# Patient Record
Sex: Female | Born: 1995 | Race: Black or African American | Hispanic: No | Marital: Single | Smoking: Former smoker
Health system: Southern US, Community
[De-identification: ages and names within clinical notes are randomized; demographics above are authoritative.]

## PROBLEM LIST (undated history)

## (undated) DIAGNOSIS — B999 Unspecified infectious disease: Secondary | ICD-10-CM

## (undated) DIAGNOSIS — B029 Zoster without complications: Secondary | ICD-10-CM

## (undated) DIAGNOSIS — R519 Headache, unspecified: Secondary | ICD-10-CM

## (undated) DIAGNOSIS — L659 Nonscarring hair loss, unspecified: Secondary | ICD-10-CM

## (undated) DIAGNOSIS — A599 Trichomoniasis, unspecified: Secondary | ICD-10-CM

## (undated) DIAGNOSIS — J45909 Unspecified asthma, uncomplicated: Secondary | ICD-10-CM

## (undated) HISTORY — PX: OTHER SURGICAL HISTORY: SHX169

## (undated) HISTORY — PX: NO PAST SURGERIES: SHX2092

---

## 2019-02-19 ENCOUNTER — Other Ambulatory Visit: Payer: Self-pay

## 2019-02-19 ENCOUNTER — Encounter (HOSPITAL_COMMUNITY): Payer: Self-pay

## 2019-02-19 ENCOUNTER — Ambulatory Visit (HOSPITAL_COMMUNITY)
Admission: EM | Admit: 2019-02-19 | Discharge: 2019-02-19 | Disposition: A | Discharge status: Home / Self Care | Payer: Self-pay | Attending: Family Medicine | Admitting: Family Medicine

## 2019-02-19 DIAGNOSIS — R300 Dysuria: Secondary | ICD-10-CM | POA: Insufficient documentation

## 2019-02-19 DIAGNOSIS — N760 Acute vaginitis: Secondary | ICD-10-CM | POA: Insufficient documentation

## 2019-02-19 DIAGNOSIS — Z760 Encounter for issue of repeat prescription: Secondary | ICD-10-CM | POA: Insufficient documentation

## 2019-02-19 HISTORY — DX: Zoster without complications: B02.9

## 2019-02-19 HISTORY — DX: Unspecified asthma, uncomplicated: J45.909

## 2019-02-19 LAB — POCT URINALYSIS DIP (DEVICE)
Bilirubin Urine: NEGATIVE
Glucose, UA: NEGATIVE mg/dL
Ketones, ur: NEGATIVE mg/dL
Nitrite: NEGATIVE
Protein, ur: NEGATIVE mg/dL
Specific Gravity, Urine: 1.02 (ref 1.005–1.030)
Urobilinogen, UA: 0.2 mg/dL (ref 0.0–1.0)
pH: 7.5 (ref 5.0–8.0)

## 2019-02-19 LAB — POCT PREGNANCY, URINE: Preg Test, Ur: NEGATIVE

## 2019-02-19 MED ORDER — FLUCONAZOLE 150 MG PO TABS: 150.0000 mg | ORAL_TABLET | Freq: Once | ORAL | 0 refills | Status: AC

## 2019-02-19 MED ORDER — NITROFURANTOIN MONOHYD MACRO 100 MG PO CAPS: 100.0000 mg | ORAL_CAPSULE | Freq: Two times a day (BID) | ORAL | 0 refills | Status: AC

## 2019-02-19 MED ORDER — LORATADINE 10 MG PO TABS: 10.0000 mg | ORAL_TABLET | Freq: Every day | ORAL | 0 refills | Status: DC

## 2019-02-19 MED ORDER — MONTELUKAST SODIUM 10 MG PO TABS: 10.0000 mg | ORAL_TABLET | Freq: Every day | ORAL | 0 refills | Status: DC

## 2019-02-19 MED ORDER — VALACYCLOVIR HCL 1 G PO TABS: 1000.0000 mg | ORAL_TABLET | Freq: Three times a day (TID) | ORAL | 0 refills | Status: AC

## 2019-02-19 MED ORDER — ALBUTEROL SULFATE HFA 108 (90 BASE) MCG/ACT IN AERS: 1.0000 | INHALATION_SPRAY | Freq: Four times a day (QID) | RESPIRATORY_TRACT | 0 refills | Status: DC | PRN

## 2019-02-19 NOTE — ED Triage Notes (Signed)
Pt c/o urinary difficulty, burning and frequency x2 days, requesting a preg test also.

## 2019-02-19 NOTE — ED Provider Notes (Signed)
[facility]    CSN: [id number] Arrival date & time: 02/19/19  1324     History   Chief Complaint Chief Complaint  Patient presents with  . Urinary Tract Infection    HPI AMARIA Graham is a 23 y.o. female history of asthma, recurrent shingles, presenting today for evaluation of possible UTI.  Patient states that over the past couple days she has had burning and frequency with urination.  She also has had a lot of irritation noted and some vaginal discharge.  She initially thought her symptoms were from a yeast infection.  She tried a medicated douche which worsened her symptoms.  She also tried Monistat over-the-counter last night.  She has had some mild lower abdominal discomfort that has been off and on recently.  Denies any fever, nausea vomiting or back pain.  Denies history of BV.  Does have concern for STDs.  Last menstrual period was 4/1.  Is not on any form of birth control.  Patient is also requesting medication refills for Valtrex that she states that since she has been pregnant she has had recurrent shingles monthly.  She typically takes Valtrex for this.  She is also requesting refill for her asthma/allergies of albuterol, Claritin and Singulair.  She does not have a PCP.  She is concerned that she is going on a work trip out of town for the next 9 days.  She denies current fevers, chills, body aches, shortness of breath or cough.  HPI  Past Medical History:  Diagnosis Date  . Asthma   . Shingles     There are no active problems to display for this patient.   History reviewed. No pertinent surgical history.  OB History   No obstetric history on file.      Home Medications    Prior to Admission medications   Medication Sig Start Date End Date Taking? Authorizing Provider  albuterol (VENTOLIN HFA) 108 (90 Base) MCG/ACT inhaler Inhale 1-2 puffs into the lungs every 6 (six) hours as needed for wheezing or shortness of breath. 02/19/19   Wieters, Hallie C,  PA-C  fluconazole (DIFLUCAN) 150 MG tablet Take 1 tablet (150 mg total) by mouth once for 1 dose. 02/19/19 02/19/19  Wieters, Hallie C, PA-C  loratadine (CLARITIN) 10 MG tablet Take 1 tablet (10 mg total) by mouth daily. 02/19/19   Wieters, Hallie C, PA-C  montelukast (SINGULAIR) 10 MG tablet Take 1 tablet (10 mg total) by mouth at bedtime. 02/19/19   Wieters, Hallie C, PA-C  nitrofurantoin, macrocrystal-monohydrate, (MACROBID) 100 MG capsule Take 1 capsule (100 mg total) by mouth 2 (two) times daily for 5 days. 02/19/19 02/24/19  Wieters, Hallie C, PA-C  valACYclovir (VALTREX) 1000 MG tablet Take 1 tablet (1,000 mg total) by mouth 3 (three) times daily for 14 days. 02/19/19 03/05/19  Wieters, Junius Creamer, PA-C    Family History No family history on file.  Social History Social History   Tobacco Use  . Smoking status: Current Some Day Smoker  . Smokeless tobacco: Never Used  Substance Use Topics  . Alcohol use: Yes  . Drug use: Not on file     Allergies   Patient has no known allergies.   Review of Systems Review of Systems  Constitutional: Negative for fever.  Respiratory: Negative for shortness of breath.   Cardiovascular: Negative for chest pain.  Gastrointestinal: Positive for abdominal pain. Negative for diarrhea, nausea and vomiting.  Genitourinary: Positive for dysuria and vaginal discharge. Negative for  flank pain, genital sores, hematuria, menstrual problem, vaginal bleeding and vaginal pain.  Musculoskeletal: Negative for back pain.  Skin: Negative for rash.  Neurological: Negative for dizziness, light-headedness and headaches.     Physical Exam Triage Vital Signs ED Triage Vitals  Enc Vitals Group     BP 02/19/19 0945 128/79     Pulse Rate 02/19/19 0945 85     Resp --      Temp 02/19/19 0945 98.1 F (36.7 C)     Temp Source 02/19/19 0945 Oral     SpO2 02/19/19 0945 100 %     Weight --      Height --      Head Circumference --      Peak Flow --      Pain Score  02/19/19 0946 6     Pain Loc --      Pain Edu? --      Excl. in GC? --    No data found.  Updated Vital Signs BP 128/79 (BP Location: Left Arm)   Pulse 85   Temp 98.1 F (36.7 C) (Oral)   LMP 02/03/2019   SpO2 100%   Visual Acuity Right Eye Distance:   Left Eye Distance:   Bilateral Distance:    Right Eye Near:   Left Eye Near:    Bilateral Near:     Physical Exam Vitals signs and nursing note reviewed.  Constitutional:      General: She is not in acute distress.    Appearance: She is well-developed.  HENT:     Head: Normocephalic and atraumatic.  Eyes:     Conjunctiva/sclera: Conjunctivae normal.  Neck:     Musculoskeletal: Neck supple.  Cardiovascular:     Rate and Rhythm: Normal rate and regular rhythm.     Heart sounds: No murmur.  Pulmonary:     Effort: Pulmonary effort is normal. No respiratory distress.     Breath sounds: Normal breath sounds.     Comments: Breathing comfortably at rest, CTABL, no wheezing, rales or other adventitious sounds auscultated Abdominal:     Palpations: Abdomen is soft.     Tenderness: There is no abdominal tenderness.     Comments: Soft, nondistended, striae present, nontender to light and deep palpation throughout all 4 quadrants of abdomen and suprapubic area No CVA tenderness  Skin:    General: Skin is warm and dry.  Neurological:     Mental Status: She is alert.      UC Treatments / Results  Labs (all labs ordered are listed, but only abnormal results are displayed) Labs Reviewed  POCT URINALYSIS DIP (DEVICE) - Abnormal; Notable for the following components:      Result Value   Hgb urine dipstick TRACE (*)    Leukocytes,Ua LARGE (*)    All other components within normal limits  URINE CULTURE  POCT PREGNANCY, URINE  CERVICOVAGINAL ANCILLARY ONLY    EKG None  Radiology No results found.  Procedures Procedures (including critical care time)  Medications Ordered in UC Medications - No data to display   Initial Impression / Assessment and Plan / UC Course  I have reviewed the triage vital signs and the nursing notes.  Pertinent labs & imaging results that were available during my care of the patient were reviewed by me and considered in my medical decision making (see chart for details).     Large leuks on UA, will treat for UTI with Macrobid twice daily x5 days.  Will also provide Diflucan to treat for yeast infection.  Vaginal swab obtained and will send off to confirm causes of discharge as well as check for STDs.  Will call patient with results and alter treatment as needed.  Urine culture obtained.  Refilled patient's Singulair, Claritin, albuterol as well as Valtrex.  Discussed strict return precautions. Patient verbalized understanding and is agreeable with plan.  Final Clinical Impressions(s) / UC Diagnoses   Final diagnoses:  Dysuria  Vaginitis and vulvovaginitis  Medication refill     Discharge Instructions     Please begin taking Macrobid twice daily to treat for UTI.  Will send urine off for culture to confirm and alter antibiotics if needed.  You may take 1 tablet of Diflucan today, may repeat in a few days/after finishing antibiotics if still having symptoms.  We are sending the swab off to check for yeast, BV, gonorrhea, chlamydia and trichomonas.  We will call you with these results.  Please drink plenty of fluids  Follow-up if developing worsening symptoms, persistent symptoms   ED Prescriptions    Medication Sig Dispense Auth. Provider   nitrofurantoin, macrocrystal-monohydrate, (MACROBID) 100 MG capsule Take 1 capsule (100 mg total) by mouth 2 (two) times daily for 5 days. 10 capsule Wieters, Hallie C, PA-C   fluconazole (DIFLUCAN) 150 MG tablet Take 1 tablet (150 mg total) by mouth once for 1 dose. 2 tablet Wieters, Hallie C, PA-C   albuterol (VENTOLIN HFA) 108 (90 Base) MCG/ACT inhaler Inhale 1-2 puffs into the lungs every 6 (six) hours as needed for  wheezing or shortness of breath. 1 Inhaler Wieters, Hallie C, PA-C   montelukast (SINGULAIR) 10 MG tablet Take 1 tablet (10 mg total) by mouth at bedtime. 30 tablet Wieters, Hallie C, PA-C   loratadine (CLARITIN) 10 MG tablet Take 1 tablet (10 mg total) by mouth daily. 30 tablet Wieters, Hallie C, PA-C   valACYclovir (VALTREX) 1000 MG tablet Take 1 tablet (1,000 mg total) by mouth 3 (three) times daily for 14 days. 42 tablet Wieters, [place] C, PA-C     Controlled Substance Prescriptions [place] Controlled Substance Registry consulted? Not Applicable   Lew Dawes, [place] 02/19/19 1039

## 2019-02-19 NOTE — Discharge Instructions (Signed)
Please begin taking Macrobid twice daily to treat for UTI.  Will send urine off for culture to confirm and alter antibiotics if needed.  You may take 1 tablet of Diflucan today, may repeat in a few days/after finishing antibiotics if still having symptoms.  We are sending the swab off to check for yeast, BV, gonorrhea, chlamydia and trichomonas.  We will call you with these results.  Please drink plenty of fluids  Follow-up if developing worsening symptoms, persistent symptoms

## 2019-02-21 LAB — URINE CULTURE: Culture: 50000 — AB

## 2019-02-22 ENCOUNTER — Telehealth (HOSPITAL_COMMUNITY): Payer: Self-pay | Admitting: Emergency Medicine

## 2019-02-22 LAB — CERVICOVAGINAL ANCILLARY ONLY
Bacterial vaginitis: NEGATIVE
Candida vaginitis: POSITIVE — AB
Chlamydia: NEGATIVE
Neisseria Gonorrhea: NEGATIVE
Trichomonas: NEGATIVE

## 2019-02-22 NOTE — Telephone Encounter (Signed)
Urine culture was positive for e coli  and was given macrobid  at urgent care visit.  Attempted to reach patient. No answer at this time. Number not in service

## 2019-07-14 ENCOUNTER — Encounter: Payer: Self-pay | Admitting: Obstetrics

## 2019-07-14 ENCOUNTER — Ambulatory Visit (INDEPENDENT_AMBULATORY_CARE_PROVIDER_SITE_OTHER): Payer: BC Managed Care – PPO | Admitting: *Deleted

## 2019-07-14 ENCOUNTER — Other Ambulatory Visit: Payer: Self-pay

## 2019-07-14 DIAGNOSIS — Z3201 Encounter for pregnancy test, result positive: Secondary | ICD-10-CM

## 2019-07-14 DIAGNOSIS — Z3200 Encounter for pregnancy test, result unknown: Secondary | ICD-10-CM

## 2019-07-14 MED ORDER — ALBUTEROL SULFATE HFA 108 (90 BASE) MCG/ACT IN AERS: 1.0000 | INHALATION_SPRAY | Freq: Four times a day (QID) | RESPIRATORY_TRACT | 0 refills | Status: DC | PRN

## 2019-07-14 NOTE — Progress Notes (Addendum)
[name] presents today for UPT. She has no unusual complaints. LMP:06/04/2019    OBJECTIVE: Appears well, in no apparent distress.  OB History   No obstetric history on file.    Home UPT Result: Positive In-Office UPT result:Positive   ASSESSMENT: Positive pregnancy test  PLAN Prenatal care to be completed at:  [facility] Albuterol inhaler refilled today per Dr Eileen Graham approval.

## 2019-07-14 NOTE — Progress Notes (Signed)
Patient seen and assessed by nursing staff during this encounter. I have reviewed the chart and agree with the documentation and plan.  Mora Bellman, MD 07/14/2019 12:55 PM

## 2019-07-14 NOTE — Addendum Note (Signed)
Addended by: Lewie Loron D on: 07/14/2019 03:34 PM   Modules accepted: Orders

## 2019-07-19 ENCOUNTER — Ambulatory Visit (INDEPENDENT_AMBULATORY_CARE_PROVIDER_SITE_OTHER)
Admission: EM | Admit: 2019-07-19 | Discharge: 2019-07-19 | Disposition: A | Discharge status: Home / Self Care | Payer: BC Managed Care – PPO | Source: Home / Self Care

## 2019-07-19 ENCOUNTER — Emergency Department (HOSPITAL_COMMUNITY)
Admission: EM | Admit: 2019-07-19 | Discharge: 2019-07-19 | Disposition: A | Discharge status: Home / Self Care | Payer: BC Managed Care – PPO

## 2019-07-19 ENCOUNTER — Inpatient Hospital Stay (HOSPITAL_COMMUNITY)
Admission: EM | Admit: 2019-07-19 | Discharge: 2019-07-19 | Disposition: A | Discharge status: Home / Self Care | Payer: BC Managed Care – PPO | Attending: Obstetrics and Gynecology | Admitting: Obstetrics and Gynecology

## 2019-07-19 ENCOUNTER — Encounter (HOSPITAL_COMMUNITY): Payer: Self-pay

## 2019-07-19 ENCOUNTER — Encounter (HOSPITAL_COMMUNITY): Payer: Self-pay | Admitting: *Deleted

## 2019-07-19 ENCOUNTER — Inpatient Hospital Stay (HOSPITAL_COMMUNITY): Payer: BC Managed Care – PPO

## 2019-07-19 ENCOUNTER — Other Ambulatory Visit: Payer: Self-pay

## 2019-07-19 DIAGNOSIS — R1030 Lower abdominal pain, unspecified: Secondary | ICD-10-CM | POA: Diagnosis present

## 2019-07-19 DIAGNOSIS — O99331 Smoking (tobacco) complicating pregnancy, first trimester: Secondary | ICD-10-CM | POA: Diagnosis not present

## 2019-07-19 DIAGNOSIS — F17210 Nicotine dependence, cigarettes, uncomplicated: Secondary | ICD-10-CM | POA: Insufficient documentation

## 2019-07-19 DIAGNOSIS — O99351 Diseases of the nervous system complicating pregnancy, first trimester: Secondary | ICD-10-CM | POA: Diagnosis not present

## 2019-07-19 DIAGNOSIS — N760 Acute vaginitis: Secondary | ICD-10-CM | POA: Diagnosis present

## 2019-07-19 DIAGNOSIS — G4709 Other insomnia: Secondary | ICD-10-CM | POA: Insufficient documentation

## 2019-07-19 DIAGNOSIS — Z3490 Encounter for supervision of normal pregnancy, unspecified, unspecified trimester: Secondary | ICD-10-CM

## 2019-07-19 DIAGNOSIS — Z3A01 Less than 8 weeks gestation of pregnancy: Secondary | ICD-10-CM | POA: Insufficient documentation

## 2019-07-19 DIAGNOSIS — O26899 Other specified pregnancy related conditions, unspecified trimester: Secondary | ICD-10-CM

## 2019-07-19 DIAGNOSIS — Z3201 Encounter for pregnancy test, result positive: Secondary | ICD-10-CM

## 2019-07-19 DIAGNOSIS — O210 Mild hyperemesis gravidarum: Secondary | ICD-10-CM | POA: Diagnosis not present

## 2019-07-19 DIAGNOSIS — R109 Unspecified abdominal pain: Secondary | ICD-10-CM

## 2019-07-19 DIAGNOSIS — B9689 Other specified bacterial agents as the cause of diseases classified elsewhere: Secondary | ICD-10-CM | POA: Diagnosis not present

## 2019-07-19 DIAGNOSIS — O23591 Infection of other part of genital tract in pregnancy, first trimester: Secondary | ICD-10-CM | POA: Insufficient documentation

## 2019-07-19 DIAGNOSIS — G4700 Insomnia, unspecified: Secondary | ICD-10-CM | POA: Diagnosis present

## 2019-07-19 LAB — CBC
HCT: 36.3 % (ref 36.0–46.0)
Hemoglobin: 12.3 g/dL (ref 12.0–15.0)
MCH: 32.7 pg (ref 26.0–34.0)
MCHC: 33.9 g/dL (ref 30.0–36.0)
MCV: 96.5 fL (ref 80.0–100.0)
Platelets: 177 10*3/uL (ref 150–400)
RBC: 3.76 MIL/uL — ABNORMAL LOW (ref 3.87–5.11)
RDW: 11.9 % (ref 11.5–15.5)
WBC: 8.2 10*3/uL (ref 4.0–10.5)
nRBC: 0 % (ref 0.0–0.2)

## 2019-07-19 LAB — WET PREP, GENITAL
Sperm: NONE SEEN
Trich, Wet Prep: NONE SEEN
Yeast Wet Prep HPF POC: NONE SEEN

## 2019-07-19 LAB — HCG, QUANTITATIVE, PREGNANCY: hCG, Beta Chain, Quant, S: 42128 m[IU]/mL — ABNORMAL HIGH (ref <5)

## 2019-07-19 LAB — ABO/RH: ABO/RH(D): O POS

## 2019-07-19 LAB — POCT PREGNANCY, URINE: Preg Test, Ur: POSITIVE — AB

## 2019-07-19 MED ORDER — METRONIDAZOLE 500 MG PO TABS: 500.0000 mg | ORAL_TABLET | Freq: Two times a day (BID) | ORAL | 0 refills | Status: AC

## 2019-07-19 MED ORDER — PROMETHAZINE HCL 25 MG PO TABS: 25.0000 mg | ORAL_TABLET | Freq: Four times a day (QID) | ORAL | 3 refills | Status: DC | PRN

## 2019-07-19 MED ORDER — MAGNESIUM 200 MG PO TABS: 400.0000 mg | ORAL_TABLET | Freq: Every day | ORAL | 1 refills | Status: DC

## 2019-07-19 NOTE — ED Triage Notes (Signed)
Pt presents with generalized abdominal cramping and sleep insomnia; pt may possibly be pregnant as well.

## 2019-07-19 NOTE — MAU Provider Note (Signed)
History     CSN: [id number]  Arrival date and time: 07/19/19 1157   First Provider Initiated Contact with Patient 07/19/19 1253      Chief Complaint  Patient presents with  . Abdominal Pain   HPI  Ms.  Eileen Graham is a 23 y.o. year old G63P1011 female at [redacted]w[redacted]d weeks gestation by LMP who was sent to MAU from [facility] reporting consistent lower abdominal pain for 5 days. She also complains of "not being able to sleep because so uncomfortable". She has tried taking Tylenol PM and Melatonin, but "nothing works. I keep waking up in the middle of the night." She has also tried CBD oil and that did not help. She states, "I asked my OB office about the pain and they said it was normal, but I've been pregnant before and this can't be normal." She denies abnormal vaginal discharge. She reports las SI was 4 days ago.  Past Medical History:  Diagnosis Date  . Asthma   . Shingles     History reviewed. No pertinent surgical history.  Family History  Problem Relation Age of Onset  . Asthma Maternal Grandfather   . Congestive Heart Failure Maternal Grandfather     Social History   Tobacco Use  . Smoking status: Current Some Day Smoker    Types: Cigarettes  . Smokeless tobacco: Never Used  Substance Use Topics  . Alcohol use: Not Currently  . Drug use: Yes    Types: Marijuana    Comment: August 2020    Allergies: No Known Allergies  Medications Prior to Admission  Medication Sig Dispense Refill Last Dose  . albuterol (VENTOLIN HFA) 108 (90 Base) MCG/ACT inhaler Inhale 1-2 puffs into the lungs every 6 (six) hours as needed for wheezing or shortness of breath. 8 g 0 Past Month at Unknown time  . prenatal vitamin w/FE, FA (PRENATAL 1 + 1) 27-1 MG TABS tablet Take 1 tablet by mouth daily at 12 noon.   Past Week at Unknown time  . loratadine (CLARITIN) 10 MG tablet Take 1 tablet (10 mg total) by mouth daily. 30 tablet 0   . montelukast (SINGULAIR) 10 MG tablet Take 1 tablet (10 mg total)  by mouth at bedtime. (Patient not taking: Reported on 07/14/2019) 30 tablet 0     Review of Systems  Constitutional: Negative.   HENT: Negative.   Eyes: Negative.   Respiratory: Negative.   Cardiovascular: Negative.   Gastrointestinal: Negative.   Endocrine: Negative.   Genitourinary: Positive for pelvic pain.  Musculoskeletal: Negative.   Skin: Negative.   Allergic/Immunologic: Negative.   Neurological: Negative.   Hematological: Negative.   Psychiatric/Behavioral: Positive for sleep disturbance (insomnia).   Physical Exam   Blood pressure 138/66, pulse 83, temperature 98.1 F (36.7 C), temperature source Oral, resp. rate 16, height 5\' 3"  (1.6 m), weight 63.1 kg, last menstrual period 06/03/2019, SpO2 97 %.  Physical Exam  Nursing note and vitals reviewed. Constitutional: She is oriented to person, place, and time. She appears well-developed and well-nourished.  HENT:  Head: Normocephalic and atraumatic.  Eyes: Pupils are equal, round, and reactive to light.  Neck: Normal range of motion.  Cardiovascular: Normal rate.  Respiratory: Effort normal.  GI: Soft.  Genitourinary:    Genitourinary Comments: Uterus: non-tender, SE: cervix is smooth, pink, no lesions, moderate amt of thick, white vaginal d/c -- WP, GC/CT done, closed/long/firm, no CMT or friability, mild RT adnexal tenderness    Musculoskeletal: Normal range of motion.  Neurological: She is alert and oriented to person, place, and time. She has normal reflexes.  Skin: Skin is warm and dry.  Psychiatric: She has a normal mood and affect. Her behavior is normal. Judgment and thought content normal.    MAU Course  Procedures  MDM [facility] UPT CBC ABO/Rh HCG Wet Prep GC/CT -- pending HIV -- pending OB < 14 wks US with TV  Results for orders placed or performed during the hospital encounter of 07/19/19 (from the past 24 hour(s))  hCG, quantitative, pregnancy     Status: Abnormal   Collection Time: 07/19/19  1:00  PM  Result Value Ref Range   hCG, Beta Chain, Quant, S 42,128 (H) <5 mIU/mL  CBC     Status: Abnormal   Collection Time: 07/19/19  1:05 PM  Result Value Ref Range   WBC 8.2 4.0 - 10.5 K/uL   RBC 3.76 (L) 3.87 - 5.11 MIL/uL   Hemoglobin 12.3 12.0 - 15.0 g/dL   HCT 82.936.3 56.236.0 - 13.046.0 %   MCV 96.5 80.0 - 100.0 fL   MCH 32.7 26.0 - 34.0 pg   MCHC 33.9 30.0 - 36.0 g/dL   RDW 86.511.9 78.411.5 - 69.615.5 %   Platelets 177 150 - 400 K/uL   nRBC 0.0 0.0 - 0.2 %  ABO/Rh     Status: None   Collection Time: 07/19/19  1:05 PM  Result Value Ref Range   ABO/RH(D)      O POS Performed at [facility], [address] [place], [place] [address]   Wet prep, genital     Status: Abnormal   Collection Time: 07/19/19  1:08 PM   Specimen: Cervical/Vaginal swab  Result Value Ref Range   Yeast Wet Prep HPF POC NONE SEEN NONE SEEN   Trich, Wet Prep NONE SEEN NONE SEEN   Clue Cells Wet Prep HPF POC PRESENT (A) NONE SEEN   WBC, Wet Prep HPF POC MANY (A) NONE SEEN   Sperm NONE SEEN     [place] Ob Less Than 14 Weeks With Ob Transvaginal  Result Date: 07/19/2019 CLINICAL DATA:  Abdominal pain for 5 days. First trimester pregnancy. EXAM: OBSTETRIC <14 WK US AND TRANSVAGINAL OB US TECHNIQUE: Both transabdominal and transvaginal ultrasound examinations were performed for complete evaluation of the gestation as well as the maternal uterus, adnexal regions, and pelvic cul-de-sac. Transvaginal technique was performed to assess early pregnancy. COMPARISON:  None. FINDINGS: Intrauterine gestational sac: Single Yolk sac:  Visualized. Embryo:  Visualized. Cardiac Activity: Visualized. Heart Rate: 114 bpm CRL:  3 mm   5 w   6 d                  US EDC: 03/14/2020 Subchorionic hemorrhage:  None visualized. Maternal uterus/adnexae: Normal appearance of both ovaries. No mass or abnormal free fluid identified. IMPRESSION: Single living IUP measuring 5 weeks 6 days, with US EDC of 03/14/2020. No significant maternal uterine or adnexal  abnormality identified. Electronically Signed   By: Danae OrleansJohn A Stahl M.D.   On: 07/19/2019 14:04     Assessment and Plan  Intrauterine pregnancy  - Reassurance that pregnancy is in uterus and developing normally on today's exam - Information provided on first trimester pregnancy - Advised to keep OB appt scheduled with [facility] on 08/10/19   Abdominal pain in pregnancy  - Advised to continue taking Tylenol 1000 mg prn pain - Information provided on abd pain in pregnancy and safe meds to take in  pregnancy   Morning sickness - Rx for Phenergan 25 mg po every 6 hours prn N/V - Information provided on morning sickness  Other insomnia  - Rx for Magnesium 400 mg po hs for insomnia - Information provided on insomnia   Bacterial Vaginosis - Rx for Flagyl 500 mg BID x 7 days - Explained to patient that BV can cause lower abdominal cramping in early pregnancy  - Discharge patient - Patient verbalized an understanding of the plan of care and agrees.     Laury Deep, MSN, CNM 07/19/2019, 12:53 PM

## 2019-07-19 NOTE — MAU Note (Signed)
Presents with abdominal pain that began 5 days ago.  Denies VB.  LMP 06/03/2019.

## 2019-07-19 NOTE — Discharge Instructions (Signed)

## 2019-07-19 NOTE — ED Notes (Signed)
[facility] is being [facility] and sent to the Emergency Department via personal vehicle by family member. Per Provider Augusto Gamble, patient is stable but in need of higher level of care due to pregnancy and abdominal cramping. Patient is aware and verbalizes understanding of plan of care.   Vitals:   07/19/19 1124  BP: 126/87  Pulse: 70  Resp: 16  Temp: 98.1 F (36.7 C)  SpO2: 100%

## 2019-07-20 LAB — GC/CHLAMYDIA PROBE AMP (~~LOC~~) NOT AT ARMC
Chlamydia: NEGATIVE
Neisseria Gonorrhea: NEGATIVE

## 2019-07-20 LAB — HIV ANTIBODY (ROUTINE TESTING W REFLEX): HIV Screen 4th Generation wRfx: NONREACTIVE

## 2019-08-07 ENCOUNTER — Other Ambulatory Visit: Payer: Self-pay

## 2019-08-07 ENCOUNTER — Encounter (HOSPITAL_COMMUNITY): Payer: Self-pay | Admitting: *Deleted

## 2019-08-07 ENCOUNTER — Inpatient Hospital Stay (HOSPITAL_COMMUNITY)
Admission: AD | Admit: 2019-08-07 | Discharge: 2019-08-07 | Disposition: A | Discharge status: Home / Self Care | Payer: BC Managed Care – PPO | Attending: Obstetrics and Gynecology | Admitting: Obstetrics and Gynecology

## 2019-08-07 DIAGNOSIS — O99331 Smoking (tobacco) complicating pregnancy, first trimester: Secondary | ICD-10-CM | POA: Insufficient documentation

## 2019-08-07 DIAGNOSIS — F17210 Nicotine dependence, cigarettes, uncomplicated: Secondary | ICD-10-CM | POA: Insufficient documentation

## 2019-08-07 DIAGNOSIS — O211 Hyperemesis gravidarum with metabolic disturbance: Secondary | ICD-10-CM | POA: Insufficient documentation

## 2019-08-07 DIAGNOSIS — Z3A09 9 weeks gestation of pregnancy: Secondary | ICD-10-CM | POA: Diagnosis not present

## 2019-08-07 DIAGNOSIS — O210 Mild hyperemesis gravidarum: Secondary | ICD-10-CM | POA: Diagnosis present

## 2019-08-07 DIAGNOSIS — E860 Dehydration: Secondary | ICD-10-CM

## 2019-08-07 DIAGNOSIS — O219 Vomiting of pregnancy, unspecified: Secondary | ICD-10-CM

## 2019-08-07 LAB — URINALYSIS, ROUTINE W REFLEX MICROSCOPIC
Bilirubin Urine: NEGATIVE
Glucose, UA: NEGATIVE mg/dL
Hgb urine dipstick: NEGATIVE
Ketones, ur: 80 mg/dL — AB
Leukocytes,Ua: NEGATIVE
Nitrite: NEGATIVE
Protein, ur: 100 mg/dL — AB
Specific Gravity, Urine: 1.034 — ABNORMAL HIGH (ref 1.005–1.030)
pH: 6 (ref 5.0–8.0)

## 2019-08-07 MED ORDER — GLYCOPYRROLATE 2 MG PO TABS: 2.0000 mg | ORAL_TABLET | Freq: Three times a day (TID) | ORAL | 3 refills | Status: DC | PRN

## 2019-08-07 MED ORDER — SCOPOLAMINE 1 MG/3DAYS TD PT72
1.0000 | MEDICATED_PATCH | TRANSDERMAL | Status: DC
  Administered 2019-08-07: 1.5 mg via TRANSDERMAL
  Filled 2019-08-07: qty 1

## 2019-08-07 MED ORDER — GLYCOPYRROLATE 1 MG PO TABS
2.0000 mg | ORAL_TABLET | Freq: Once | ORAL | Status: AC
  Administered 2019-08-07: 2 mg via ORAL
  Filled 2019-08-07: qty 2

## 2019-08-07 MED ORDER — SCOPOLAMINE 1 MG/3DAYS TD PT72: 1.0000 | MEDICATED_PATCH | TRANSDERMAL | 12 refills | Status: DC

## 2019-08-07 NOTE — MAU Note (Signed)
Pt not in lobby.  

## 2019-08-07 NOTE — MAU Provider Note (Signed)
Chief Complaint: Nausea and Emesis   First Provider Initiated Contact with Patient 08/07/19 2131      SUBJECTIVE HPI: Eileen Graham is a 23 y.o. G3P1011 at [redacted]w[redacted]d by LMP who presents to maternity admissions reporting nausea with vomiting 6 times in 24 hours. She reports she cannot keep anything down. She is drinking full glasses of water because she is so thirsty but then she throws up immediately. She cannot keep down her Phenergan tablets.  There are no other symptoms. She has not tried any other treatments.  She denies abdominal pain or vaginal bleeding.     HPI  Past Medical History:  Diagnosis Date  . Asthma   . Shingles    History reviewed. No pertinent surgical history. Social History   Socioeconomic History  . Marital status: Single    Spouse name: Not on file  . Number of children: Not on file  . Years of education: Not on file  . Highest education level: Not on file  Occupational History  . Not on file  Social Needs  . Financial resource strain: Not on file  . Food insecurity    Worry: Not on file    Inability: Not on file  . Transportation needs    Medical: Not on file    Non-medical: Not on file  Tobacco Use  . Smoking status: Current Some Day Smoker    Types: Cigarettes  . Smokeless tobacco: Never Used  Substance and Sexual Activity  . Alcohol use: Not Currently  . Drug use: Yes    Types: Marijuana    Comment: August 2020  . Sexual activity: Yes  Lifestyle  . Physical activity    Days per week: Not on file    Minutes per session: Not on file  . Stress: Not on file  Relationships  . Social Herbalist on phone: Not on file    Gets together: Not on file    Attends religious service: Not on file    Active member of club or organization: Not on file    Attends meetings of clubs or organizations: Not on file    Relationship status: Not on file  . Intimate partner violence    Fear of current or ex partner: Not on file    Emotionally abused:  Not on file    Physically abused: Not on file    Forced sexual activity: Not on file  Other Topics Concern  . Not on file  Social History Narrative  . Not on file   No current facility-administered medications on file prior to encounter.    Current Outpatient Medications on File Prior to Encounter  Medication Sig Dispense Refill  . albuterol (VENTOLIN HFA) 108 (90 Base) MCG/ACT inhaler Inhale 1-2 puffs into the lungs every 6 (six) hours as needed for wheezing or shortness of breath. 8 g 0  . loratadine (CLARITIN) 10 MG tablet Take 1 tablet (10 mg total) by mouth daily. 30 tablet 0  . Magnesium 200 MG TABS Take 2 tablets (400 mg total) by mouth at bedtime. 60 tablet 1  . montelukast (SINGULAIR) 10 MG tablet Take 1 tablet (10 mg total) by mouth at bedtime. (Patient not taking: Reported on 07/14/2019) 30 tablet 0  . prenatal vitamin w/FE, FA (PRENATAL 1 + 1) 27-1 MG TABS tablet Take 1 tablet by mouth daily at 12 noon.    . promethazine (PHENERGAN) 25 MG tablet Take 1 tablet (25 mg total) by mouth every 6 (  six) hours as needed for nausea or vomiting. 30 tablet 3   No Known Allergies  ROS:  Review of Systems  Constitutional: Negative for chills, fatigue and fever.  Respiratory: Negative for shortness of breath.   Cardiovascular: Negative for chest pain.  Gastrointestinal: Positive for nausea and vomiting. Negative for abdominal pain.  Genitourinary: Negative for difficulty urinating, dysuria, flank pain, pelvic pain, vaginal bleeding, vaginal discharge and vaginal pain.  Neurological: Negative for dizziness and headaches.  Psychiatric/Behavioral: Negative.      I have reviewed patient's Past Medical Hx, Surgical Hx, Family Hx, Social Hx, medications and allergies.   Physical Exam   Patient Vitals for the past 24 hrs:  BP Temp Temp src Pulse Resp SpO2 Height Weight  08/07/19 2217 (!) 115/56 99.2 F (37.3 C) Axillary 88 16 94 % - -  08/07/19 2038 120/69 98 F (36.7 C) Oral (!) 104 18  99 % 5\' 3"  (1.6 m) 61.5 kg   Constitutional: Well-developed, well-nourished female in no acute distress.  Cardiovascular: normal rate Respiratory: normal effort GI: Abd soft, non-tender. Pos BS x 4 MS: Extremities nontender, no edema, normal ROM Neurologic: Alert and oriented x 4.  GU: Neg CVAT.   LAB RESULTS Results for orders placed or performed during the hospital encounter of 08/07/19 (from the past 24 hour(s))  Urinalysis, Routine w reflex microscopic     Status: Abnormal   Collection Time: 08/07/19  9:14 PM  Result Value Ref Range   Color, Urine YELLOW YELLOW   APPearance HAZY (A) CLEAR   Specific Gravity, Urine 1.034 (H) 1.005 - 1.030   pH 6.0 5.0 - 8.0   Glucose, UA NEGATIVE NEGATIVE mg/dL   Hgb urine dipstick NEGATIVE NEGATIVE   Bilirubin Urine NEGATIVE NEGATIVE   Ketones, ur 80 (A) NEGATIVE mg/dL   Protein, ur 454100 (A) NEGATIVE mg/dL   Nitrite NEGATIVE NEGATIVE   Leukocytes,Ua NEGATIVE NEGATIVE   RBC / HPF 0-5 0 - 5 RBC/hpf   WBC, UA 6-10 0 - 5 WBC/hpf   Bacteria, UA RARE (A) NONE SEEN   Squamous Epithelial / LPF 11-20 0 - 5   Mucus PRESENT     --/--/O POS Performed at [facility], [address] [place], [place] [address]  [phone](09/14 1305)  IMAGING [place] Ob Less Than 14 Weeks With Ob Transvaginal  Result Date: 07/19/2019 CLINICAL DATA:  Abdominal pain for 5 days. First trimester pregnancy. EXAM: OBSTETRIC <14 WK US AND TRANSVAGINAL OB US TECHNIQUE: Both transabdominal and transvaginal ultrasound examinations were performed for complete evaluation of the gestation as well as the maternal uterus, adnexal regions, and pelvic cul-de-sac. Transvaginal technique was performed to assess early pregnancy. COMPARISON:  None. FINDINGS: Intrauterine gestational sac: Single Yolk sac:  Visualized. Embryo:  Visualized. Cardiac Activity: Visualized. Heart Rate: 114 bpm CRL:  3 mm   5 w   6 d                  US EDC: 03/14/2020 Subchorionic hemorrhage:  None visualized. Maternal  uterus/adnexae: Normal appearance of both ovaries. No mass or abnormal free fluid identified. IMPRESSION: Single living IUP measuring 5 weeks 6 days, with US EDC of 03/14/2020. No significant maternal uterine or adnexal abnormality identified. Electronically Signed   By: Danae OrleansJohn A Stahl M.D.   On: 07/19/2019 14:04    MAU Management/MDM: Orders Placed This Encounter  Procedures  . Urinalysis, Routine w reflex microscopic  . Discharge patient    Meds ordered this encounter  Medications  .  scopolamine (TRANSDERM-SCOP) 1 MG/3DAYS 1.5 mg  . glycopyrrolate (ROBINUL) tablet 2 mg  . scopolamine (TRANSDERM-SCOP) 1 MG/3DAYS    Sig: Place 1 patch (1.5 mg total) onto the skin every 3 (three) days.    Dispense:  10 patch    Refill:  12    Order Specific Question:   Supervising Provider    Answer:   CONSTANT, PEGGY [[id number]]  . glycopyrrolate (ROBINUL) 2 MG tablet    Sig: Take 1 tablet (2 mg total) by mouth 3 (three) times daily as needed.    Dispense:  30 tablet    Refill:  3    Order Specific Question:   Supervising Provider    Answer:   CONSTANT, PEGGY [[id number]]    Pt with 80 ketones c/w frequent vomiting and mild dehydration.  Pt declines IV treatment tonight, reporting she cannot stay due to childcare but could return tomorrow if needed. Discussed use of Phenergan vaginally or rectally PRN with pt.  Scopolamine patch applied and Robinul PO given in MAU. Pt tolerated PO fluids.  D/C home with Rx for Scopolamine and Robinul.  Pt to return to MAU if no improvement in 24 hours.  Encouraged pt to eat small frequent meals and drink sips of fluid all day, no large amounts at one time.  Pt states understanding.  Pt discharged with strict return precautions.  ASSESSMENT 1. Nausea and vomiting during pregnancy prior to [redacted] weeks gestation   2. Mild dehydration     PLAN Discharge home Allergies as of 08/07/2019   No Known Allergies     Medication List    TAKE these medications   albuterol 108 (90 Base)  MCG/ACT inhaler Commonly known as: VENTOLIN HFA Inhale 1-2 puffs into the lungs every 6 (six) hours as needed for wheezing or shortness of breath.   glycopyrrolate 2 MG tablet Commonly known as: ROBINUL Take 1 tablet (2 mg total) by mouth 3 (three) times daily as needed.   loratadine 10 MG tablet Commonly known as: CLARITIN Take 1 tablet (10 mg total) by mouth daily.   Magnesium 200 MG Tabs Take 2 tablets (400 mg total) by mouth at bedtime.   montelukast 10 MG tablet Commonly known as: SINGULAIR Take 1 tablet (10 mg total) by mouth at bedtime.   prenatal vitamin w/FE, FA 27-1 MG Tabs tablet Take 1 tablet by mouth daily at 12 noon.   promethazine 25 MG tablet Commonly known as: PHENERGAN Take 1 tablet (25 mg total) by mouth every 6 (six) hours as needed for nausea or vomiting.   scopolamine 1 MG/3DAYS Commonly known as: TRANSDERM-SCOP Place 1 patch (1.5 mg total) onto the skin every 3 (three) days. Start taking on: August 10, 2019      Follow-up Information    Your prenatal provider Follow up.   Why: Return to MAU as needed for emergencies.          Sharen Counter Certified Nurse-Midwife 08/07/2019  10:31 PM

## 2019-08-07 NOTE — MAU Note (Addendum)
Pt c/o N/V for 1 week. Reports she has vomited 4-5x in the last 24 hours. Reports she cannot keep anything down. Has promethazine RX, last took 4 days ago, but threw it back up. Pt also c/o SOB, thinks related to asthma. Has an inhaler, last used today a few hours ago, helped with SOB. Reports chills. Denies fever. Pt denies recent sick contacts.

## 2019-08-09 ENCOUNTER — Other Ambulatory Visit: Payer: Self-pay

## 2019-08-09 ENCOUNTER — Inpatient Hospital Stay (HOSPITAL_COMMUNITY)
Admission: AD | Admit: 2019-08-09 | Discharge: 2019-08-09 | Disposition: A | Discharge status: Home / Self Care | Payer: BC Managed Care – PPO | Attending: Obstetrics and Gynecology | Admitting: Obstetrics and Gynecology

## 2019-08-09 ENCOUNTER — Encounter (HOSPITAL_COMMUNITY): Payer: Self-pay | Admitting: *Deleted

## 2019-08-09 DIAGNOSIS — O26891 Other specified pregnancy related conditions, first trimester: Secondary | ICD-10-CM | POA: Insufficient documentation

## 2019-08-09 DIAGNOSIS — Z79899 Other long term (current) drug therapy: Secondary | ICD-10-CM | POA: Insufficient documentation

## 2019-08-09 DIAGNOSIS — O99511 Diseases of the respiratory system complicating pregnancy, first trimester: Secondary | ICD-10-CM | POA: Diagnosis not present

## 2019-08-09 DIAGNOSIS — J45909 Unspecified asthma, uncomplicated: Secondary | ICD-10-CM | POA: Diagnosis not present

## 2019-08-09 DIAGNOSIS — Z3A09 9 weeks gestation of pregnancy: Secondary | ICD-10-CM | POA: Insufficient documentation

## 2019-08-09 DIAGNOSIS — B9689 Other specified bacterial agents as the cause of diseases classified elsewhere: Secondary | ICD-10-CM | POA: Insufficient documentation

## 2019-08-09 DIAGNOSIS — E876 Hypokalemia: Secondary | ICD-10-CM | POA: Insufficient documentation

## 2019-08-09 DIAGNOSIS — G4709 Other insomnia: Secondary | ICD-10-CM | POA: Insufficient documentation

## 2019-08-09 DIAGNOSIS — O210 Mild hyperemesis gravidarum: Secondary | ICD-10-CM | POA: Insufficient documentation

## 2019-08-09 DIAGNOSIS — N760 Acute vaginitis: Secondary | ICD-10-CM | POA: Diagnosis not present

## 2019-08-09 DIAGNOSIS — R0602 Shortness of breath: Secondary | ICD-10-CM | POA: Insufficient documentation

## 2019-08-09 DIAGNOSIS — O99891 Other specified diseases and conditions complicating pregnancy: Secondary | ICD-10-CM | POA: Diagnosis not present

## 2019-08-09 DIAGNOSIS — O219 Vomiting of pregnancy, unspecified: Secondary | ICD-10-CM | POA: Diagnosis not present

## 2019-08-09 DIAGNOSIS — Z87891 Personal history of nicotine dependence: Secondary | ICD-10-CM | POA: Diagnosis not present

## 2019-08-09 DIAGNOSIS — F419 Anxiety disorder, unspecified: Secondary | ICD-10-CM

## 2019-08-09 DIAGNOSIS — Z3490 Encounter for supervision of normal pregnancy, unspecified, unspecified trimester: Secondary | ICD-10-CM

## 2019-08-09 DIAGNOSIS — F1290 Cannabis use, unspecified, uncomplicated: Secondary | ICD-10-CM

## 2019-08-09 HISTORY — DX: Nonscarring hair loss, unspecified: L65.9

## 2019-08-09 LAB — COMPREHENSIVE METABOLIC PANEL
ALT: 25 U/L (ref 0–44)
AST: 19 U/L (ref 15–41)
Albumin: 4.6 g/dL (ref 3.5–5.0)
Alkaline Phosphatase: 41 U/L (ref 38–126)
Anion gap: 16 — ABNORMAL HIGH (ref 5–15)
BUN: 8 mg/dL (ref 6–20)
CO2: 15 mmol/L — ABNORMAL LOW (ref 22–32)
Calcium: 9.7 mg/dL (ref 8.9–10.3)
Chloride: 102 mmol/L (ref 98–111)
Creatinine, Ser: 0.67 mg/dL (ref 0.44–1.00)
GFR calc Af Amer: >60 mL/min (ref >60)
GFR calc non Af Amer: >60 mL/min (ref >60)
Glucose, Bld: 86 mg/dL (ref 70–99)
Potassium: 3.1 mmol/L — ABNORMAL LOW (ref 3.5–5.1)
Sodium: 133 mmol/L — ABNORMAL LOW (ref 135–145)
Total Bilirubin: 0.9 mg/dL (ref 0.3–1.2)
Total Protein: 8.5 g/dL — ABNORMAL HIGH (ref 6.5–8.1)

## 2019-08-09 LAB — RAPID URINE DRUG SCREEN, HOSP PERFORMED
Amphetamines: NOT DETECTED
Barbiturates: NOT DETECTED
Benzodiazepines: NOT DETECTED
Cocaine: NOT DETECTED
Opiates: NOT DETECTED
Tetrahydrocannabinol: POSITIVE — AB

## 2019-08-09 LAB — URINALYSIS, ROUTINE W REFLEX MICROSCOPIC
Bilirubin Urine: NEGATIVE
Glucose, UA: NEGATIVE mg/dL
Hgb urine dipstick: NEGATIVE
Ketones, ur: 80 mg/dL — AB
Leukocytes,Ua: NEGATIVE
Nitrite: NEGATIVE
Protein, ur: 100 mg/dL — AB
Specific Gravity, Urine: 1.035 — ABNORMAL HIGH (ref 1.005–1.030)
pH: 6 (ref 5.0–8.0)

## 2019-08-09 MED ORDER — METOCLOPRAMIDE HCL 5 MG/ML IJ SOLN
10.0000 mg | Freq: Once | INTRAMUSCULAR | Status: AC
  Administered 2019-08-09: 11:00:00 10 mg via INTRAVENOUS
  Filled 2019-08-09: qty 2

## 2019-08-09 MED ORDER — LACTATED RINGERS IV BOLUS
1000.0000 mL | Freq: Once | INTRAVENOUS | Status: AC
  Administered 2019-08-09: 14:00:00 1000 mL via INTRAVENOUS

## 2019-08-09 MED ORDER — METOCLOPRAMIDE HCL 10 MG PO TABS: 10.0000 mg | ORAL_TABLET | Freq: Three times a day (TID) | ORAL | 0 refills | Status: DC

## 2019-08-09 MED ORDER — POTASSIUM CHLORIDE ER 10 MEQ PO TBCR: 10.0000 meq | EXTENDED_RELEASE_TABLET | Freq: Every day | ORAL | 0 refills | Status: DC

## 2019-08-09 MED ORDER — FAMOTIDINE 40 MG PO TABS: 40.0000 mg | ORAL_TABLET | Freq: Every evening | ORAL | 1 refills | Status: DC

## 2019-08-09 MED ORDER — FAMOTIDINE IN NACL 20-0.9 MG/50ML-% IV SOLN
20.0000 mg | Freq: Once | INTRAVENOUS | Status: AC
  Administered 2019-08-09: 11:00:00 20 mg via INTRAVENOUS
  Filled 2019-08-09: qty 50

## 2019-08-09 MED ORDER — LACTATED RINGERS IV BOLUS
1000.0000 mL | Freq: Once | INTRAVENOUS | Status: AC
  Administered 2019-08-09: 11:00:00 1000 mL via INTRAVENOUS

## 2019-08-09 NOTE — MAU Note (Signed)
Lab came to draw blood work and pt. Stated she does not want any blood work until provider tell her why she needs it.

## 2019-08-09 NOTE — MAU Note (Signed)
Presents with c/o nausea & vomiting x4 days, unable to keep anything down including water.  Reports feeling of weakness, fatigue, and SOB.  States has high anxiety due to not feeling well.

## 2019-08-09 NOTE — MAU Provider Note (Signed)
History     CSN: [id number]  Arrival date and time: 08/09/19 3546   First Provider Initiated Contact with Patient 08/09/19 [phone]      Chief Complaint  Patient presents with  . Emesis  . Nausea  . Fatigue   [name] is a 23 y.o. G3P1011 at [redacted]w[redacted]d who presents to MAU for N/V.  Onset: 1.5weeks ago, worsening over the past week Location: stomach Duration: 1.5weeks Character: constant nausea, vomited 10 times already today, pt unable to give average amount of times she vomits in 24hrs, pt reports she is able to sip lemon water and ginger ale, but "throws it right back up," "cannot remember the last time I ate," pt denies being able to eat or drink anything in the past 24hrs. Aggravating/Associated: smells/none Relieving: none Treatment: scopolamine patch in MAU on 08/07/2019, pt does not have it at this time and does not know when it fell off; pt reports she attempted to put Phenergan rectally on Saturday night but did not work, tried to take yesterday by mouth, but did not work  Pt also reports SOB, only before and after vomiting and if she "talks too much." Pt also reports her anxiety is "through the roof" and thinks this is the cause of her SOB. Pt reports SOB started "strongly like this" about 5-6days ago, but reports it started happening for the first time around 07/19/2019. Pt reports a hx of asthma and has an inhaler that she tried to use for her SOB, but reports that did not help.  Pt denies VB, LOF, ctx, vaginal discharge/odor/itching. Pt denies abdominal pain, constipation, diarrhea, or urinary problems. Pt denies fever, chills, fatigue, sweating or changes in appetite. Pt denies SOB or chest pain. Pt denies dizziness, HA, light-headedness, weakness.  Problems this pregnancy include: N/V. Allergies? NKDA Current medications/supplements? none Prenatal care provider? [place], next appt tomorrow for NOB   OB History    Gravida  3   Para  1   Term  1    Preterm      AB  1   Living  1     SAB      TAB  1   Ectopic      Multiple      Live Births  1           Past Medical History:  Diagnosis Date  . Alopecia   . Asthma   . Shingles     Past Surgical History:  Procedure Laterality Date  . NO PAST SURGERIES      Family History  Problem Relation Age of Onset  . Asthma Maternal Grandfather   . Congestive Heart Failure Maternal Grandfather     Social History   Tobacco Use  . Smoking status: Former Smoker    Types: Cigarettes  . Smokeless tobacco: Never Used  Substance Use Topics  . Alcohol use: Not Currently  . Drug use: Yes    Types: Marijuana    Comment: August 2020    Allergies: No Known Allergies  No medications prior to admission.    Review of Systems  Constitutional: Negative for chills, diaphoresis, fatigue and fever.  Eyes: Negative for visual disturbance.  Respiratory: Positive for shortness of breath.   Cardiovascular: Negative for chest pain.  Gastrointestinal: Positive for nausea and vomiting. Negative for abdominal pain, constipation and diarrhea.  Genitourinary: Negative for dysuria, flank pain, frequency, pelvic pain, urgency, vaginal bleeding and vaginal discharge.  Neurological: Negative for dizziness, weakness, light-headedness  and headaches.   Physical Exam   Blood pressure 125/66, pulse 84, temperature 98.5 F (36.9 C), temperature source Oral, resp. rate 18, height 5\' 3"  (1.6 m), weight 59.7 kg, last menstrual period 06/03/2019, SpO2 100 %.  Patient Vitals for the past 24 hrs:  BP Temp Temp src Pulse Resp SpO2 Height Weight  08/09/19 1657 125/66 98.5 F (36.9 C) Oral 84 - 100 % - -  08/09/19 1100 - - - - - 100 % - -  08/09/19 1000 - - - - - 99 % - -  08/09/19 0954 - - - - - 100 % - -  08/09/19 0940 - - - - - 100 % - -  08/09/19 0917 128/75 97.9 F (36.6 C) Oral (!) 111 18 100 % 5\' 3"  (1.6 m) 59.7 kg  08/09/19 0907 - - - - - 97 % - -   Physical Exam  Constitutional: She  is oriented to person, place, and time. She appears well-developed and well-nourished. No distress.  HENT:  Head: Normocephalic and atraumatic.  Respiratory: Effort normal and breath sounds normal. No respiratory distress. She has no wheezes. She has no rales. She exhibits no tenderness.  GI: Soft. She exhibits no distension and no mass. There is no abdominal tenderness. There is no rebound and no guarding.  Neurological: She is alert and oriented to person, place, and time.  Skin: Skin is warm and dry. She is not diaphoretic.  Psychiatric: She has a normal mood and affect. Her behavior is normal. Judgment and thought content normal.   Results for orders placed or performed during the hospital encounter of 08/09/19 (from the past 24 hour(s))  Urinalysis, Routine w reflex microscopic     Status: Abnormal   Collection Time: 08/09/19  9:54 AM  Result Value Ref Range   Color, Urine YELLOW YELLOW   APPearance HAZY (A) CLEAR   Specific Gravity, Urine 1.035 (H) 1.005 - 1.030   pH 6.0 5.0 - 8.0   Glucose, UA NEGATIVE NEGATIVE mg/dL   Hgb urine dipstick NEGATIVE NEGATIVE   Bilirubin Urine NEGATIVE NEGATIVE   Ketones, ur 80 (A) NEGATIVE mg/dL   Protein, ur 100 (A) NEGATIVE mg/dL   Nitrite NEGATIVE NEGATIVE   Leukocytes,Ua NEGATIVE NEGATIVE   RBC / HPF 0-5 0 - 5 RBC/hpf   WBC, UA 6-10 0 - 5 WBC/hpf   Bacteria, UA FEW (A) NONE SEEN   Squamous Epithelial / LPF 11-20 0 - 5   Mucus PRESENT   Urine rapid drug screen (hosp performed)     Status: Abnormal   Collection Time: 08/09/19  9:54 AM  Result Value Ref Range   Opiates NONE DETECTED NONE DETECTED   Cocaine NONE DETECTED NONE DETECTED   Benzodiazepines NONE DETECTED NONE DETECTED   Amphetamines NONE DETECTED NONE DETECTED   Tetrahydrocannabinol POSITIVE (A) NONE DETECTED   Barbiturates NONE DETECTED NONE DETECTED  Comprehensive metabolic panel     Status: Abnormal   Collection Time: 08/09/19 11:27 AM  Result Value Ref Range   Sodium 133  (L) 135 - 145 mmol/L   Potassium 3.1 (L) 3.5 - 5.1 mmol/L   Chloride 102 98 - 111 mmol/L   CO2 15 (L) 22 - 32 mmol/L   Glucose, Bld 86 70 - 99 mg/dL   BUN 8 6 - 20 mg/dL   Creatinine, Ser 0.67 0.44 - 1.00 mg/dL   Calcium 9.7 8.9 - 10.3 mg/dL   Total Protein 8.5 (H) 6.5 - 8.1 g/dL  Albumin 4.6 3.5 - 5.0 g/dL   AST 19 15 - 41 U/L   ALT 25 0 - 44 U/L   Alkaline Phosphatase 41 38 - 126 U/L   Total Bilirubin 0.9 0.3 - 1.2 mg/dL   GFR calc non Af Amer >60 >60 mL/min   GFR calc Af Amer >60 >60 mL/min   Anion gap 16 (H) 5 - 15   [place] Ob Less Than 14 Weeks With Ob Transvaginal  Result Date: 07/19/2019 CLINICAL DATA:  Abdominal pain for 5 days. First trimester pregnancy. EXAM: OBSTETRIC <14 WK US AND TRANSVAGINAL OB US TECHNIQUE: Both transabdominal and transvaginal ultrasound examinations were performed for complete evaluation of the gestation as well as the maternal uterus, adnexal regions, and pelvic cul-de-sac. Transvaginal technique was performed to assess early pregnancy. COMPARISON:  None. FINDINGS: Intrauterine gestational sac: Single Yolk sac:  Visualized. Embryo:  Visualized. Cardiac Activity: Visualized. Heart Rate: 114 bpm CRL:  3 mm   5 w   6 d                  US EDC: 03/14/2020 Subchorionic hemorrhage:  None visualized. Maternal uterus/adnexae: Normal appearance of both ovaries. No mass or abnormal free fluid identified. IMPRESSION: Single living IUP measuring 5 weeks 6 days, with US EDC of 03/14/2020. No significant maternal uterine or adnexal abnormality identified. Electronically Signed   By: Danae OrleansJohn A Stahl M.D.   On: 07/19/2019 14:04   MAU Course  Procedures  MDM N/V -worsening over past week, unable to eat/drink anything for 24hrs per pt report -pt has lost approx 3KG since 07/19/2019 -1L LR + 20mg  Pepcid + 10mg  Reglan given, pt reports N/V resolved, but unable to urinate -UA: hazy/1.035/80ketones/100PRO/few bacteria, sending urine for culture -UDS: +THC -CMP: K 3.1 (will give  KDur for home use d/t anticipated continuation of N/V), anion gap 16, will repeat CMP after fluids -2nd bag of LR ordered, 1L bolus -pt attempted to urinate after this time and reports success -PO challenge successful, pt reports N/V has resolved -provider requested to repeat CMP to check on elevated anion gap after rehydration, pt declines repeat CMP stating "I dont want to get stuck again" -pt does not wish to continue with Phenergan RX, requests discharge RX for Reglan as she reports it made her feel better than the Phenergan has.  SOB -pt declines EKG stating to NT "I just want my IV so I can go home" -lungs clear to auscultation -O2 @100 % -pt does not appear to have labored breathing -behavioral health consultation order placed -after treatment for N/V, pt reports SOB has resolved  -pt discharged to home in stable condition  Orders Placed This Encounter  Procedures  . Culture, OB Urine    Standing Status:   Standing    Number of Occurrences:   1  . Urinalysis, Routine w reflex microscopic    Standing Status:   Standing    Number of Occurrences:   1  . Comprehensive metabolic panel    Standing Status:   Standing    Number of Occurrences:   1  . Urine rapid drug screen (hosp performed)    Standing Status:   Standing    Number of Occurrences:   1  . Ambulatory referral to Integrated Behavioral Health    Referral Priority:   Routine    Referral Type:   Consultation    Referral Reason:   Specialty Services Required    Number of Visits Requested:   1  .  EKG 12-Lead    Standing Status:   Standing    Number of Occurrences:   1    Order Specific Question:   Reason for Exam    Answer:   shortness of breath  . Insert peripheral IV    Standing Status:   Standing    Number of Occurrences:   1  . Discharge patient    Order Specific Question:   Discharge disposition    Answer:   01-Home or Self Care [1]    Order Specific Question:   Discharge patient date    Answer:   08/09/2019    Meds ordered this encounter  Medications  . lactated ringers bolus 1,000 mL  . famotidine (PEPCID) IVPB 20 mg premix  . metoCLOPramide (REGLAN) injection 10 mg  . lactated ringers bolus 1,000 mL  . famotidine (PEPCID) 40 MG tablet    Sig: Take 1 tablet (40 mg total) by mouth every evening.    Dispense:  30 tablet    Refill:  1    Order Specific Question:   Supervising Provider    Answer:   Conan Bowens [[id number]]  . metoCLOPramide (REGLAN) 10 MG tablet    Sig: Take 1 tablet (10 mg total) by mouth 3 (three) times daily with meals.    Dispense:  90 tablet    Refill:  0    Order Specific Question:   Supervising Provider    Answer:   Conan Bowens [[id number]]  . potassium chloride (KLOR-CON) 10 MEQ tablet    Sig: Take 1 tablet (10 mEq total) by mouth daily.    Dispense:  5 tablet    Refill:  0    Order Specific Question:   Supervising Provider    Answer:   Conan Bowens [[id number]]   Assessment and Plan   1. Nausea and vomiting in pregnancy   2. Intrauterine pregnancy   3. Morning sickness   4. Other insomnia   5. Bacterial vaginosis   6. Shortness of breath during pregnancy   7. [redacted] weeks gestation of pregnancy   8. Hypokalemia due to excessive gastrointestinal loss of potassium   9. Anxiety   10. Marijuana user    Allergies as of 08/09/2019   No Known Allergies     Medication List    STOP taking these medications   promethazine 25 MG tablet Commonly known as: PHENERGAN   scopolamine 1 MG/3DAYS Commonly known as: TRANSDERM-SCOP     TAKE these medications   albuterol 108 (90 Base) MCG/ACT inhaler Commonly known as: VENTOLIN HFA Inhale 1-2 puffs into the lungs every 6 (six) hours as needed for wheezing or shortness of breath.   famotidine 40 MG tablet Commonly known as: PEPCID Take 1 tablet (40 mg total) by mouth every evening.   glycopyrrolate 2 MG tablet Commonly known as: ROBINUL Take 1 tablet (2 mg total) by mouth 3 (three) times daily as needed.    loratadine 10 MG tablet Commonly known as: CLARITIN Take 1 tablet (10 mg total) by mouth daily.   Magnesium 200 MG Tabs Take 2 tablets (400 mg total) by mouth at bedtime.   metoCLOPramide 10 MG tablet Commonly known as: REGLAN Take 1 tablet (10 mg total) by mouth 3 (three) times daily with meals.   montelukast 10 MG tablet Commonly known as: SINGULAIR Take 1 tablet (10 mg total) by mouth at bedtime.   potassium chloride 10 MEQ tablet Commonly known as: KLOR-CON Take 1 tablet (10 mEq total)  by mouth daily.   prenatal vitamin w/FE, FA 27-1 MG Tabs tablet Take 1 tablet by mouth daily at 12 noon.      -will call with culture results, if positive -work note given for 08/07/2019 and 08/09/2019, per pt request -list of safe meds in pregnancy given -list of OBs in [place] given, per patient request -pt advised on cessation of marijuana smoking, discussed relationship between negative fetal effects, negative maternal effects and N/V -RX Reglan, pt advised on side effects, focusing on extrapyramidal side effects, discussed when to discontinue Reglan, and instructed not to take concurrently with Phenergan. -RX Pepcid -pt advised to take medications around the clock, as prescribed, without skipping doses or stopping once feeling better -RX KDur -strict hyperemesis/N/V/pain/return MAU precautions given -pt discharged to home in stable condition  Joni Reining E Karia Ehresman 08/09/2019, 5:26 PM

## 2019-08-09 NOTE — Discharge Instructions (Signed)
Marijuana Use During Pregnancy and Breastfeeding    Marijuana is the dried leaves, flowers, and stems of the Cannabis sativa or Cannabis indica plant. The plant's active ingredients (cannabinoids), including a chemical called THC, change the chemistry of the brain. Marijuana smoke also has many of the same chemicals as cigarette smoke that cause breathing problems. Marijuana gets into your blood through your lungs when you smoke it and through your digestive system when you swallow it.  Using marijuana in any form may be harmful for you and your baby when you are trying to become pregnant and during pregnancy. This includes marijuana that is prescribed to you by a health care provider (medical marijuana). Once marijuana is in your blood, it can travel through your placenta to your baby. It may also pass through breast milk.  How does this affect me?  Marijuana affects you both mentally and physically. Using marijuana can make you feel high and relaxed. It can also have negative effects, especially at high doses or with long-term use. These include:   Rapid heartbeat and stress on your heart.   Lung irritation and breathing problems.   Difficulty thinking and making decisions.   Seeing or believing things that are not true (hallucinations and paranoia).   Mood swings, depression, or anxiety.   Decreased ability to learn and remember.   Difficulty getting pregnant.  Marijuana can also affect your pregnancy. Not all the effects are known. However, if you use marijuana during pregnancy, you may:   Be less likely to get regular prenatal care and do the things that you need to do to have a healthy pregnancy.   Be more likely to use other drugs that can harm your pregnancy, like drinking alcohol and smoking cigarettes.   Be at higher risk of having your baby die after 28 weeks of pregnancy (stillbirth).   Be at higher risk of giving birth before 37 weeks of pregnancy (premature birth).  How does this affect my  baby?  If you use marijuana during pregnancy, this may affect your baby's development, birth, and life after birth. Your baby may:   Be born prematurely, which can cause physical and mental problems.   Be born with a low birth weight, which can lead to physical and mental problems.   Have problems with brain development.   Have difficulty growing.   Have attention and behavior problems later in life.   Do poorly at school and have learning problems later in life.   Have problems with vision and coordination.   Be at higher risk for using marijuana by age 14.  More research is needed to find out exactly how marijuana affects a baby during breastfeeding. Some studies suggest that the chemicals in marijuana can be passed to a baby through breast milk. To limit possible risks, you should not use marijuana during breastfeeding.  Follow these instructions at home:   Let your health care provider know if you use marijuana before trying to get pregnant, during pregnancy, or during breastfeeding.   Do not use marijuana in any form when you are trying to get pregnant, when you are pregnant, or when you are breastfeeding. If you are having trouble stopping marijuana use, ask your health care provider for help.   Do not smoke. If you need help quitting, ask your health care provider for help.   If you are using medical marijuana, ask your health care provider to switch you to a medicine that is safer to use during   [email or web address] Contact a health care provider if:  You use marijuana and want to get pregnant.  You use marijuana during pregnancy or breastfeeding.  You need help stopping marijuana use. Get help right away if:  Your baby is not gaining weight or growing as  expected. Summary  Using marijuana in any form may be harmful for you and your baby when you are trying to become pregnant, during pregnancy, and during breastfeeding. This includes marijuana that is prescribed to you (medical marijuana).  Some studies suggest that marijuana may pass through breast milk and can affect your baby's brain development.  Talk to your health care provider if you use marijuana in any form while trying to get pregnant, during pregnancy, or while breastfeeding.  Ask your health care provider for help if you are not able to stop using marijuana. This information is not intended to replace advice given to you by your health care provider. Make sure you discuss any questions you have with your health care provider. Document Released: 07/09/2017 Document Revised: 02/12/2019 Document Reviewed: 07/09/2017 Elsevier Patient Education  2020 [employer] Cannabinoid Hyperemesis Syndrome Cannabinoid hyperemesis syndrome (CHS) is a condition that causes repeated nausea, vomiting, and abdominal pain after long-term (chronic) use of marijuana (cannabis). People with CHS typically use marijuana 3-5 times a day for many years before they have symptoms, although it is possible to develop CHS with as little as 1 use per day. Symptoms of CHS may be mild at first but can get worse and more frequent. In some cases, CHS may cause vomiting many times a day, which can lead to weight loss and dehydration. CHS may go away and come back many times (recur). People may not have symptoms or may otherwise be healthy in between [facility] attacks. What are the causes? The exact cause of this condition is not known. Long-term use of marijuana may over-stimulate certain proteins in the brain that react with chemicals in marijuana (cannabinoid receptors). This over-stimulation may cause CHS. What are the signs or symptoms? Symptoms of this condition are often mild during the first few attacks, but they can get  worse over time. Symptoms may include:  Frequent nausea, especially early in the morning.  Vomiting.  Abdominal pain. Taking several hot showers throughout the day can also be a sign of this condition. People with CHS may do this because it relieves symptoms. How is this diagnosed? This condition may be diagnosed based on:  Your symptoms and medical history, including any drug use.  A physical exam. You may have tests done to rule out other problems. These tests may include:  Blood tests.  Urine tests.  Imaging tests, such as an X-ray or CT scan. How is this treated? Treatment for this condition involves stopping marijuana use. Your health care provider may recommend:  A drug rehabilitation program, if you have trouble stopping marijuana use.  Medicines for nausea.  Hot showers to help relieve symptoms. Certain creams that contain a substance called capsaicin may improve symptoms when applied to the abdomen. Ask your health care provider before starting any medicines or other treatments. Severe nausea and vomiting may require you to stay at the hospital. You may need IV fluids to prevent or treat dehydration. You may also need certain medicines that must be given at the hospital. Follow these instructions at home: During an attack   Stay in bed and rest in a dark, quiet room.  Take anti-nausea medicine as told by your health care provider.  Try  taking hot showers to relieve your symptoms. After an attack  Drink small amounts of clear fluids slowly. Gradually add more.  Once you are able to eat without vomiting, eat soft foods in small amounts every 3-4 hours. General instructions   Do not use any products that contain marijuana.If you need help quitting, ask your health care provider for resources and treatment options.  Drink enough fluid to keep your urine pale yellow. Avoid drinking fluids that have a lot of sugar or caffeine, such as coffee and soda.  Take and  apply over-the-counter and prescription medicines only as told by your health care provider. Ask your health care provider before starting any new medicines or treatments.  Keep all follow-up visits as told by your health care provider. This is important. Contact a health care provider if:  Your symptoms get worse.  You cannot drink fluids without vomiting.  You have pain and trouble swallowing after an attack. Get help right away if:  You cannot stop vomiting.  You have blood in your vomit or your vomit looks like coffee grounds.  You have severe abdominal pain.  You have stools that are bloody or black, or stools that look like tar.  You have symptoms of dehydration, such as: ? Sunken eyes. ? Inability to make tears. ? Cracked lips. ? Dry mouth. ? Decreased urine production. ? Weakness. ? Sleepiness. ? Fainting. Summary  Cannabinoid hyperemesis syndrome (CHS) is a condition that causes repeated nausea, vomiting, and abdominal pain after long-term use of marijuana.  People with CHS typically use marijuana 3-5 times a day for many years before they have symptoms, although it is possible to develop CHS with as little as 1 use per day.  Treatment for this condition involves stopping marijuana use. Hot showers and capsaicin creams may also help relieve symptoms. Ask your health care provider before starting any medicines or other treatments.  Your health care provider may prescribe medicines to help with nausea.  Get help right away if you have signs of dehydration, such as dry mouth, decreased urine production, or weakness. This information is not intended to replace advice given to you by your health care provider. Make sure you discuss any questions you have with your health care provider. Document Released: 01/29/2017 Document Revised: 02/27/2018 Document Reviewed: 01/29/2017 Elsevier Patient Education  2020 [employer] Inc. Hyperemesis Gravidarum Hyperemesis gravidarum is  a severe form of nausea and vomiting that happens during pregnancy. Hyperemesis is worse than morning sickness. It may cause you to have nausea or vomiting all day for many days. It may keep you from eating and drinking enough food and liquids, which can lead to dehydration, malnutrition, and weight loss. Hyperemesis usually occurs during the first half (the first 20 weeks) of pregnancy. It often goes away once a woman is in her second half of pregnancy. However, sometimes hyperemesis continues through an entire pregnancy. What are the causes? The cause of this condition is not known. It may be related to changes in chemicals (hormones) in the body during pregnancy, such as the high level of pregnancy hormone (human chorionic gonadotropin) or the increase in the female sex hormone (estrogen). What are the signs or symptoms? Symptoms of this condition include:  Nausea that does not go away.  Vomiting that does not allow you to keep any food down.  Weight loss.  Body fluid loss (dehydration).  Having no desire to eat, or not liking food that you have previously enjoyed. How is this diagnosed? This  condition may be diagnosed based on:  A physical exam.  Your medical history.  Your symptoms.  Blood tests.  Urine tests. How is this treated? This condition is managed by controlling symptoms. This may include:  Following an eating plan. This can help lessen nausea and vomiting.  Taking prescription medicines. An eating plan and medicines are often used together to help control symptoms. If medicines do not help relieve nausea and vomiting, you may need to receive fluids through an IV at the hospital. Follow these instructions at home: Eating and drinking   Avoid the following: ? Drinking fluids with meals. Try not to drink anything during the 30 minutes before and after your meals. ? Drinking more than 1 cup of fluid at a time. ? Eating foods that trigger your symptoms. These may  include spicy foods, coffee, high-fat foods, very sweet foods, and acidic foods. ? Skipping meals. Nausea can be more intense on an empty stomach. If you cannot tolerate food, do not force it. Try sucking on ice chips or other frozen items and make up for missed calories later. ? Lying down within 2 hours after eating. ? Being exposed to environmental triggers. These may include food smells, smoky rooms, closed spaces, rooms with strong smells, warm or humid places, overly loud and noisy rooms, and rooms with motion or flickering lights. Try eating meals in a well-ventilated area that is free of strong smells. ? Quick and sudden changes in your movement. ? Taking iron pills and multivitamins that contain iron. If you take prescription iron pills, do not stop taking them unless your health care provider approves. ? Preparing food. The smell of food can spoil your appetite or trigger nausea.  To help relieve your symptoms: ? Listen to your body. Everyone is different and has different preferences. Find what works best for you. ? Eat and drink slowly. ? Eat 5-6 small meals daily instead of 3 large meals. Eating small meals and snacks can help you avoid an empty stomach. ? In the morning, before getting out of bed, eat a couple of crackers to avoid moving around on an empty stomach. ? Try eating starchy foods as these are usually tolerated well. Examples include cereal, toast, bread, potatoes, pasta, rice, and pretzels. ? Include at least 1 serving of protein with your meals and snacks. Protein options include lean meats, poultry, seafood, beans, nuts, nut butters, eggs, cheese, and yogurt. ? Try eating a protein-rich snack before bed. Examples of a protein-rick snack include cheese and crackers or a peanut butter sandwich made with 1 slice of whole-wheat bread and 1 tsp (5 g) of peanut butter. ? Eat or suck on things that have ginger in them. It may help relieve nausea. Add  tsp ground ginger to hot  tea or choose ginger tea. ? Try drinking 100% fruit juice or an electrolyte drink. An electrolyte drink contains sodium, potassium, and chloride. ? Drink fluids that are cold, clear, and carbonated or sour. Examples include lemonade, ginger ale, lemon-lime soda, ice water, and sparkling water. ? Brush your teeth or use a mouth rinse after meals. ? Talk with your health care provider about starting a supplement of vitamin B6. General instructions  Take over-the-counter and prescription medicines only as told by your health care provider.  Follow instructions from your health care provider about eating or drinking restrictions.  Continue to take your prenatal vitamins as told by your health care provider. If you are having trouble taking your prenatal vitamins,  talk with your health care provider about different options.  Keep all follow-up and pre-birth (prenatal) visits as told by your health care provider. This is important. Contact a health care provider if:  You have pain in your abdomen.  You have a severe headache.  You have vision problems.  You are losing weight.  You feel weak or dizzy. Get help right away if:  You cannot drink fluids without vomiting.  You vomit blood.  You have constant nausea and vomiting.  You are very weak.  You faint.  You have a fever and your symptoms suddenly get worse. Summary  Hyperemesis gravidarum is a severe form of nausea and vomiting that happens during pregnancy.  Making some changes to your eating habits may help relieve nausea and vomiting.  This condition may be managed with medicine.  If medicines do not help relieve nausea and vomiting, you may need to receive fluids through an IV at the hospital. This information is not intended to replace advice given to you by your health care provider. Make sure you discuss any questions you have with your health care provider. Document Released: 10/21/2005 Document Revised: 11/10/2017  Document Reviewed: 06/19/2016 Elsevier Patient Education  2020 [employer] Inc. Morning Sickness  Morning sickness is when a woman feels nauseous during pregnancy. This nauseous feeling may or may not come with vomiting. It often occurs in the morning, but it can be a problem at any time of day. Morning sickness is most common during the first trimester. In some cases, it may continue throughout pregnancy. Although morning sickness is unpleasant, it is usually harmless unless the woman develops severe and continual vomiting (hyperemesis gravidarum), a condition that requires more intense treatment. What are the causes? The exact cause of this condition is not known, but it seems to be related to normal hormonal changes that occur in pregnancy. What increases the risk? You are more likely to develop this condition if:  You experienced nausea or vomiting before your pregnancy.  You had morning sickness during a previous pregnancy.  You are pregnant with more than one baby, such as twins. What are the signs or symptoms? Symptoms of this condition include:  Nausea.  Vomiting. How is this diagnosed? This condition is usually diagnosed based on your signs and symptoms. How is this treated? In many cases, treatment is not needed for this condition. Making some changes to what you eat may help to control symptoms. Your health care provider may also prescribe or recommend:  Vitamin B6 supplements.  Anti-nausea medicines.  Ginger. Follow these instructions at home: Medicines  Take over-the-counter and prescription medicines only as told by your health care provider. Do not use any prescription, over-the-counter, or herbal medicines for morning sickness without first talking with your health care provider.  Taking multivitamins before getting pregnant can prevent or decrease the severity of morning sickness in most women. Eating and drinking  Eat a piece of dry toast or crackers before  getting out of bed in the morning.  Eat 5 or 6 small meals a day.  Eat dry and bland foods, such as rice or a baked potato. Foods that are high in carbohydrates are often helpful.  Avoid greasy, fatty, and spicy foods.  Have someone cook for you if the smell of any food causes nausea and vomiting.  If you feel nauseous after taking prenatal vitamins, take the vitamins at night or with a snack.  Snack on protein foods between meals if you are hungry. Nuts,  yogurt, and cheese are good options.  Drink fluids throughout the day.  Try ginger ale made with real ginger, ginger tea made from fresh grated ginger, or ginger candies. General instructions  Do not use any products that contain nicotine or tobacco, such as cigarettes and e-cigarettes. If you need help quitting, ask your health care provider.  Get an air purifier to keep the air in your house free of odors.  Get plenty of fresh air.  Try to avoid odors that trigger your nausea.  Consider trying these methods to help relieve symptoms: ? Wearing an acupressure wristband. These wristbands are often worn for seasickness. ? Acupuncture. Contact a health care provider if:  Your home remedies are not working and you need medicine.  You feel dizzy or light-headed.  You are losing weight. Get help right away if:  You have persistent and uncontrolled nausea and vomiting.  You faint.  You have severe pain in your abdomen. Summary  Morning sickness is when a woman feels nauseous during pregnancy. This nauseous feeling may or may not come with vomiting.  Morning sickness is most common during the first trimester.  It often occurs in the morning, but it can be a problem at any time of day.  In many cases, treatment is not needed for this condition. Making some changes to what you eat may help to control symptoms. This information is not intended to replace advice given to you by your health care provider. Make sure you  discuss any questions you have with your health care provider. Document Released: 12/12/2006 Document Revised: 10/03/2017 Document Reviewed: 11/23/2016 Elsevier Patient Education  2020 [facility]     Phone: [phone]  [facility] [place] [facility] [place]  Phone: [phone]  [facility] [place] [facility] [place]  Phone: [place] [facility] [place] [facility] [place]                           Phone: [place] [facility] [place] [facility]          Phone: [phone]  [facility]    Phone: [place])    Phone: [facility]    Phone: [phone]  [facility]    Phone: [place]    Phone: [phone]  [facility]  Phone: [place]               Phone: [phone]  [facility]   Phone: [phone]  [facility]    Phone: [phone]                                  Safe Medications in Pregnancy    Acne: Benzoyl Peroxide Salicylic Acid  Backache/Headache: Tylenol: 2 regular strength every 4 hours OR              2 Extra strength every 6 hours  Colds/Coughs/Allergies: Benadryl (alcohol free) 25 mg every 6 hours as needed Breath right strips Claritin Cepacol throat lozenges Chloraseptic throat spray Cold-Eeze- up to three times per day Cough drops, alcohol free Flonase (by prescription only) Guaifenesin Mucinex Robitussin DM (plain only, alcohol free) Saline nasal spray/drops Sudafed (pseudoephedrine) &  Actifed ** use only after [redacted] weeks gestation and if you do not have high blood pressure Tylenol Vicks Vaporub Zinc lozenges Zyrtec   Constipation: Colace Ducolax  suppositories Fleet enema Glycerin suppositories Metamucil Milk of magnesia Miralax Senokot Smooth move tea  Diarrhea: Kaopectate Imodium A-D  *NO pepto Bismol  Hemorrhoids: Anusol Anusol HC Preparation H Tucks  Indigestion: Tums Maalox Mylanta Zantac  Pepcid  Insomnia: Benadryl (alcohol free)  every 6 hours as needed Tylenol PM Unisom, no Gelcaps  Leg Cramps: Tums MagGel  Nausea/Vomiting:  Bonine Dramamine Emetrol Ginger extract Sea bands Meclizine  Nausea medication to take during pregnancy:  Unisom (doxylamine succinate 25 mg tablets) Take one tablet daily at bedtime. If symptoms are not adequately controlled, the dose can be increased to a maximum recommended dose of two tablets daily (1/2 tablet in the morning, 1/2 tablet mid-afternoon and one at bedtime). Vitamin B6  tablets. Take one tablet twice a day (up to 200 mg per day).  Skin Rashes: Aveeno products Benadryl cream or  every 6 hours as needed Calamine Lotion 1% cortisone cream  Yeast infection: Gyne-lotrimin 7 Monistat 7   **If taking multiple medications, please check labels to avoid duplicating the same active ingredients **take medication as directed on the label ** Do not exceed 4000 mg of tylenol in 24 hours **Do not take medications that contain aspirin or ibuprofen

## 2019-08-09 NOTE — MAU Note (Signed)
Pt. Refused EKG stated she just want her IV fluids so she can go home. Provided and RN notified of pt.

## 2019-08-10 DIAGNOSIS — J309 Allergic rhinitis, unspecified: Secondary | ICD-10-CM | POA: Insufficient documentation

## 2019-08-10 DIAGNOSIS — O210 Mild hyperemesis gravidarum: Secondary | ICD-10-CM | POA: Insufficient documentation

## 2019-08-10 HISTORY — DX: Mild hyperemesis gravidarum: O21.0

## 2019-08-10 LAB — CULTURE, OB URINE: Culture: <10000 — AB

## 2019-08-19 ENCOUNTER — Encounter: Payer: BC Managed Care – PPO | Admitting: Obstetrics & Gynecology

## 2019-09-21 ENCOUNTER — Other Ambulatory Visit: Payer: Self-pay | Admitting: Obstetrics and Gynecology

## 2019-12-15 ENCOUNTER — Other Ambulatory Visit: Payer: Self-pay

## 2019-12-15 ENCOUNTER — Encounter (HOSPITAL_COMMUNITY): Payer: Self-pay | Admitting: Emergency Medicine

## 2019-12-15 ENCOUNTER — Emergency Department (HOSPITAL_COMMUNITY)
Admission: EM | Admit: 2019-12-15 | Discharge: 2019-12-15 | Disposition: A | Discharge status: Home / Self Care | Payer: BC Managed Care – PPO | Attending: Emergency Medicine | Admitting: Emergency Medicine

## 2019-12-15 DIAGNOSIS — R05 Cough: Secondary | ICD-10-CM | POA: Insufficient documentation

## 2019-12-15 DIAGNOSIS — Z5321 Procedure and treatment not carried out due to patient leaving prior to being seen by health care provider: Secondary | ICD-10-CM | POA: Insufficient documentation

## 2019-12-15 DIAGNOSIS — R062 Wheezing: Secondary | ICD-10-CM | POA: Insufficient documentation

## 2019-12-15 MED ORDER — ALBUTEROL SULFATE HFA 108 (90 BASE) MCG/ACT IN AERS
2.0000 | INHALATION_SPRAY | Freq: Once | RESPIRATORY_TRACT | Status: AC
  Administered 2019-12-15: 2 via RESPIRATORY_TRACT
  Filled 2019-12-15: qty 6.7

## 2019-12-15 NOTE — ED Notes (Signed)
Unable to locate patient at waiting area multiple times.  

## 2019-12-15 NOTE — ED Triage Notes (Signed)
Patient stated that she ran out of her asthma inhaler this week , reports wheezing and occasional dry cough . Denies fever or chills .

## 2019-12-15 NOTE — ED Notes (Signed)
Pt no longer inside waiting area

## 2019-12-17 ENCOUNTER — Other Ambulatory Visit: Payer: Self-pay

## 2019-12-17 ENCOUNTER — Encounter (HOSPITAL_COMMUNITY): Payer: Self-pay

## 2019-12-17 ENCOUNTER — Ambulatory Visit (HOSPITAL_COMMUNITY)
Admission: EM | Admit: 2019-12-17 | Discharge: 2019-12-17 | Disposition: A | Discharge status: Home / Self Care | Payer: Self-pay | Attending: Emergency Medicine | Admitting: Emergency Medicine

## 2019-12-17 DIAGNOSIS — N898 Other specified noninflammatory disorders of vagina: Secondary | ICD-10-CM | POA: Insufficient documentation

## 2019-12-17 DIAGNOSIS — Z3202 Encounter for pregnancy test, result negative: Secondary | ICD-10-CM

## 2019-12-17 DIAGNOSIS — J4520 Mild intermittent asthma, uncomplicated: Secondary | ICD-10-CM | POA: Insufficient documentation

## 2019-12-17 LAB — POCT PREGNANCY, URINE: Preg Test, Ur: NEGATIVE

## 2019-12-17 LAB — POCT URINALYSIS DIP (DEVICE)
Bilirubin Urine: NEGATIVE
Glucose, UA: NEGATIVE mg/dL
Hgb urine dipstick: NEGATIVE
Ketones, ur: NEGATIVE mg/dL
Leukocytes,Ua: NEGATIVE
Nitrite: NEGATIVE
Protein, ur: NEGATIVE mg/dL
Specific Gravity, Urine: >=1.03 (ref 1.005–1.030)
Urobilinogen, UA: 0.2 mg/dL (ref 0.0–1.0)
pH: 6 (ref 5.0–8.0)

## 2019-12-17 LAB — POC URINE PREG, ED
Preg Test, Ur: NEGATIVE
Preg Test, Ur: NEGATIVE

## 2019-12-17 MED ORDER — METRONIDAZOLE 500 MG PO TABS: 500.0000 mg | ORAL_TABLET | Freq: Two times a day (BID) | ORAL | 0 refills | Status: AC

## 2019-12-17 MED ORDER — ALBUTEROL SULFATE HFA 108 (90 BASE) MCG/ACT IN AERS: 1.0000 | INHALATION_SPRAY | Freq: Four times a day (QID) | RESPIRATORY_TRACT | 0 refills | Status: DC | PRN

## 2019-12-17 NOTE — Discharge Instructions (Addendum)
We will start flagyl for bv at this time pending the results of your vaginal swab.  Will notify of any positive findings and if any changes to treatment are needed.   Your urine is negative for infection, it does appear that you can increase your water intake which may be helpful.  I have sent a refill of your inhaler.  Please establish with a primary care provider for long term management of your asthma.

## 2019-12-17 NOTE — ED Triage Notes (Signed)
Pt presents with urinary urgency and vaginal odor X 2 days.

## 2019-12-17 NOTE — ED Provider Notes (Signed)
[facility]    CSN: [id number] Arrival date & time: 12/17/19  2633      History   Chief Complaint Chief Complaint  Patient presents with  . Urinary Tract Infection    HPI Eileen Graham is a 24 y.o. female.   Eileen Graham presents with complaints of some odor to urine as well as vaginal discharge. No vaginal itching. Sometimes feels like she needs to continue urination. No other frequency, no pain with urination. Noted symptoms yesterday. No pelvic or back pain. Has had BV in the past and felt somewhat similar. She is sexually active with 2 partners and doesn't use condoms. No known exposure to stds. Also requesting refill of her asthma inhaler for prn use. No acute shortness of breath currently. Occasionally wheezes.    ROS per HPI, negative if not otherwise mentioned.      Past Medical History:  Diagnosis Date  . Alopecia   . Asthma   . Shingles     Patient Active Problem List   Diagnosis Date Noted  . Intrauterine pregnancy 07/19/2019  . Morning sickness 07/19/2019  . Insomnia 07/19/2019  . Bacterial vaginosis 07/19/2019    Past Surgical History:  Procedure Laterality Date  . NO PAST SURGERIES      OB History    Gravida  3   Para  1   Term  1   Preterm      AB  1   Living  1     SAB      TAB  1   Ectopic      Multiple      Live Births  1            Home Medications    Prior to Admission medications   Medication Sig Start Date End Date Taking? Authorizing Provider  albuterol (PROAIR HFA) 108 (90 Base) MCG/ACT inhaler Inhale 1-2 puffs into the lungs every 6 (six) hours as needed for wheezing or shortness of breath. 12/17/19   Georgetta Haber, NP  famotidine (PEPCID) 40 MG tablet Take 1 tablet (40 mg total) by mouth every evening. 08/09/19 08/08/20  Nugent, Odie Sera, NP  glycopyrrolate (ROBINUL) 2 MG tablet Take 1 tablet (2 mg total) by mouth 3 (three) times daily as needed. 08/07/19   Leftwich-Kirby, Wilmer Floor, CNM    loratadine (CLARITIN) 10 MG tablet Take 1 tablet (10 mg total) by mouth daily. 02/19/19   Wieters, Hallie C, PA-C  Magnesium 200 MG TABS Take 2 tablets (400 mg total) by mouth at bedtime. 07/19/19   Eileen Graham, CNM  metoCLOPramide (REGLAN) 10 MG tablet Take 1 tablet (10 mg total) by mouth 3 (three) times daily with meals. 08/09/19 09/08/19  Nugent, Odie Sera, NP  metroNIDAZOLE (FLAGYL) 500 MG tablet Take 1 tablet (500 mg total) by mouth 2 (two) times daily for 7 days. 12/17/19 12/24/19  Georgetta Haber, NP  montelukast (SINGULAIR) 10 MG tablet Take 1 tablet (10 mg total) by mouth at bedtime. Patient not taking: Reported on 07/14/2019 02/19/19   Wieters, Hallie C, PA-C  potassium chloride (KLOR-CON) 10 MEQ tablet Take 1 tablet (10 mEq total) by mouth daily. 08/09/19   Nugent, Odie Sera, NP  prenatal vitamin w/FE, FA (PRENATAL 1 + 1) 27-1 MG TABS tablet Take 1 tablet by mouth daily at 12 noon.    [provider]    Family History Family History  Problem Relation Age of Onset  . Asthma Maternal Grandfather   .  Congestive Heart Failure Maternal Grandfather     Social History Social History   Tobacco Use  . Smoking status: Former Smoker    Types: Cigarettes  . Smokeless tobacco: Never Used  Substance Use Topics  . Alcohol use: Not Currently  . Drug use: Yes    Types: Marijuana    Comment: August 2020     Allergies   Patient has no known allergies.   Review of Systems Review of Systems   Physical Exam Triage Vital Signs ED Triage Vitals  Enc Vitals Group     BP 12/17/19 0853 108/70     Pulse Rate 12/17/19 0853 80     Resp 12/17/19 0853 17     Temp 12/17/19 0853 97.7 F (36.5 C)     Temp Source 12/17/19 0853 Oral     SpO2 12/17/19 0853 96 %     Weight --      Height --      Head Circumference --      Peak Flow --      Pain Score 12/17/19 0851 3     Pain Loc --      Pain Edu? --      Excl. in GC? --    No data found.  Updated Vital Signs BP 108/70 (BP  Location: Left Arm)   Pulse 80   Temp 97.7 F (36.5 C) (Oral)   Resp 17   LMP 11/24/2019 (Approximate)   SpO2 96%    Physical Exam Constitutional:      General: She is not in acute distress.    Appearance: She is well-developed.  Cardiovascular:     Rate and Rhythm: Normal rate.  Pulmonary:     Effort: Pulmonary effort is normal.     Breath sounds: Normal breath sounds.  Abdominal:     Palpations: Abdomen is not rigid.     Tenderness: There is no abdominal tenderness. There is no guarding or rebound.  Genitourinary:    Comments: Denies sores, lesions, vaginal bleeding; no pelvic pain; gu exam deferred at this time, vaginal self swab collected.   Skin:    General: Skin is warm and dry.  Neurological:     Mental Status: She is alert and oriented to person, place, and time.      UC Treatments / Results  Labs (all labs ordered are listed, but only abnormal results are displayed) Labs Reviewed  POC URINE PREG, ED  POC URINE PREG, ED  POCT URINALYSIS DIP (DEVICE)  POCT PREGNANCY, URINE  CERVICOVAGINAL ANCILLARY ONLY    EKG   Radiology No results found.  Procedures Procedures (including critical care time)  Medications Ordered in UC Medications - No data to display  Initial Impression / Assessment and Plan / UC Course  I have reviewed the triage vital signs and the nursing notes.  Pertinent labs & imaging results that were available during my care of the patient were reviewed by me and considered in my medical decision making (see chart for details).     Urine is without acute findings today, elevated specific gravity noted. Encouraged increased water intake. Flagyl initiated pending vaginal cytology. Inhaler refilled. Return precautions provided. Patient verbalized understanding and agreeable to plan.   Final Clinical Impressions(s) / UC Diagnoses   Final diagnoses:  Vaginal odor  Mild intermittent asthma without complication     Discharge Instructions      We will start flagyl for bv at this time pending the results of your vaginal swab.  Will notify of any positive findings and if any changes to treatment are needed.   Your urine is negative for infection, it does appear that you can increase your water intake which may be helpful.  I have sent a refill of your inhaler.  Please establish with a primary care provider for long term management of your asthma.     ED Prescriptions    Medication Sig Dispense Auth. Provider   metroNIDAZOLE (FLAGYL) 500 MG tablet Take 1 tablet (500 mg total) by mouth 2 (two) times daily for 7 days. 14 tablet Augusto Gamble B, NP   albuterol (PROAIR HFA) 108 (90 Base) MCG/ACT inhaler Inhale 1-2 puffs into the lungs every 6 (six) hours as needed for wheezing or shortness of breath. 8 g Zigmund Gottron, NP     PDMP not reviewed this encounter.   Zigmund Gottron, NP 12/17/19 1744

## 2019-12-23 LAB — CERVICOVAGINAL ANCILLARY ONLY
Bacterial vaginitis: POSITIVE — AB
Candida vaginitis: NEGATIVE
Chlamydia: NEGATIVE
Neisseria Gonorrhea: NEGATIVE
Trichomonas: NEGATIVE

## 2020-05-12 ENCOUNTER — Other Ambulatory Visit: Payer: Self-pay

## 2020-05-12 ENCOUNTER — Emergency Department (HOSPITAL_COMMUNITY)
Admission: EM | Admit: 2020-05-12 | Discharge: 2020-05-12 | Disposition: A | Discharge status: Home / Self Care | Payer: Self-pay | Attending: Emergency Medicine | Admitting: Emergency Medicine

## 2020-05-12 DIAGNOSIS — R439 Unspecified disturbances of smell and taste: Secondary | ICD-10-CM | POA: Insufficient documentation

## 2020-05-12 DIAGNOSIS — R05 Cough: Secondary | ICD-10-CM | POA: Insufficient documentation

## 2020-05-12 DIAGNOSIS — Z5321 Procedure and treatment not carried out due to patient leaving prior to being seen by health care provider: Secondary | ICD-10-CM | POA: Insufficient documentation

## 2020-05-12 MED ORDER — ALBUTEROL SULFATE HFA 108 (90 BASE) MCG/ACT IN AERS
INHALATION_SPRAY | RESPIRATORY_TRACT | Status: AC
  Filled 2020-05-12: qty 6.7

## 2020-05-12 MED ORDER — ALBUTEROL SULFATE HFA 108 (90 BASE) MCG/ACT IN AERS
2.0000 | INHALATION_SPRAY | Freq: Once | RESPIRATORY_TRACT | Status: AC
  Administered 2020-05-12: 2 via RESPIRATORY_TRACT

## 2020-05-12 NOTE — ED Triage Notes (Signed)
Pt reports being on a trip with multiple people dx with covid and now has lost her sense of taste with cough and body aches. Hx asthma. Has run out of her inhaler at home.

## 2020-06-12 ENCOUNTER — Encounter (HOSPITAL_COMMUNITY): Payer: Self-pay | Admitting: Emergency Medicine

## 2020-06-12 ENCOUNTER — Other Ambulatory Visit: Payer: Self-pay

## 2020-06-12 ENCOUNTER — Ambulatory Visit (INDEPENDENT_AMBULATORY_CARE_PROVIDER_SITE_OTHER): Payer: Self-pay

## 2020-06-12 ENCOUNTER — Ambulatory Visit (HOSPITAL_COMMUNITY)
Admission: EM | Admit: 2020-06-12 | Discharge: 2020-06-12 | Disposition: A | Discharge status: Home / Self Care | Payer: Self-pay | Attending: Family Medicine | Admitting: Family Medicine

## 2020-06-12 ENCOUNTER — Telehealth (HOSPITAL_COMMUNITY): Payer: Self-pay | Admitting: Emergency Medicine

## 2020-06-12 DIAGNOSIS — M546 Pain in thoracic spine: Secondary | ICD-10-CM

## 2020-06-12 DIAGNOSIS — R0602 Shortness of breath: Secondary | ICD-10-CM

## 2020-06-12 DIAGNOSIS — R079 Chest pain, unspecified: Secondary | ICD-10-CM

## 2020-06-12 DIAGNOSIS — R071 Chest pain on breathing: Secondary | ICD-10-CM

## 2020-06-12 MED ORDER — IBUPROFEN 600 MG PO TABS
600.0000 mg | ORAL_TABLET | Freq: Four times a day (QID) | ORAL | 0 refills | Status: DC | PRN
Start: 2020-06-12 — End: 2021-02-06

## 2020-06-12 MED ORDER — KETOROLAC TROMETHAMINE 30 MG/ML IJ SOLN
INTRAMUSCULAR | Status: AC
  Filled 2020-06-12: qty 1

## 2020-06-12 MED ORDER — KETOROLAC TROMETHAMINE 30 MG/ML IJ SOLN
30.0000 mg | Freq: Once | INTRAMUSCULAR | Status: AC
  Administered 2020-06-12: 30 mg via INTRAMUSCULAR

## 2020-06-12 MED ORDER — TIZANIDINE HCL 4 MG PO TABS: 4.0000 mg | ORAL_TABLET | Freq: Four times a day (QID) | ORAL | 0 refills | Status: DC | PRN

## 2020-06-12 MED ORDER — ALBUTEROL SULFATE HFA 108 (90 BASE) MCG/ACT IN AERS: 1.0000 | INHALATION_SPRAY | Freq: Four times a day (QID) | RESPIRATORY_TRACT | 0 refills | Status: DC | PRN

## 2020-06-12 MED ORDER — HYDROCODONE-ACETAMINOPHEN 5-325 MG PO TABS: 1.0000 | ORAL_TABLET | Freq: Four times a day (QID) | ORAL | 0 refills | Status: DC | PRN

## 2020-06-12 NOTE — ED Notes (Signed)
Called pt, no answer.

## 2020-06-12 NOTE — Telephone Encounter (Signed)
Patient needs a refill of her albuterol.  This will be sent

## 2020-06-12 NOTE — Discharge Instructions (Signed)
Take ibuprofen 3 times a day for 5 This is for moderate pain Take the Vicodin if needed for severe pain (hydrocodone).  Do not drive on hydrocodone take tizanidine if needed as muscle relaxer.  This is useful at bedtime Return if not improving in a couple days.  Go to ER if you become worse

## 2020-06-12 NOTE — ED Provider Notes (Signed)
[facility]    CSN: [id number] Arrival date & time: 06/12/20  1046      History   Chief Complaint Chief Complaint  Patient presents with  . Back Pain  . Chest Pain    HPI Eileen Graham is a 24 y.o. female.   HPI   Patient is here for chest pain and back pain.  She states her back is been hurting since yesterday.  It hurts with certain movements.  It hurts if she tries to lay flat on her back.  The pain is in her mid back.  She has not taken any medicine for pain.  Today she noticed that she had pain in her anterior chest.  Hurts with deep breath.  The pain goes from her chest through to her back.  No cough.  No shortness of breath.  No hemoptysis.  Patient is not pregnant.  No immobilization.  No calf pain or swelling.  No history of DVT.  No history of estrogens.  No history of smoking. Patient has no hypertension.  No diabetes.  No history of heart disease.  No history of hyperlipidemia. She states her grandmother had open heart surgery, but it was not until she was older. She has no history of asthma or lung disease  Past Medical History:  Diagnosis Date  . Alopecia   . Asthma   . Shingles     Patient Active Problem List   Diagnosis Date Noted  . Intrauterine pregnancy 07/19/2019  . Morning sickness 07/19/2019  . Insomnia 07/19/2019  . Bacterial vaginosis 07/19/2019    Past Surgical History:  Procedure Laterality Date  . NO PAST SURGERIES      OB History    Gravida  3   Para  1   Term  1   Preterm      AB  1   Living  1     SAB      TAB  1   Ectopic      Multiple      Live Births  1            Home Medications    Prior to Admission medications   Medication Sig Start Date End Date Taking? Authorizing Provider  albuterol (PROAIR HFA) 108 (90 Base) MCG/ACT inhaler Inhale 1-2 puffs into the lungs every 6 (six) hours as needed for wheezing or shortness of breath. 12/17/19  Yes Burky, Dorene Grebe B, NP  HYDROcodone-acetaminophen  (NORCO/VICODIN) 5-325 MG tablet Take 1-2 tablets by mouth every 6 (six) hours as needed. 06/12/20   Eustace Moore, MD  ibuprofen (ADVIL) 600 MG tablet Take 1 tablet (600 mg total) by mouth every 6 (six) hours as needed. 06/12/20   Eustace Moore, MD  tiZANidine (ZANAFLEX) 4 MG tablet Take 1-2 tablets (4-8 mg total) by mouth every 6 (six) hours as needed for muscle spasms. 06/12/20   Eustace Moore, MD  famotidine (PEPCID) 40 MG tablet Take 1 tablet (40 mg total) by mouth every evening. 08/09/19 06/12/20  Nugent, Odie Sera, NP  glycopyrrolate (ROBINUL) 2 MG tablet Take 1 tablet (2 mg total) by mouth 3 (three) times daily as needed. 08/07/19 06/12/20  Leftwich-Kirby, Wilmer Floor, CNM  loratadine (CLARITIN) 10 MG tablet Take 1 tablet (10 mg total) by mouth daily. 02/19/19 06/12/20  Wieters, Hallie C, PA-C  metoCLOPramide (REGLAN) 10 MG tablet Take 1 tablet (10 mg total) by mouth 3 (three) times daily with meals. 08/09/19 06/12/20  Nugent, Joni Reining  E, NP  montelukast (SINGULAIR) 10 MG tablet Take 1 tablet (10 mg total) by mouth at bedtime. Patient not taking: Reported on 07/14/2019 02/19/19 06/12/20  Wieters, Hallie C, PA-C  potassium chloride (KLOR-CON) 10 MEQ tablet Take 1 tablet (10 mEq total) by mouth daily. 08/09/19 06/12/20  Nugent, Odie Sera, NP    Family History Family History  Problem Relation Age of Onset  . Asthma Maternal Grandfather   . Congestive Heart Failure Maternal Grandfather     Social History Social History   Tobacco Use  . Smoking status: Former Smoker    Types: Cigarettes  . Smokeless tobacco: Never Used  Vaping Use  . Vaping Use: Never used  Substance Use Topics  . Alcohol use: Yes  . Drug use: Not Currently    Types: Marijuana    Comment: August 2020     Allergies   Patient has no known allergies.   Review of Systems Review of Systems See HPI   Physical Exam Triage Vital Signs ED Triage Vitals  Enc Vitals Group     BP 06/12/20 1238 131/76     Pulse Rate 06/12/20 1238  91     Resp 06/12/20 1238 18     Temp 06/12/20 1238 98.7 F (37.1 C)     Temp Source 06/12/20 1238 Oral     SpO2 06/12/20 1238 100 %     Weight --      Height --      Head Circumference --      Peak Flow --      Pain Score 06/12/20 1233 10     Pain Loc --      Pain Edu? --      Excl. in GC? --    No data found.  Updated Vital Signs BP 131/76 (BP Location: Right Arm)   Pulse 91   Temp 98.7 F (37.1 C) (Oral)   Resp 18   LMP 05/19/2020 (Exact Date)   SpO2 100%     Physical Exam Constitutional:      General: She is not in acute distress.    Appearance: She is well-developed.  HENT:     Head: Normocephalic and atraumatic.     Right Ear: Tympanic membrane, ear canal and external ear normal.     Left Ear: Tympanic membrane, ear canal and external ear normal.     Nose: Nose normal.     Mouth/Throat:     Mouth: Mucous membranes are moist.     Pharynx: No posterior oropharyngeal erythema.  Eyes:     Conjunctiva/sclera: Conjunctivae normal.     Pupils: Pupils are equal, round, and reactive to light.  Cardiovascular:     Rate and Rhythm: Normal rate and regular rhythm.     Heart sounds: Normal heart sounds.  Pulmonary:     Effort: Pulmonary effort is normal. No respiratory distress.     Breath sounds: Normal breath sounds.  Chest:     Chest wall: No tenderness.  Abdominal:     General: Abdomen is flat. There is no distension.     Palpations: Abdomen is soft.  Musculoskeletal:        General: Normal range of motion.     Cervical back: Normal range of motion and neck supple.     Right lower leg: No edema.     Left lower leg: No edema.     Comments: No calf tenderness  Skin:    General: Skin is warm and dry.  Neurological:  General: No focal deficit present.     Mental Status: She is alert.  Psychiatric:        Mood and Affect: Mood normal.        Behavior: Behavior normal.      UC Treatments / Results  Labs (all labs ordered are listed, but only abnormal  results are displayed) Labs Reviewed - No data to display  EKG   Radiology DG Chest 2 View  Result Date: 06/12/2020 CLINICAL DATA:  Chest pain and shortness of breath today. EXAM: CHEST - 2 VIEW COMPARISON:  None. FINDINGS: Lungs clear. Heart size normal. No pneumothorax or pleural fluid. No acute or focal bony abnormality. IMPRESSION: Negative chest. Electronically Signed   By: Drusilla Kanner M.D.   On: 06/12/2020 14:27    Procedures Procedures (including critical care time)  Medications Ordered in UC Medications  ketorolac (TORADOL) 30 MG/ML injection 30 mg (30 mg Intramuscular Given 06/12/20 1456)    Initial Impression / Assessment and Plan / UC Course  I have reviewed the triage vital signs and the nursing notes.  Pertinent labs & imaging results that were available during my care of the patient were reviewed by me and considered in my medical decision making (see chart for details).     Reviewed with patient differential diagnosis.  The only diet gnosis that I cannot rule out here in the urgent care center is a PE.  She does not have any historical or physical exam findings that would be suspicious for PE.  Her Wells score is 3.  Only because I do not have a diagnosis.  We will treat symptomatically.  Is advised to the ER promptly for any worsening pain, shortness of breath Final Clinical Impressions(s) / UC Diagnoses   Final diagnoses:  Midline thoracic back pain, unspecified chronicity  Chest pain made worse by breathing     Discharge Instructions     Take ibuprofen 3 times a day for 5 This is for moderate pain Take the Vicodin if needed for severe pain (hydrocodone).  Do not drive on hydrocodone take tizanidine if needed as muscle relaxer.  This is useful at bedtime Return if not improving in a couple days.  Go to ER if you become worse    ED Prescriptions    Medication Sig Dispense Auth. Provider   ibuprofen (ADVIL) 600 MG tablet Take 1 tablet (600 mg total) by  mouth every 6 (six) hours as needed. 30 tablet Eustace Moore, MD   HYDROcodone-acetaminophen (NORCO/VICODIN) 5-325 MG tablet Take 1-2 tablets by mouth every 6 (six) hours as needed. 10 tablet Eustace Moore, MD   tiZANidine (ZANAFLEX) 4 MG tablet Take 1-2 tablets (4-8 mg total) by mouth every 6 (six) hours as needed for muscle spasms. 21 tablet Eustace Moore, MD     I have reviewed the PDMP during this encounter.   Eustace Moore, MD 06/12/20 [phone]

## 2020-06-12 NOTE — ED Triage Notes (Addendum)
Patient had upper back pain yesterday.  Today, patient has noticed chest pain Chest hurts with coughing, deep breathing, eating or drinking.  Patient denies wheezing.  Pain is sharp and points to center chest.    Patient says she had a positive covid test on July 11 at [facility]

## 2020-11-04 NOTE — L&D Delivery Note (Addendum)
[facility]N Faculty [facility] Delivery Note  Eileen Graham is a 25 y.o. [phone] s/p SVD at [redacted]w[redacted]d. She was admitted for IOL due to mild polyhydramnios.   ROM: 6h 79m with clear fluid GBS Status: negative Maximum Maternal Temperature: 98.7 F  Delivery Date/Time: 09/19/2021 at 1201 Delivery: Called to room and patient was complete and pushing. Head delivered. No nuchal cord present. Anterior shoulder slow to deliver, posterior shoulder and body delivered in usual fashion. Infant with spontaneous cry, placed on mother's abdomen, dried and stimulated. Cord clamped x 2 after 1-minute delay, and cut by grandmother. Cord blood drawn. Placenta delivered spontaneously with gentle cord traction. Fundus firm with massage and Pitocin.   At this time, the post-placental Lilleta IUD was placed manually by Dr. Ephriam Jenkins, and the strings were cut at the introitus. Labia, perineum, vagina, and cervix inspected inspected with periurethral lacerations bilaterally.   Placenta: Intact, 3VC, sent to L&D Complications: None Lacerations: B/l periurethral, repaired with 4-0 monocryl and hemostasis acheived EBL: 200cc Analgesia: Epidural  Infant: Viable  APGARs 7 and 9  [name], DO Family Medicine Resident PGY-1 [facility] 09/19/2021, 12:31 PM   Midwife attestation: I was gloved and present for delivery in its entirety and I agree with the above resident's note.  Dr Ephriam Jenkins was present for delivery of placenta and IUD placement.  Sharen Counter, CNM 10:04 AM

## 2020-11-10 ENCOUNTER — Other Ambulatory Visit: Payer: Self-pay | Admitting: Student

## 2020-11-10 ENCOUNTER — Other Ambulatory Visit: Payer: Self-pay

## 2020-11-10 ENCOUNTER — Inpatient Hospital Stay (HOSPITAL_COMMUNITY)
Admission: AD | Admit: 2020-11-10 | Discharge: 2020-11-10 | Disposition: A | Discharge status: Home / Self Care | Payer: Medicaid Other | Attending: Obstetrics and Gynecology | Admitting: Obstetrics and Gynecology

## 2020-11-10 DIAGNOSIS — Z3202 Encounter for pregnancy test, result negative: Secondary | ICD-10-CM | POA: Diagnosis not present

## 2020-11-10 DIAGNOSIS — N912 Amenorrhea, unspecified: Secondary | ICD-10-CM | POA: Insufficient documentation

## 2020-11-10 DIAGNOSIS — R109 Unspecified abdominal pain: Secondary | ICD-10-CM

## 2020-11-10 LAB — COMPREHENSIVE METABOLIC PANEL
ALT: 11 U/L (ref 0–44)
AST: 14 U/L — ABNORMAL LOW (ref 15–41)
Albumin: 4 g/dL (ref 3.5–5.0)
Alkaline Phosphatase: 37 U/L — ABNORMAL LOW (ref 38–126)
Anion gap: 8 (ref 5–15)
BUN: 11 mg/dL (ref 6–20)
CO2: 24 mmol/L (ref 22–32)
Calcium: 9 mg/dL (ref 8.9–10.3)
Chloride: 104 mmol/L (ref 98–111)
Creatinine, Ser: 0.62 mg/dL (ref 0.44–1.00)
GFR, Estimated: >60 mL/min (ref >60)
Glucose, Bld: 103 mg/dL — ABNORMAL HIGH (ref 70–99)
Potassium: 3.7 mmol/L (ref 3.5–5.1)
Sodium: 136 mmol/L (ref 135–145)
Total Bilirubin: 0.5 mg/dL (ref 0.3–1.2)
Total Protein: 7 g/dL (ref 6.5–8.1)

## 2020-11-10 LAB — CBC
HCT: 34.8 % — ABNORMAL LOW (ref 36.0–46.0)
Hemoglobin: 12.2 g/dL (ref 12.0–15.0)
MCH: 34.2 pg — ABNORMAL HIGH (ref 26.0–34.0)
MCHC: 35.1 g/dL (ref 30.0–36.0)
MCV: 97.5 fL (ref 80.0–100.0)
Platelets: 196 10*3/uL (ref 150–400)
RBC: 3.57 MIL/uL — ABNORMAL LOW (ref 3.87–5.11)
RDW: 11.9 % (ref 11.5–15.5)
WBC: 6.1 10*3/uL (ref 4.0–10.5)
nRBC: 0 % (ref 0.0–0.2)

## 2020-11-10 LAB — HCG, QUANTITATIVE, PREGNANCY: hCG, Beta Chain, Quant, S: <1 m[IU]/mL (ref <5)

## 2020-11-10 LAB — POCT PREGNANCY, URINE: Preg Test, Ur: NEGATIVE

## 2020-11-10 NOTE — MAU Provider Note (Cosign Needed)
   S Ms. NICHA HEMANN is a 25 y.o. [phone] patient who presents to MAU today with complaint of late period. Patient reports her period was one week late, so she took a home pregnancy test and got a faint positive after about 5 minutes. Patient denies pain or bleeding and reports she only came in to MAU to see if she was really pregnant.  O BP 122/66   Pulse 87   Temp 98.4 F (36.9 C)   Resp 18   Ht 5\' 3"  (1.6 m)   Wt 65.2 kg   LMP 10/04/2020   BMI 25.46 kg/m    Physical Exam Vitals and nursing note reviewed.  Constitutional:      Appearance: Normal appearance.  HENT:     Head: Normocephalic and atraumatic.  Pulmonary:     Effort: Pulmonary effort is normal.  Neurological:     Mental Status: She is alert and oriented to person, place, and time.  Psychiatric:        Mood and Affect: Mood normal.        Behavior: Behavior normal.        Thought Content: Thought content normal.        Judgment: Judgment normal.    A Medical screening exam complete UPT negative HCG ordered, will send MyChart message to patient with results, next steps  P Discharge from MAU in stable condition Warning signs for worsening condition that would warrant emergency follow-up discussed Patient may return to MAU as needed   Blaise Palladino, 14/11/2019, NP 11/10/2020 5:52 PM

## 2020-11-10 NOTE — MAU Note (Signed)
Pt reports she had a positive UPT at home. C/O abd pain and cramping x 1-1.5 weeks. Denies any vag bleeding reports small amount of vag discharge.

## 2020-11-17 ENCOUNTER — Encounter (HOSPITAL_COMMUNITY): Payer: Self-pay

## 2020-11-17 ENCOUNTER — Ambulatory Visit (HOSPITAL_COMMUNITY)
Admission: EM | Admit: 2020-11-17 | Discharge: 2020-11-17 | Disposition: A | Discharge status: Home / Self Care | Payer: Medicaid Other | Attending: Emergency Medicine | Admitting: Emergency Medicine

## 2020-11-17 DIAGNOSIS — R350 Frequency of micturition: Secondary | ICD-10-CM | POA: Insufficient documentation

## 2020-11-17 DIAGNOSIS — Z113 Encounter for screening for infections with a predominantly sexual mode of transmission: Secondary | ICD-10-CM | POA: Insufficient documentation

## 2020-11-17 DIAGNOSIS — R300 Dysuria: Secondary | ICD-10-CM

## 2020-11-17 DIAGNOSIS — Z3202 Encounter for pregnancy test, result negative: Secondary | ICD-10-CM

## 2020-11-17 LAB — POCT URINALYSIS DIPSTICK, ED / UC
Bilirubin Urine: NEGATIVE
Glucose, UA: NEGATIVE mg/dL
Hgb urine dipstick: NEGATIVE
Ketones, ur: NEGATIVE mg/dL
Nitrite: NEGATIVE
Protein, ur: NEGATIVE mg/dL
Specific Gravity, Urine: 1.015 (ref 1.005–1.030)
Urobilinogen, UA: 0.2 mg/dL (ref 0.0–1.0)
pH: 6.5 (ref 5.0–8.0)

## 2020-11-17 LAB — POC URINE PREG, ED: Preg Test, Ur: NEGATIVE

## 2020-11-17 NOTE — Discharge Instructions (Addendum)
The urine culture is pending.  We will call you if it is positive requiring treatment.     Your vaginal tests are pending.  If your test results are positive, we will call you.  You and your sexual partner(s) may require treatment at that time.  Do not have sexual activity until the test results are back.

## 2020-11-17 NOTE — ED Provider Notes (Signed)
[facility]    CSN: [id number] Arrival date & time: 11/17/20  0805      History   Chief Complaint Chief Complaint  Patient presents with  . Dysuria  . SEXUALLY TRANSMITTED DISEASE    HPI Eileen Graham is a 25 y.o. female.   Patient presents with urinary urgency and malodorous urine x2 days.  She denies fever, chills, abdominal pain, dysuria, flank pain, vaginal discharge, pelvic pain, rash, lesions, or other symptoms.  No treatments attempted at home.  She is sexually active and would like to be tested for STDs also.  Medical history includes bacterial vaginosis, asthma, alopecia, shingles.  The history is provided by the patient and medical records.    Past Medical History:  Diagnosis Date  . Alopecia   . Asthma   . Shingles     Patient Active Problem List   Diagnosis Date Noted  . Intrauterine pregnancy 07/19/2019  . Morning sickness 07/19/2019  . Insomnia 07/19/2019  . Bacterial vaginosis 07/19/2019    Past Surgical History:  Procedure Laterality Date  . NO PAST SURGERIES      OB History    Gravida  3   Para  1   Term  1   Preterm      AB  1   Living  1     SAB      IAB  1   Ectopic      Multiple      Live Births  1            Home Medications    Prior to Admission medications   Medication Sig Start Date End Date Taking? Authorizing Provider  albuterol (PROAIR HFA) 108 (90 Base) MCG/ACT inhaler Inhale 1-2 puffs into the lungs every 6 (six) hours as needed for wheezing or shortness of breath. 12/17/19   Georgetta Haber, NP  albuterol (VENTOLIN HFA) 108 (90 Base) MCG/ACT inhaler Inhale 1-2 puffs into the lungs every 6 (six) hours as needed for wheezing or shortness of breath. 06/12/20   Eustace Moore, MD  HYDROcodone-acetaminophen (NORCO/VICODIN) 5-325 MG tablet Take 1-2 tablets by mouth every 6 (six) hours as needed. 06/12/20   Eustace Moore, MD  ibuprofen (ADVIL) 600 MG tablet Take 1 tablet (600 mg total) by mouth  every 6 (six) hours as needed. 06/12/20   Eustace Moore, MD  tiZANidine (ZANAFLEX) 4 MG tablet Take 1-2 tablets (4-8 mg total) by mouth every 6 (six) hours as needed for muscle spasms. 06/12/20   Eustace Moore, MD  famotidine (PEPCID) 40 MG tablet Take 1 tablet (40 mg total) by mouth every evening. 08/09/19 06/12/20  Nugent, Odie Sera, NP  glycopyrrolate (ROBINUL) 2 MG tablet Take 1 tablet (2 mg total) by mouth 3 (three) times daily as needed. 08/07/19 06/12/20  Leftwich-Kirby, Wilmer Floor, CNM  loratadine (CLARITIN) 10 MG tablet Take 1 tablet (10 mg total) by mouth daily. 02/19/19 06/12/20  Wieters, Hallie C, PA-C  metoCLOPramide (REGLAN) 10 MG tablet Take 1 tablet (10 mg total) by mouth 3 (three) times daily with meals. 08/09/19 06/12/20  Nugent, Odie Sera, NP  montelukast (SINGULAIR) 10 MG tablet Take 1 tablet (10 mg total) by mouth at bedtime. Patient not taking: Reported on 07/14/2019 02/19/19 06/12/20  Wieters, Hallie C, PA-C  potassium chloride (KLOR-CON) 10 MEQ tablet Take 1 tablet (10 mEq total) by mouth daily. 08/09/19 06/12/20  Nugent, Odie Sera, NP    Family History Family History  Problem  Relation Age of Onset  . Asthma Maternal Grandfather   . Congestive Heart Failure Maternal Grandfather     Social History Social History   Tobacco Use  . Smoking status: Former Smoker    Types: Cigarettes  . Smokeless tobacco: Never Used  Vaping Use  . Vaping Use: Never used  Substance Use Topics  . Alcohol use: Yes  . Drug use: Not Currently    Types: Marijuana    Comment: August 2020     Allergies   Patient has no known allergies.   Review of Systems Review of Systems  Constitutional: Negative for chills and fever.  HENT: Negative for ear pain and sore throat.   Eyes: Negative for pain and visual disturbance.  Respiratory: Negative for cough and shortness of breath.   Cardiovascular: Negative for chest pain and palpitations.  Gastrointestinal: Negative for abdominal pain and vomiting.   Genitourinary: Positive for frequency. Negative for dysuria, flank pain, hematuria, pelvic pain and vaginal discharge.  Musculoskeletal: Negative for arthralgias and back pain.  Skin: Negative for color change and rash.  Neurological: Negative for seizures and syncope.  All other systems reviewed and are negative.    Physical Exam Triage Vital Signs ED Triage Vitals [11/17/20 0817]  Enc Vitals Group     BP      Pulse      Resp      Temp      Temp src      SpO2      Weight      Height      Head Circumference      Peak Flow      Pain Score 0     Pain Loc      Pain Edu?      Excl. in GC?    No data found.  Updated Vital Signs BP 119/82 (BP Location: Left Arm)   Pulse 94   Temp 97.7 F (36.5 C) (Oral)   Resp 16   LMP 11/10/2020   SpO2 98%   Visual Acuity Right Eye Distance:   Left Eye Distance:   Bilateral Distance:    Right Eye Near:   Left Eye Near:    Bilateral Near:     Physical Exam Vitals and nursing note reviewed.  Constitutional:      General: She is not in acute distress.    Appearance: She is well-developed and well-nourished. She is not ill-appearing.  HENT:     Head: Normocephalic and atraumatic.     Mouth/Throat:     Mouth: Mucous membranes are moist.  Eyes:     Conjunctiva/sclera: Conjunctivae normal.  Cardiovascular:     Rate and Rhythm: Normal rate and regular rhythm.     Heart sounds: Normal heart sounds.  Pulmonary:     Effort: Pulmonary effort is normal. No respiratory distress.     Breath sounds: Normal breath sounds.  Abdominal:     Palpations: Abdomen is soft.     Tenderness: There is no abdominal tenderness. There is no right CVA tenderness, left CVA tenderness, guarding or rebound.  Musculoskeletal:        General: No edema.     Cervical back: Neck supple.  Skin:    General: Skin is warm and dry.  Neurological:     General: No focal deficit present.     Mental Status: She is alert and oriented to person, place, and time.   Psychiatric:        Mood and Affect:  Mood and affect and mood normal.        Behavior: Behavior normal.      UC Treatments / Results  Labs (all labs ordered are listed, but only abnormal results are displayed) Labs Reviewed  POCT URINALYSIS DIPSTICK, ED / UC - Abnormal; Notable for the following components:      Result Value   Leukocytes,Ua TRACE (*)    All other components within normal limits  URINE CULTURE  POC URINE PREG, ED  CERVICOVAGINAL ANCILLARY ONLY    EKG   Radiology No results found.  Procedures Procedures (including critical care time)  Medications Ordered in UC Medications - No data to display  Initial Impression / Assessment and Plan / UC Course  I have reviewed the triage vital signs and the nursing notes.  Pertinent labs & imaging results that were available during my care of the patient were reviewed by me and considered in my medical decision making (see chart for details).   Urinary frequency.  Screening for STDs.  Urine culture pending.  Urine pregnancy negative.  Patient obtained vaginal self swab for STD testing.  Instructed her to abstain from sexual activity until her test results are back.  Discussed that she and her sexual partner may require treatment at that time.  She agrees to plan of care.   Final Clinical Impressions(s) / UC Diagnoses   Final diagnoses:  Urinary frequency  Screening for STD (sexually transmitted disease)     Discharge Instructions     The urine culture is pending.  We will call you if it is positive requiring treatment.     Your vaginal tests are pending.  If your test results are positive, we will call you.  You and your sexual partner(s) may require treatment at that time.  Do not have sexual activity until the test results are back.         ED Prescriptions    None     PDMP not reviewed this encounter.   Mickie Bail, NP 11/17/20 [phone]

## 2020-11-17 NOTE — ED Triage Notes (Signed)
Pt c/o occasional urinary urgency and "strong urine" for the past couple days and recently had intercourse with a new partner and requesting STI testing.  Denies fever, n/v/d, vaginal discharge, hematuria, burning with urination, abdominal/flank/back pain. No OTC meds for symptoms

## 2020-11-19 LAB — URINE CULTURE: Culture: >=100000 — AB

## 2020-11-20 ENCOUNTER — Telehealth (HOSPITAL_COMMUNITY): Payer: Self-pay | Admitting: Emergency Medicine

## 2020-11-20 LAB — CERVICOVAGINAL ANCILLARY ONLY
Bacterial Vaginitis (gardnerella): POSITIVE — AB
Candida Glabrata: NEGATIVE
Candida Vaginitis: POSITIVE — AB
Chlamydia: NEGATIVE
Comment: NEGATIVE
Comment: NEGATIVE
Comment: NEGATIVE
Comment: NEGATIVE
Comment: NEGATIVE
Comment: NORMAL
Neisseria Gonorrhea: NEGATIVE
Trichomonas: POSITIVE — AB

## 2020-11-20 MED ORDER — METRONIDAZOLE 500 MG PO TABS
500.0000 mg | ORAL_TABLET | Freq: Two times a day (BID) | ORAL | 0 refills | Status: DC
Start: 2020-11-20 — End: 2021-01-17

## 2020-11-20 MED ORDER — NITROFURANTOIN MONOHYD MACRO 100 MG PO CAPS
100.0000 mg | ORAL_CAPSULE | Freq: Two times a day (BID) | ORAL | 0 refills | Status: DC
Start: 2020-11-20 — End: 2021-01-19

## 2020-11-20 MED ORDER — FLUCONAZOLE 150 MG PO TABS
150.0000 mg | ORAL_TABLET | Freq: Once | ORAL | 0 refills | Status: AC
Start: 2020-11-20 — End: 2020-11-20

## 2020-12-07 ENCOUNTER — Encounter (HOSPITAL_COMMUNITY): Payer: Self-pay

## 2020-12-07 ENCOUNTER — Emergency Department (HOSPITAL_COMMUNITY): Payer: No Typology Code available for payment source

## 2020-12-07 ENCOUNTER — Encounter (HOSPITAL_COMMUNITY): Payer: Self-pay | Admitting: *Deleted

## 2020-12-07 ENCOUNTER — Other Ambulatory Visit: Payer: Self-pay

## 2020-12-07 ENCOUNTER — Emergency Department (HOSPITAL_COMMUNITY)
Admission: EM | Admit: 2020-12-07 | Discharge: 2020-12-07 | Disposition: A | Discharge status: Home / Self Care | Payer: No Typology Code available for payment source | Attending: Emergency Medicine | Admitting: Emergency Medicine

## 2020-12-07 DIAGNOSIS — K148 Other diseases of tongue: Secondary | ICD-10-CM | POA: Diagnosis not present

## 2020-12-07 DIAGNOSIS — S299XXA Unspecified injury of thorax, initial encounter: Secondary | ICD-10-CM | POA: Diagnosis present

## 2020-12-07 DIAGNOSIS — R4182 Altered mental status, unspecified: Secondary | ICD-10-CM | POA: Insufficient documentation

## 2020-12-07 DIAGNOSIS — J45909 Unspecified asthma, uncomplicated: Secondary | ICD-10-CM | POA: Insufficient documentation

## 2020-12-07 DIAGNOSIS — S2020XA Contusion of thorax, unspecified, initial encounter: Secondary | ICD-10-CM | POA: Diagnosis not present

## 2020-12-07 DIAGNOSIS — Y92410 Unspecified street and highway as the place of occurrence of the external cause: Secondary | ICD-10-CM | POA: Diagnosis not present

## 2020-12-07 DIAGNOSIS — M542 Cervicalgia: Secondary | ICD-10-CM | POA: Diagnosis not present

## 2020-12-07 LAB — CBC
HCT: 36.3 % (ref 36.0–46.0)
Hemoglobin: 12.5 g/dL (ref 12.0–15.0)
MCH: 34.2 pg — ABNORMAL HIGH (ref 26.0–34.0)
MCHC: 34.4 g/dL (ref 30.0–36.0)
MCV: 99.2 fL (ref 80.0–100.0)
Platelets: 192 10*3/uL (ref 150–400)
RBC: 3.66 MIL/uL — ABNORMAL LOW (ref 3.87–5.11)
RDW: 11.8 % (ref 11.5–15.5)
WBC: 7.5 10*3/uL (ref 4.0–10.5)
nRBC: 0 % (ref 0.0–0.2)

## 2020-12-07 LAB — COMPREHENSIVE METABOLIC PANEL
ALT: 24 U/L (ref 0–44)
AST: 35 U/L (ref 15–41)
Albumin: 4.1 g/dL (ref 3.5–5.0)
Alkaline Phosphatase: 44 U/L (ref 38–126)
Anion gap: 13 (ref 5–15)
BUN: 10 mg/dL (ref 6–20)
CO2: 20 mmol/L — ABNORMAL LOW (ref 22–32)
Calcium: 8.6 mg/dL — ABNORMAL LOW (ref 8.9–10.3)
Chloride: 105 mmol/L (ref 98–111)
Creatinine, Ser: 0.71 mg/dL (ref 0.44–1.00)
GFR, Estimated: >60 mL/min (ref >60)
Glucose, Bld: 125 mg/dL — ABNORMAL HIGH (ref 70–99)
Potassium: 3.3 mmol/L — ABNORMAL LOW (ref 3.5–5.1)
Sodium: 138 mmol/L (ref 135–145)
Total Bilirubin: 0.4 mg/dL (ref 0.3–1.2)
Total Protein: 7.1 g/dL (ref 6.5–8.1)

## 2020-12-07 LAB — I-STAT CHEM 8, ED
BUN: 11 mg/dL (ref 6–20)
Calcium, Ion: 1.03 mmol/L — ABNORMAL LOW (ref 1.15–1.40)
Chloride: 107 mmol/L (ref 98–111)
Creatinine, Ser: 0.9 mg/dL (ref 0.44–1.00)
Glucose, Bld: 119 mg/dL — ABNORMAL HIGH (ref 70–99)
HCT: 38 % (ref 36.0–46.0)
Hemoglobin: 12.9 g/dL (ref 12.0–15.0)
Potassium: 3.3 mmol/L — ABNORMAL LOW (ref 3.5–5.1)
Sodium: 141 mmol/L (ref 135–145)
TCO2: 20 mmol/L — ABNORMAL LOW (ref 22–32)

## 2020-12-07 LAB — PROTIME-INR
INR: 1 (ref 0.8–1.2)
Prothrombin Time: 12.6 seconds (ref 11.4–15.2)

## 2020-12-07 LAB — LACTIC ACID, PLASMA: Lactic Acid, Venous: 2.5 mmol/L (ref 0.5–1.9)

## 2020-12-07 LAB — I-STAT BETA HCG BLOOD, ED (MC, WL, AP ONLY): I-stat hCG, quantitative: <5 m[IU]/mL (ref <5)

## 2020-12-07 LAB — ETHANOL: Alcohol, Ethyl (B): 226 mg/dL — ABNORMAL HIGH (ref <10)

## 2020-12-07 LAB — SAMPLE TO BLOOD BANK

## 2020-12-07 MED ORDER — ALBUTEROL SULFATE HFA 108 (90 BASE) MCG/ACT IN AERS
2.0000 | INHALATION_SPRAY | RESPIRATORY_TRACT | 1 refills | Status: DC | PRN
Start: 2020-12-07 — End: 2021-01-17

## 2020-12-07 MED ORDER — IOHEXOL 300 MG/ML  SOLN
100.0000 mL | Freq: Once | INTRAMUSCULAR | Status: AC | PRN
  Administered 2020-12-07: 100 mL via INTRAVENOUS

## 2020-12-07 MED ORDER — ALBUTEROL SULFATE HFA 108 (90 BASE) MCG/ACT IN AERS
2.0000 | INHALATION_SPRAY | Freq: Once | RESPIRATORY_TRACT | Status: AC
  Administered 2020-12-07: 2 via RESPIRATORY_TRACT

## 2020-12-07 MED ORDER — ALBUTEROL SULFATE HFA 108 (90 BASE) MCG/ACT IN AERS
INHALATION_SPRAY | RESPIRATORY_TRACT | Status: AC
  Filled 2020-12-07: qty 6.7

## 2020-12-07 MED ORDER — BENZOCAINE 20 % MT AERO
INHALATION_SPRAY | OROMUCOSAL | Status: DC | PRN
  Filled 2020-12-07: qty 57

## 2020-12-07 MED ORDER — SODIUM CHLORIDE 0.9 % IV BOLUS
1000.0000 mL | Freq: Once | INTRAVENOUS | Status: AC
  Administered 2020-12-07: 1000 mL via INTRAVENOUS

## 2020-12-07 MED ORDER — FENTANYL CITRATE (PF) 100 MCG/2ML IJ SOLN
25.0000 ug | Freq: Once | INTRAMUSCULAR | Status: AC
  Administered 2020-12-07: 25 ug via INTRAVENOUS
  Filled 2020-12-07: qty 2
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
## 2020-12-07 NOTE — Discharge Instructions (Addendum)
You were evaluated in the Emergency Department and after careful evaluation, we did not find any emergent condition requiring admission or further testing in the hospital.  Your exam/testing today was overall reassuring.  Please return to the Emergency Department if you experience any worsening of your condition.  Thank you for allowing us to be a part of your care.  

## 2020-12-07 NOTE — Consult Note (Signed)
Responded to page, pt unavailable, no family present, staff will page again if further chaplain services needed.  Rev. Rogerio Boutelle Chaplain 

## 2020-12-07 NOTE — ED Notes (Signed)
Pt ambulatory to restroom; steady gait noted with soreness to bilateral knees. Pt requesting to go home at this time

## 2021-01-17 ENCOUNTER — Encounter (HOSPITAL_COMMUNITY): Payer: Self-pay

## 2021-01-17 ENCOUNTER — Ambulatory Visit (HOSPITAL_COMMUNITY)
Admission: EM | Admit: 2021-01-17 | Discharge: 2021-01-17 | Disposition: A | Discharge status: Home / Self Care | Payer: Medicaid Other | Attending: Medical Oncology | Admitting: Medical Oncology

## 2021-01-17 ENCOUNTER — Other Ambulatory Visit: Payer: Self-pay

## 2021-01-17 DIAGNOSIS — Z760 Encounter for issue of repeat prescription: Secondary | ICD-10-CM | POA: Insufficient documentation

## 2021-01-17 DIAGNOSIS — N898 Other specified noninflammatory disorders of vagina: Secondary | ICD-10-CM | POA: Insufficient documentation

## 2021-01-17 DIAGNOSIS — R350 Frequency of micturition: Secondary | ICD-10-CM | POA: Insufficient documentation

## 2021-01-17 LAB — POCT URINALYSIS DIPSTICK, ED / UC
Bilirubin Urine: NEGATIVE
Glucose, UA: NEGATIVE mg/dL
Hgb urine dipstick: NEGATIVE
Ketones, ur: NEGATIVE mg/dL
Leukocytes,Ua: NEGATIVE
Nitrite: POSITIVE — AB
Protein, ur: NEGATIVE mg/dL
Specific Gravity, Urine: >=1.03 (ref 1.005–1.030)
Urobilinogen, UA: 0.2 mg/dL (ref 0.0–1.0)
pH: 5.5 (ref 5.0–8.0)

## 2021-01-17 MED ORDER — ALBUTEROL SULFATE HFA 108 (90 BASE) MCG/ACT IN AERS
2.0000 | INHALATION_SPRAY | RESPIRATORY_TRACT | 0 refills | Status: DC | PRN
Start: 2021-01-17 — End: 2021-03-21

## 2021-01-17 MED ORDER — METRONIDAZOLE 500 MG PO TABS
500.0000 mg | ORAL_TABLET | Freq: Two times a day (BID) | ORAL | 0 refills | Status: DC
Start: 2021-01-17 — End: 2021-03-21

## 2021-01-17 NOTE — ED Triage Notes (Addendum)
Pt reports white vaginal discharge, increased urinary frequency x 3-4 days.   Pt requested STD's testing.   Pt requested albuterol inhaler refill.

## 2021-01-17 NOTE — Discharge Instructions (Addendum)
Your vaginal tests are pending.  If your test results are positive, we will call you.  You and your sexual partner(s) may require treatment at that time.  Do not have sexual activity for at least 7 days.    Use the albuterol inhaler as directed.    Establish a primary care provider.  Assistance from [facility] health has been requested.

## 2021-01-17 NOTE — ED Provider Notes (Signed)
[facility]    CSN: [id number] Arrival date & time: 01/17/21  0901      History   Chief Complaint Chief Complaint  Patient presents with  . Vaginal Discharge    HPI Eileen Graham is a 25 y.o. female.   Patient presents with white malodorous vaginal discharge x4 days.  She also reports urinary frequency.  She request STD testing.  She denies fever, chills, abdominal pain, dysuria, pelvic pain, rash, lesions, or other symptoms.  Patient also requests a refill on her albuterol inhaler.  She does not have a PCP as she recently moved here.  Her medical history includes asthma.  The history is provided by the patient and medical records.    Past Medical History:  Diagnosis Date  . Alopecia   . Asthma   . Shingles     Patient Active Problem List   Diagnosis Date Noted  . Intrauterine pregnancy 07/19/2019  . Morning sickness 07/19/2019  . Insomnia 07/19/2019  . Bacterial vaginosis 07/19/2019    Past Surgical History:  Procedure Laterality Date  . NO PAST SURGERIES      OB History    Gravida  3   Para  1   Term  1   Preterm  0   AB  1   Living  1     SAB  0   IAB  1   Ectopic  0   Multiple      Live Births  1            Home Medications    Prior to Admission medications   Medication Sig Start Date End Date Taking? Authorizing Provider  albuterol (VENTOLIN HFA) 108 (90 Base) MCG/ACT inhaler Inhale 2 puffs into the lungs every 4 (four) hours as needed for wheezing or shortness of breath. 01/17/21  Yes Mickie Bail, NP  metroNIDAZOLE (FLAGYL) 500 MG tablet Take 1 tablet (500 mg total) by mouth 2 (two) times daily. 01/17/21  Yes Mickie Bail, NP  HYDROcodone-acetaminophen (NORCO/VICODIN) 5-325 MG tablet Take 1-2 tablets by mouth every 6 (six) hours as needed. 06/12/20   Eustace Moore, MD  ibuprofen (ADVIL) 600 MG tablet Take 1 tablet (600 mg total) by mouth every 6 (six) hours as needed. 06/12/20   Eustace Moore, MD  nitrofurantoin,  macrocrystal-monohydrate, (MACROBID) 100 MG capsule Take 1 capsule (100 mg total) by mouth 2 (two) times daily. 11/20/20   Lamptey, Britta Mccreedy, MD  tiZANidine (ZANAFLEX) 4 MG tablet Take 1-2 tablets (4-8 mg total) by mouth every 6 (six) hours as needed for muscle spasms. 06/12/20   Eustace Moore, MD  famotidine (PEPCID) 40 MG tablet Take 1 tablet (40 mg total) by mouth every evening. 08/09/19 06/12/20  Nugent, Odie Sera, NP  glycopyrrolate (ROBINUL) 2 MG tablet Take 1 tablet (2 mg total) by mouth 3 (three) times daily as needed. 08/07/19 06/12/20  Leftwich-Kirby, Wilmer Floor, CNM  loratadine (CLARITIN) 10 MG tablet Take 1 tablet (10 mg total) by mouth daily. 02/19/19 06/12/20  Wieters, Hallie C, PA-C  metoCLOPramide (REGLAN) 10 MG tablet Take 1 tablet (10 mg total) by mouth 3 (three) times daily with meals. 08/09/19 06/12/20  Nugent, Odie Sera, NP  montelukast (SINGULAIR) 10 MG tablet Take 1 tablet (10 mg total) by mouth at bedtime. Patient not taking: Reported on 07/14/2019 02/19/19 06/12/20  Wieters, Hallie C, PA-C  potassium chloride (KLOR-CON) 10 MEQ tablet Take 1 tablet (10 mEq total) by mouth daily. 08/09/19  06/12/20  Nugent, Odie Sera, NP    Family History Family History  Problem Relation Age of Onset  . Asthma Maternal Grandfather   . Congestive Heart Failure Maternal Grandfather     Social History Social History   Tobacco Use  . Smoking status: Former Smoker    Types: Cigarettes  . Smokeless tobacco: Never Used  Vaping Use  . Vaping Use: Never used  Substance Use Topics  . Alcohol use: Yes  . Drug use: Not Currently    Types: Marijuana    Comment: August 2020     Allergies   Patient has no known allergies.   Review of Systems Review of Systems  Constitutional: Negative for chills and fever.  HENT: Negative for ear pain and sore throat.   Eyes: Negative for pain and visual disturbance.  Respiratory: Negative for cough and shortness of breath.   Cardiovascular: Negative for chest pain and  palpitations.  Gastrointestinal: Negative for abdominal pain and vomiting.  Genitourinary: Positive for frequency and vaginal discharge. Negative for dysuria, flank pain, hematuria and pelvic pain.  Musculoskeletal: Negative for arthralgias and back pain.  Skin: Negative for color change and rash.  Neurological: Negative for seizures and syncope.  All other systems reviewed and are negative.    Physical Exam Triage Vital Signs ED Triage Vitals  Enc Vitals Group     BP 01/17/21 0918 114/62     Pulse Rate 01/17/21 0918 98     Resp 01/17/21 0918 15     Temp 01/17/21 0918 98.5 F (36.9 C)     Temp Source 01/17/21 0918 Oral     SpO2 01/17/21 0918 98 %     Weight --      Height --      Head Circumference --      Peak Flow --      Pain Score 01/17/21 0915 0     Pain Loc --      Pain Edu? --      Excl. in GC? --    No data found.  Updated Vital Signs BP 114/62 (BP Location: Left Arm)   Pulse 98   Temp 98.5 F (36.9 C) (Oral)   Resp 15   LMP 12/28/2020 (Exact Date)   SpO2 98%   Visual Acuity Right Eye Distance:   Left Eye Distance:   Bilateral Distance:    Right Eye Near:   Left Eye Near:    Bilateral Near:     Physical Exam Vitals and nursing note reviewed.  Constitutional:      General: She is not in acute distress.    Appearance: She is well-developed. She is not ill-appearing.  HENT:     Head: Normocephalic and atraumatic.     Mouth/Throat:     Mouth: Mucous membranes are moist.  Eyes:     Conjunctiva/sclera: Conjunctivae normal.  Cardiovascular:     Rate and Rhythm: Normal rate and regular rhythm.     Heart sounds: Normal heart sounds.  Pulmonary:     Effort: Pulmonary effort is normal. No respiratory distress.     Breath sounds: Normal breath sounds.  Abdominal:     Palpations: Abdomen is soft.     Tenderness: There is no abdominal tenderness. There is no right CVA tenderness, left CVA tenderness, guarding or rebound.  Musculoskeletal:     Cervical  back: Neck supple.  Skin:    General: Skin is warm and dry.  Neurological:     General: No focal  deficit present.     Mental Status: She is alert and oriented to person, place, and time.     Gait: Gait normal.  Psychiatric:        Mood and Affect: Mood normal.        Behavior: Behavior normal.      UC Treatments / Results  Labs (all labs ordered are listed, but only abnormal results are displayed) Labs Reviewed  POCT URINALYSIS DIPSTICK, ED / UC - Abnormal; Notable for the following components:      Result Value   Nitrite POSITIVE (*)    All other components within normal limits  URINE CULTURE  CERVICOVAGINAL ANCILLARY ONLY    EKG   Radiology No results found.  Procedures Procedures (including critical care time)  Medications Ordered in UC Medications - No data to display  Initial Impression / Assessment and Plan / UC Course  I have reviewed the triage vital signs and the nursing notes.  Pertinent labs & imaging results that were available during my care of the patient were reviewed by me and considered in my medical decision making (see chart for details).   Vaginal discharge, urinary frequency, medication refill.  Patient obtained vaginal self swab for testing.  Treating with metronidazole.  Discussed with patient that she may require additional treatment if her test results are positive.  Discussed that her sexual partner may also require treatment.  Instructed her to abstain from sexual activity for at least 7 days.  Instructed her to follow-up with a PCP or OB/GYN if her symptoms are not improving.  Assistance from [facility] health requested in establishing a PCP.  Urine culture pending.  Refill on albuterol inhaler provided.  Patient agrees to plan of care.   Final Clinical Impressions(s) / UC Diagnoses   Final diagnoses:  Vaginal discharge  Medication refill  Urinary frequency     Discharge Instructions     Your vaginal tests are pending.  If your test  results are positive, we will call you.  You and your sexual partner(s) may require treatment at that time.  Do not have sexual activity for at least 7 days.    Use the albuterol inhaler as directed.    Establish a primary care provider.  Assistance from [facility] health has been requested.          ED Prescriptions    Medication Sig Dispense Auth. Provider   metroNIDAZOLE (FLAGYL) 500 MG tablet Take 1 tablet (500 mg total) by mouth 2 (two) times daily. 14 tablet Mickie Bail, NP   albuterol (VENTOLIN HFA) 108 (90 Base) MCG/ACT inhaler Inhale 2 puffs into the lungs every 4 (four) hours as needed for wheezing or shortness of breath. 18 g Mickie Bail, NP     PDMP not reviewed this encounter.   Mickie Bail, NP 01/17/21 1011

## 2021-01-18 LAB — CERVICOVAGINAL ANCILLARY ONLY
Bacterial Vaginitis (gardnerella): POSITIVE — AB
Candida Glabrata: NEGATIVE
Candida Vaginitis: NEGATIVE
Chlamydia: NEGATIVE
Comment: NEGATIVE
Comment: NEGATIVE
Comment: NEGATIVE
Comment: NEGATIVE
Comment: NEGATIVE
Comment: NORMAL
Neisseria Gonorrhea: NEGATIVE
Trichomonas: NEGATIVE

## 2021-01-19 ENCOUNTER — Telehealth (HOSPITAL_COMMUNITY): Payer: Self-pay | Admitting: Emergency Medicine

## 2021-01-19 LAB — URINE CULTURE: Culture: >=100000 — AB

## 2021-01-19 MED ORDER — NITROFURANTOIN MONOHYD MACRO 100 MG PO CAPS: 100.0000 mg | ORAL_CAPSULE | Freq: Two times a day (BID) | ORAL | 0 refills | Status: DC

## 2021-02-06 ENCOUNTER — Inpatient Hospital Stay (HOSPITAL_COMMUNITY): Payer: Medicaid Other

## 2021-02-06 ENCOUNTER — Other Ambulatory Visit: Payer: Self-pay

## 2021-02-06 ENCOUNTER — Encounter (HOSPITAL_COMMUNITY): Payer: Self-pay | Admitting: Obstetrics & Gynecology

## 2021-02-06 ENCOUNTER — Inpatient Hospital Stay (HOSPITAL_COMMUNITY)
Admission: AD | Admit: 2021-02-06 | Discharge: 2021-02-06 | Disposition: A | Discharge status: Home / Self Care | Payer: Medicaid Other | Attending: Obstetrics & Gynecology | Admitting: Obstetrics & Gynecology

## 2021-02-06 DIAGNOSIS — Z3A01 Less than 8 weeks gestation of pregnancy: Secondary | ICD-10-CM | POA: Insufficient documentation

## 2021-02-06 DIAGNOSIS — O3680X0 Pregnancy with inconclusive fetal viability, not applicable or unspecified: Secondary | ICD-10-CM

## 2021-02-06 DIAGNOSIS — R109 Unspecified abdominal pain: Secondary | ICD-10-CM | POA: Diagnosis present

## 2021-02-06 DIAGNOSIS — Z6790 Unspecified blood type, Rh positive: Secondary | ICD-10-CM

## 2021-02-06 DIAGNOSIS — Z87891 Personal history of nicotine dependence: Secondary | ICD-10-CM | POA: Diagnosis not present

## 2021-02-06 DIAGNOSIS — O26891 Other specified pregnancy related conditions, first trimester: Secondary | ICD-10-CM | POA: Insufficient documentation

## 2021-02-06 DIAGNOSIS — R102 Pelvic and perineal pain: Secondary | ICD-10-CM

## 2021-02-06 DIAGNOSIS — O26899 Other specified pregnancy related conditions, unspecified trimester: Secondary | ICD-10-CM

## 2021-02-06 HISTORY — DX: Trichomoniasis, unspecified: A59.9

## 2021-02-06 HISTORY — DX: Unspecified infectious disease: B99.9

## 2021-02-06 HISTORY — DX: Headache, unspecified: R51.9

## 2021-02-06 LAB — URINALYSIS, ROUTINE W REFLEX MICROSCOPIC
Bilirubin Urine: NEGATIVE
Glucose, UA: NEGATIVE mg/dL
Hgb urine dipstick: NEGATIVE
Ketones, ur: NEGATIVE mg/dL
Leukocytes,Ua: NEGATIVE
Nitrite: NEGATIVE
Protein, ur: NEGATIVE mg/dL
Specific Gravity, Urine: 1.026 (ref 1.005–1.030)
pH: 5 (ref 5.0–8.0)

## 2021-02-06 LAB — WET PREP, GENITAL
Clue Cells Wet Prep HPF POC: NONE SEEN
Sperm: NONE SEEN
Trich, Wet Prep: NONE SEEN
Yeast Wet Prep HPF POC: NONE SEEN

## 2021-02-06 LAB — COMPREHENSIVE METABOLIC PANEL
ALT: 12 U/L (ref 0–44)
AST: 15 U/L (ref 15–41)
Albumin: 3.9 g/dL (ref 3.5–5.0)
Alkaline Phosphatase: 34 U/L — ABNORMAL LOW (ref 38–126)
Anion gap: 5 (ref 5–15)
BUN: 9 mg/dL (ref 6–20)
CO2: 25 mmol/L (ref 22–32)
Calcium: 9.1 mg/dL (ref 8.9–10.3)
Chloride: 103 mmol/L (ref 98–111)
Creatinine, Ser: 0.67 mg/dL (ref 0.44–1.00)
GFR, Estimated: >60 mL/min (ref >60)
Glucose, Bld: 85 mg/dL (ref 70–99)
Potassium: 3.9 mmol/L (ref 3.5–5.1)
Sodium: 133 mmol/L — ABNORMAL LOW (ref 135–145)
Total Bilirubin: 0.4 mg/dL (ref 0.3–1.2)
Total Protein: 6.9 g/dL (ref 6.5–8.1)

## 2021-02-06 LAB — CBC
HCT: 36.1 % (ref 36.0–46.0)
Hemoglobin: 12.5 g/dL (ref 12.0–15.0)
MCH: 33.3 pg (ref 26.0–34.0)
MCHC: 34.6 g/dL (ref 30.0–36.0)
MCV: 96.3 fL (ref 80.0–100.0)
Platelets: 218 10*3/uL (ref 150–400)
RBC: 3.75 MIL/uL — ABNORMAL LOW (ref 3.87–5.11)
RDW: 11.7 % (ref 11.5–15.5)
WBC: 7 10*3/uL (ref 4.0–10.5)
nRBC: 0 % (ref 0.0–0.2)

## 2021-02-06 LAB — HCG, QUANTITATIVE, PREGNANCY: hCG, Beta Chain, Quant, S: 24464 m[IU]/mL — ABNORMAL HIGH (ref <5)

## 2021-02-06 LAB — POCT PREGNANCY, URINE: Preg Test, Ur: POSITIVE — AB

## 2021-02-06 MED ORDER — ALBUTEROL SULFATE HFA 108 (90 BASE) MCG/ACT IN AERS: 2.0000 | INHALATION_SPRAY | Freq: Four times a day (QID) | RESPIRATORY_TRACT | 2 refills | Status: DC | PRN

## 2021-02-06 NOTE — MAU Provider Note (Signed)
History     CSN: [id number]  Arrival date and time: 02/06/21 1222   Event Date/Time   First Provider Initiated Contact with Patient 02/06/21 1351      Chief Complaint  Patient presents with  . Abdominal Pain   [name] is a 25 y.o. [phone] at 5149w5d who presents to MAU for pelvic cramping. Patient reports cramping started about one week ago and does not know if they are like menstrual cramps as she reports she does not get cramping with her period. Patient reports she has been taking ibuprofen for her cramping, which has not helped. Patient reports the pain is always present, but it does improve at times.  Pt denies VB, LOF, ctx, decreased FM, vaginal discharge/odor/itching. Pt denies N/V, abdominal pain, constipation, diarrhea, or urinary problems. Pt denies fever, chills, fatigue, sweating or changes in appetite. Pt denies SOB or chest pain. Pt denies dizziness, HA, light-headedness, weakness.  Allergies? none    OB History    Gravida  4   Para  1   Term  1   Preterm  0   AB  2   Living  1     SAB  0   IAB  2   Ectopic  0   Multiple      Live Births  1           Past Medical History:  Diagnosis Date  . Alopecia   . Asthma   . Headache   . Infection    UTI  . Shingles   . Trichomonas infection     Past Surgical History:  Procedure Laterality Date  . abortion     x2    Family History  Problem Relation Age of Onset  . Hypertension Mother   . Healthy Father   . Asthma Maternal Grandfather   . Congestive Heart Failure Maternal Grandfather     Social History   Tobacco Use  . Smoking status: Former Smoker    Types: Cigarettes  . Smokeless tobacco: Never Used  . Tobacco comment: "years ago"  Vaping Use  . Vaping Use: Former  . Devices: stopped with +UPT  Substance Use Topics  . Alcohol use: Not Currently  . Drug use: Not Currently    Types: Marijuana    Comment: August 2020    Allergies: No Known Allergies  No  medications prior to admission.    Review of Systems  Constitutional: Negative for chills, diaphoresis, fatigue and fever.  Eyes: Negative for visual disturbance.  Respiratory: Negative for shortness of breath.   Cardiovascular: Negative for chest pain.  Gastrointestinal: Negative for abdominal pain, constipation, diarrhea, nausea and vomiting.  Genitourinary: Positive for pelvic pain. Negative for dysuria, flank pain, frequency, urgency, vaginal bleeding and vaginal discharge.  Neurological: Negative for dizziness, weakness, light-headedness and headaches.   Physical Exam   Blood pressure 133/72, pulse 81, temperature 98.5 F (36.9 C), temperature source Oral, resp. rate 16, height 5\' 3"  (1.6 m), weight 67.3 kg, last menstrual period 12/28/2020, SpO2 100 %, unknown if currently breastfeeding.  Patient Vitals for the past 24 hrs:  BP Temp Temp src Pulse Resp SpO2 Height Weight  02/06/21 1553 133/72 -- -- 81 -- -- -- --  02/06/21 1311 123/71 -- -- 81 -- -- -- --  02/06/21 1235 110/60 98.5 F (36.9 C) Oral 87 16 100 % -- --  02/06/21 1231 -- -- -- -- -- -- 5\' 3"  (1.6 m) 67.3 kg   Physical  Exam Vitals and nursing note reviewed.  Constitutional:      General: She is not in acute distress.    Appearance: Normal appearance. She is not ill-appearing, toxic-appearing or diaphoretic.  HENT:     Head: Normocephalic and atraumatic.  Pulmonary:     Effort: Pulmonary effort is normal.  Neurological:     Mental Status: She is alert and oriented to person, place, and time.  Psychiatric:        Mood and Affect: Mood normal.        Behavior: Behavior normal.        Thought Content: Thought content normal.        Judgment: Judgment normal.    Results for orders placed or performed during the hospital encounter of 02/06/21 (from the past 24 hour(s))  Pregnancy, urine POC     Status: Abnormal   Collection Time: 02/06/21 12:47 PM  Result Value Ref Range   Preg Test, Ur POSITIVE (A) NEGATIVE   Urinalysis, Routine w reflex microscopic Urine, Clean Catch     Status: Abnormal   Collection Time: 02/06/21 12:52 PM  Result Value Ref Range   Color, Urine AMBER (A) YELLOW   APPearance HAZY (A) CLEAR   Specific Gravity, Urine 1.026 1.005 - 1.030   pH 5.0 5.0 - 8.0   Glucose, UA NEGATIVE NEGATIVE mg/dL   Hgb urine dipstick NEGATIVE NEGATIVE   Bilirubin Urine NEGATIVE NEGATIVE   Ketones, ur NEGATIVE NEGATIVE mg/dL   Protein, ur NEGATIVE NEGATIVE mg/dL   Nitrite NEGATIVE NEGATIVE   Leukocytes,Ua NEGATIVE NEGATIVE  CBC     Status: Abnormal   Collection Time: 02/06/21  1:47 PM  Result Value Ref Range   WBC 7.0 4.0 - 10.5 K/uL   RBC 3.75 (L) 3.87 - 5.11 MIL/uL   Hemoglobin 12.5 12.0 - 15.0 g/dL   HCT 27.0 35.0 - 09.3 %   MCV 96.3 80.0 - 100.0 fL   MCH 33.3 26.0 - 34.0 pg   MCHC 34.6 30.0 - 36.0 g/dL   RDW 81.8 29.9 - 37.1 %   Platelets 218 150 - 400 K/uL   nRBC 0.0 0.0 - 0.2 %  Comprehensive metabolic panel     Status: Abnormal   Collection Time: 02/06/21  1:47 PM  Result Value Ref Range   Sodium 133 (L) 135 - 145 mmol/L   Potassium 3.9 3.5 - 5.1 mmol/L   Chloride 103 98 - 111 mmol/L   CO2 25 22 - 32 mmol/L   Glucose, Bld 85 70 - 99 mg/dL   BUN 9 6 - 20 mg/dL   Creatinine, Ser 6.96 0.44 - 1.00 mg/dL   Calcium 9.1 8.9 - 78.9 mg/dL   Total Protein 6.9 6.5 - 8.1 g/dL   Albumin 3.9 3.5 - 5.0 g/dL   AST 15 15 - 41 U/L   ALT 12 0 - 44 U/L   Alkaline Phosphatase 34 (L) 38 - 126 U/L   Total Bilirubin 0.4 0.3 - 1.2 mg/dL   GFR, Estimated >38 >10 mL/min   Anion gap 5 5 - 15  hCG, quantitative, pregnancy     Status: Abnormal   Collection Time: 02/06/21  1:47 PM  Result Value Ref Range   hCG, Beta Chain, Quant, S 24,464 (H) <5 mIU/mL  Wet prep, genital     Status: Abnormal   Collection Time: 02/06/21  2:01 PM   Specimen: Vaginal  Result Value Ref Range   Yeast Wet Prep HPF POC NONE SEEN NONE SEEN  Trich, Wet Prep NONE SEEN NONE SEEN   Clue Cells Wet Prep HPF POC NONE SEEN  NONE SEEN   WBC, Wet Prep HPF POC FEW (A) NONE SEEN   Sperm NONE SEEN    US OB LESS THAN 14 WEEKS WITH OB TRANSVAGINAL  Result Date: 02/06/2021 CLINICAL DATA:  Pelvic pain for 1 week, pregnant, LMP 12/28/2020 EXAM: OBSTETRIC <14 WK [name] AND TRANSVAGINAL OB US TECHNIQUE: Both transabdominal and transvaginal ultrasound examinations were performed for complete evaluation of the gestation as well as the maternal uterus, adnexal regions, and pelvic cul-de-sac. Transvaginal technique was performed to assess early pregnancy. COMPARISON:  None FINDINGS: Intrauterine gestational sac: Present Yolk sac:  Not definitely visualize Embryo:  Questionably present Cardiac Activity: Not identified Heart Rate: N/A  bpm MSD: 11.4 mm   5 w   6 d Subchorionic hemorrhage:  None visualized. Maternal uterus/adnexae: RIGHT ovary normal size and morphology, 2.8 x 2.2 x 2.1 cm. LEFT ovary measures 3.3 x 2.8 x 2.2 cm and contains a small corpus luteum. No free pelvic fluid or adnexal masses. IMPRESSION: Gestational sac seen within uterus with mean sac diameter of 5 weeks 6 days EGA. Questionable fetal pole. Unable to establish fetal viability; may consider follow-up ultrasound in 14 days to establish viability if clinically indicated. Electronically Signed   By: Ulyses Southward M.D.   On: 02/06/2021 14:44    MAU Course  Procedures  MDM -r/o ectopic -UA: amber/hazy -CBC: no abnormalities requiring treatment -CMP: no abnormalities requiring treatment -[name]: PUL -hCG: 02,409 -ABO: O Positive -WetPrep: WNL -GC/CT collected -consulted with Dr. Charlotta Newton who recommends patient have repeat US in approximately 2 weeks -pt discharged to home in stable condition  -after provider discharged patient, pt requests RX for new inhaler. Patient states allergies have been causing her to use her inhaler more frequently. Patient has not been taking a daily allergy medication, but has been using Benadryl every other day. Patient advised will be given a  list of safe medications to take in pregnancy and should start taking daily allergy medicine and make appt with PCP to discuss further asthma control. Patient has no concerns of SOB or cough today. Patient reports was prescribed an albuterol inhaler 01/17/2021, but has already emptied the inhaler and has not used it for several days. Will send RX for albuterol and list of PCPs given as patient reports she does not have a PCP.  Orders Placed This Encounter  Procedures  . Wet prep, genital    Standing Status:   Standing    Number of Occurrences:   1  . US OB LESS THAN 14 WEEKS WITH OB TRANSVAGINAL    Standing Status:   Standing    Number of Occurrences:   1    Order Specific Question:   Symptom/Reason for Exam    Answer:   Pelvic cramping in antepartum period [[id number]]  . US OB LESS THAN 14 WEEKS WITH OB TRANSVAGINAL    Standing Status:   Future    Standing Expiration Date:   02/06/2022    Order Specific Question:   Reason for Exam (SYMPTOM  OR DIAGNOSIS REQUIRED)    Answer:   PUL    Order Specific Question:   Preferred Imaging Location?    Answer:   [facility]  . Urinalysis, Routine w reflex microscopic Urine, Clean Catch    Standing Status:   Standing    Number of Occurrences:   1  . CBC    Standing Status:  Standing    Number of Occurrences:   1  . Comprehensive metabolic panel    Standing Status:   Standing    Number of Occurrences:   1  . hCG, quantitative, pregnancy    Standing Status:   Standing    Number of Occurrences:   1  . Pregnancy, urine POC    Standing Status:   Standing    Number of Occurrences:   1  . Discharge patient    Order Specific Question:   Discharge disposition    Answer:   01-Home or Self Care [1]    Order Specific Question:   Discharge patient date    Answer:   02/06/2021   Meds ordered this encounter  Medications  . albuterol (VENTOLIN HFA) 108 (90 Base) MCG/ACT inhaler    Sig: Inhale 2 puffs into the lungs every 6 (six) hours as needed for  wheezing or shortness of breath.    Dispense:  8 g    Refill:  2    Order Specific Question:   Supervising Provider    Answer:   Levie Heritage [[id number]]   Assessment and Plan   1. Pregnancy of unknown anatomic location   2. Pelvic cramping in antepartum period   3. Blood type, Rh positive    Allergies as of 02/06/2021   No Known Allergies     Medication List    STOP taking these medications   ibuprofen 600 MG tablet Commonly known as: ADVIL     TAKE these medications   albuterol 108 (90 Base) MCG/ACT inhaler Commonly known as: VENTOLIN HFA Inhale 2 puffs into the lungs every 4 (four) hours as needed for wheezing or shortness of breath. What changed: Another medication with the same name was added. Make sure you understand how and when to take each.   albuterol 108 (90 Base) MCG/ACT inhaler Commonly known as: VENTOLIN HFA Inhale 2 puffs into the lungs every 6 (six) hours as needed for wheezing or shortness of breath. What changed: You were already taking a medication with the same name, and this prescription was added. Make sure you understand how and when to take each.   diphenhydrAMINE 25 MG tablet Commonly known as: BENADRYL Take 25 mg by mouth every 6 (six) hours as needed.   HYDROcodone-acetaminophen 5-325 MG tablet Commonly known as: NORCO/VICODIN Take 1-2 tablets by mouth every 6 (six) hours as needed.   metroNIDAZOLE 500 MG tablet Commonly known as: FLAGYL Take 1 tablet (500 mg total) by mouth 2 (two) times daily.   nitrofurantoin (macrocrystal-monohydrate) 100 MG capsule Commonly known as: MACROBID Take 1 capsule (100 mg total) by mouth 2 (two) times daily.   tiZANidine 4 MG tablet Commonly known as: Zanaflex Take 1-2 tablets (4-8 mg total) by mouth every 6 (six) hours as needed for muscle spasms.      -will call with culture results, if needed -f/u US in 10-14 days ordered -RX albuterol -safe meds in pregnancy list given -PCP list given -discussed  possible outcomes of pregnancy given [name] and hCG -return MAU precautions given -pt discharged to home in stable condition  Joni Reining E Jisele Price 02/06/2021, 5:03 PM

## 2021-02-06 NOTE — Discharge Instructions (Signed)
Obstetrics: Normal and Problem Pregnancies (7th ed., pp. 102-121). [place], [place]: Elsevier."> Textbook of Family Medicine (9th ed., pp. [phone]). [place]: Elsevier Saunders.">  First Trimester of Pregnancy  The first trimester of pregnancy starts on the first day of your last menstrual period until the end of week 12. This is months 1 through 3 of pregnancy. A week after a sperm fertilizes an egg, the egg will implant into the wall of the uterus and begin to develop into a baby. By the end of 12 weeks, all the baby's organs will be formed and the baby will be 2-3 inches in size. Body changes during your first trimester Your body goes through many changes during pregnancy. The changes vary and generally return to normal after your baby is born. Physical changes  You may gain or lose weight.  Your breasts may begin to grow larger and become tender. The tissue that surrounds your nipples (areola) may become darker.  Dark spots or blotches (chloasma or mask of pregnancy) may develop on your face.  You may have changes in your hair. These can include thickening or thinning of your hair or changes in texture. Health changes  You may feel nauseous, and you may vomit.  You may have heartburn.  You may develop headaches.  You may develop constipation.  Your gums may bleed and may be sensitive to brushing and flossing. Other changes  You may tire easily.  You may urinate more often.  Your menstrual periods will stop.  You may have a loss of appetite.  You may develop cravings for certain kinds of food.  You may have changes in your emotions from day to day.  You may have more vivid and strange dreams. Follow these instructions at home: Medicines  Follow your health care provider's instructions regarding medicine use. Specific medicines may be either safe or unsafe to take during pregnancy. Do not take any medicines unless told to by your health care provider.  Take a  prenatal vitamin that contains at least 600 micrograms (mcg) of folic acid. Eating and drinking  Eat a healthy diet that includes fresh fruits and vegetables, whole grains, good sources of protein such as meat, eggs, or tofu, and low-fat dairy products.  Avoid raw meat and unpasteurized juice, milk, and cheese. These carry germs that can harm you and your baby.  If you feel nauseous or you vomit: ? Eat 4 or 5 small meals a day instead of 3 large meals. ? Try eating a few soda crackers. ? Drink liquids between meals instead of during meals.  You may need to take these actions to prevent or treat constipation: ? Drink enough fluid to keep your urine pale yellow. ? Eat foods that are high in fiber, such as beans, whole grains, and fresh fruits and vegetables. ? Limit foods that are high in fat and processed sugars, such as fried or sweet foods. Activity  Exercise only as directed by your health care provider. Most people can continue their usual exercise routine during pregnancy. Try to exercise for 30 minutes at least 5 days a week.  Stop exercising if you develop pain or cramping in the lower abdomen or lower back.  Avoid exercising if it is very hot or humid or if you are at high altitude.  Avoid heavy lifting.  If you choose to, you may have sex unless your health care provider tells you not to. Relieving pain and discomfort  Wear a good support bra to relieve breast  tenderness.  Rest with your legs elevated if you have leg cramps or low back pain.  If you develop bulging veins (varicose veins) in your legs: ? Wear support hose as told by your health care provider. ? Elevate your feet for 15 minutes, 3-4 times a day. ? Limit salt in your diet. Safety  Wear your seat belt at all times when driving or riding in a car.  Talk with your health care provider if someone is verbally or physically abusive to you.  Talk with your health care provider if you are feeling sad or have  thoughts of hurting yourself. Lifestyle  Do not use hot tubs, steam rooms, or saunas.  Do not douche. Do not use tampons or scented sanitary pads.  Do not use herbal remedies, alcohol, illegal drugs, or medicines that are not approved by your health care provider. Chemicals in these products can harm your baby.  Do not use any products that contain nicotine or tobacco, such as cigarettes, e-cigarettes, and chewing tobacco. If you need help quitting, ask your health care provider.  Avoid cat litter boxes and soil used by cats. These carry germs that can cause birth defects in the baby and possibly loss of the unborn baby (fetus) by miscarriage or stillbirth. General instructions  During routine prenatal visits in the first trimester, your health care provider will do a physical exam, perform necessary tests, and ask you how things are going. Keep all follow-up visits. This is important.  Ask for help if you have counseling or nutritional needs during pregnancy. Your health care provider can offer advice or refer you to specialists for help with various needs.  Schedule a dentist appointment. At home, brush your teeth with a soft toothbrush. Floss gently.  Write down your questions. Take them to your prenatal visits. Where to find more information  American Pregnancy Association: americanpregnancy.org  [employer] of Obstetricians and Gynecologists: https://www.todd-brady.net/  Office on [employer] Health: MightyReward.co.nz Contact a health care provider if you have:  Dizziness.  A fever.  Mild pelvic cramps, pelvic pressure, or nagging pain in the abdominal area.  Nausea, vomiting, or diarrhea that lasts for 24 hours or longer.  A bad-smelling vaginal discharge.  Pain when you urinate.  Known exposure to a contagious illness, such as chickenpox, measles, Zika virus, HIV, or hepatitis. Get help right away if you have:  Spotting or bleeding from your  vagina.  Severe abdominal cramping or pain.  Shortness of breath or chest pain.  Any kind of trauma, such as from a fall or a car crash.  New or increased pain, swelling, or redness in an arm or leg. Summary  The first trimester of pregnancy starts on the first day of your last menstrual period until the end of week 12 (months 1 through 3).  Eating 4 or 5 small meals a day rather than 3 large meals may help to relieve nausea and vomiting.  Do not use any products that contain nicotine or tobacco, such as cigarettes, e-cigarettes, and chewing tobacco. If you need help quitting, ask your health care provider.  Keep all follow-up visits. This is important. This information is not intended to replace advice given to you by your health care provider. Make sure you discuss any questions you have with your health care provider. Document Revised: 03/29/2020 Document Reviewed: 02/03/2020 Elsevier Patient Education  2021 Elsevier Inc.        Miscarriage A miscarriage is the loss of pregnancy before the 20th week.  Most miscarriages happen during the first 3 months of pregnancy. Sometimes, a miscarriage can happen before a woman knows that she is pregnant. Having a miscarriage can be an emotional experience. If you have had a miscarriage, talk with your health care provider about any questions you may have about the loss of your baby, the grieving process, and your plans for future pregnancy. What are the causes? Many times, the cause of a miscarriage is not known. What increases the risk? The following factors may make a pregnant woman more likely to have a miscarriage: Certain medical conditions  Conditions that affect the hormone balance in the body, such as thyroid disease or polycystic ovary syndrome.  Diabetes.  Autoimmune disorders.  Infections.  Bleeding disorders.  Obesity. Lifestyle factors  Using products with tobacco or nicotine in them or being exposed to tobacco  smoke.  Having alcohol.  Having large amounts of caffeine.  Recreational drug use. Problems with reproductive organs or structures  Cervical insufficiency. This is when the lowest part of the uterus (cervix) opens and thins before pregnancy is at term.  Having a condition called Asherman syndrome. This syndrome causes scarring in the uterus or causes the uterus to be abnormal in structure.  Fibrous growths, called fibroids, in the uterus.  Congenital abnormalities. These problems are present at birth.  Infection of the cervix or uterus. Personal or medical history  Injury (trauma).  Having had a miscarriage before.  Being younger than age 63 or older than age 43.  Exposure to harmful substances in the environment. This may include radiation or heavy metals, such as lead.  Use of certain medicines. What are the signs or symptoms? Symptoms of this condition include:  Vaginal bleeding or spotting, with or without cramps or pain.  Pain or cramping in the abdomen or lower back.  Fluid or tissue coming out of the vagina. How is this diagnosed? This condition may be diagnosed based on:  A physical exam.  Ultrasound.  Lab tests, such as blood tests, urine tests, or swabs for infection. How is this treated? Treatment for a miscarriage is sometimes not needed if all the pregnancy tissue that was in the uterus comes out on its own, and there are no other problems such as infection or heavy bleeding. In other cases, this condition may be treated with:  Dilation and curettage (D&C). In this procedure, the cervix is stretched open and any remaining pregnancy tissue is removed from the lining of the uterus (endometrium).  Medicines. These may include: ? Antibiotic medicine, to treat infection. ? Medicine to help any remaining pregnancy tissue come out of the body. ? Medicine to reduce (contract) the size of the uterus. These medicines may be given if there is a lot of  bleeding. If you have Rh-negative blood, you may be given an injection of a medicine called Rho(D) immune globulin. This medicine helps prevent problems with future pregnancies. Follow these instructions at home: Medicines  Take over-the-counter and prescription medicines only as told by your health care provider.  If you were prescribed antibiotic medicine, take it as told by your health care provider. Do not stop taking the antibiotic even if you start to feel better. Activity  Rest as told by your health care provider. Ask your health care provider what activities are safe for you.  Have someone help with home and family responsibilities during this time. General instructions  Monitor how much tissue or blood clot material comes out of the vagina.  Do  not have sex, douche, or put anything, such as tampons, in your vagina until your health care provider says it is okay.  To help you and your partner with the grieving process, talk with your health care provider or get counseling.  When you are ready, meet with your health care provider to discuss any important steps you should take for your health. Also, discuss steps you should take to have a healthy pregnancy in the future.  Keep all follow-up visits. This is important.   Where to find more information  The [employer] of Obstetricians and Gynecologists: acog.org  U.S. Department of Health and Cytogeneticist of Women's Health: http://hoffman.com/ Contact a health care provider if:  You have a fever or chills.  There is bad-smelling fluid coming from the vagina.  You have more bleeding instead of less.  Tissue or blood clots come out of your vagina. Get help right away if:  You have severe cramps or pain in your back or abdomen.  Heavy bleeding soaks through 2 large sanitary pads an hour for more than 2 hours.  You become light-headed or weak.  You faint.  You feel sad, and your sadness takes  over your thoughts.  You think about hurting yourself. If you ever feel like you may hurt yourself or others, or have thoughts about taking your own life, get help right away. Go to your nearest emergency department or:  Call your local emergency services (911 in the U.S.).  Call a suicide crisis helpline, such as the National Suicide Prevention Lifeline at (475)091-3324. This is open 24 hours a day in the U.S.  Text the Crisis Text Line at (479)267-5146 (in the U.S.). Summary  Most miscarriages happen in the first 3 months of pregnancy. Sometimes miscarriage happens before a woman knows that she is pregnant.  Follow instructions from your health care provider about medicines and activity.  To help you and your partner with grieving, talk with your health care provider or get counseling.  Keep all follow-up visits. This information is not intended to replace advice given to you by your health care provider. Make sure you discuss any questions you have with your health care provider. Document Revised: 04/21/2020 Document Reviewed: 04/21/2020 Elsevier Patient Education  2021 Elsevier Inc.                        Safe Medications in Pregnancy    Acne: Benzoyl Peroxide Salicylic Acid  Backache/Headache: Tylenol: 2 regular strength every 4 hours OR              2 Extra strength every 6 hours  Colds/Coughs/Allergies: Benadryl (alcohol free) 25 mg every 6 hours as needed Breath right strips Claritin Cepacol throat lozenges Chloraseptic throat spray Cold-Eeze- up to three times per day Cough drops, alcohol free Flonase (by prescription only) Guaifenesin Mucinex Robitussin DM (plain only, alcohol free) Saline nasal spray/drops Sudafed (pseudoephedrine) & Actifed ** use only after [redacted] weeks gestation and if you do not have high blood pressure Tylenol Vicks Vaporub Zinc lozenges Zyrtec   Constipation: Colace Ducolax suppositories Fleet enema Glycerin  suppositories Metamucil Milk of magnesia Miralax Senokot Smooth move tea  Diarrhea: Kaopectate Imodium A-D  *NO pepto Bismol  Hemorrhoids: Anusol Anusol HC Preparation H Tucks  Indigestion: Tums Maalox Mylanta Zantac  Pepcid  Insomnia: Benadryl (alcohol free) 25mg  every 6 hours as needed Tylenol PM Unisom, no Gelcaps  Leg Cramps: Tums MagGel  Nausea/Vomiting:  Bonine Dramamine  Emetrol Ginger extract Sea bands Meclizine  Nausea medication to take during pregnancy:  Unisom (doxylamine succinate 25 mg tablets) Take one tablet daily at bedtime. If symptoms are not adequately controlled, the dose can be increased to a maximum recommended dose of two tablets daily (1/2 tablet in the morning, 1/2 tablet mid-afternoon and one at bedtime). Vitamin B6 100mg  tablets. Take one tablet twice a day (up to 200 mg per day).  Skin Rashes: Aveeno products Benadryl cream or 25mg  every 6 hours as needed Calamine Lotion 1% cortisone cream  Yeast infection: Gyne-lotrimin 7 Monistat 7   **If taking multiple medications, please check labels to avoid duplicating the same active ingredients **take medication as directed on the label ** Do not exceed 4000 mg of tylenol in 24 hours **Do not take medications that contain aspirin or ibuprofen          AREA FAMILY PRACTICE PHYSICIANS  [place] ( ) . [facility] o [address] [place]., [place], [place] o [phone] o Mon-Fri 8:30-12:30, 1:30-5:00 o Accepting Medicaid . [facility] [address], [place], [address] [place] o [phone] o Mon-Fri 8:00-5:30 . [place] [address] o [address], [place], [address] [place] o [phone], Tue, Thur, Fri 8:30-5:00, Wed 10:00-7:00 (closed 1-2pm) o Accepting Medicaid . [facility] o [address], [place], [address]  [place] o Phone -  [phone]   Fax - [phone]  [place] [phone]) . [facility] o [address], [place], [address] [place] o [phone] o Mon-Fri 8:00-5:00 . [facility] - [facility] at [facility])  o [address] [place]., [place], [place] o [phone] o Mon-Fri 8:30-5:30, Sat (Oct.-Mar.) 9:00-1:00 o Accepting [facility] [phone]) . [facility] at [facility] o [address], [place], [address] [place] o [phone] o Mon-Fri 8:00-5:00  [place] [phone]) . [facility] at [facility] o [address] [place], [address] [place] o [phone] o Mon-Fri 8:00-5:00 . [id number] at [place] o [address] [place], [place], [place] o [phone] o Mon-Fri 8:00-5:00 . [phone] at [phone] o [address] [place], [address] [place] o [phone] o Mon-Fri 8:00-5:00 . [facility] o [address] [place] [address] [place] o [phone] o Mon-Fri 7:30-5:30  [place] [phone] & [phone]) . [facility] o [address], [place], [address] [place] o [phone] o Mon-Thur 8:00-6:00 o Accepting Medicaid . [facility] o [address] [place], [address] [place] o [phone] o Mon-Thur 7:30-7:30, Fri 7:30-4:30 o Accepting Medicaid . [facility] at [facility] o [phone] N. [address] [place], [address]  [place] o [phone]   Fax - [phone]  [place]west  [phone] & [phone]) . ([phone] o [address] [place], [address] [place] o [phone] o Mon-Fri 7:00-5:00 . [facility] o [address], [place], [address] [place] o [phone] o Mon-Fri 8:00-5:00 o Accepting Medicaid . [facility] - [facility] o [address] [place], [address] [place] o [phone] o Mon-Fri 8:00-5:00 o Accepting Medicaid  [facility] [place] [phone]) . [facility] at [facility] o [address] [place], [address] [place] o [phone] o Mon-Fri 8:00-5:00 . [facility] - [facility] at  [place]) o [address], [place], [place] [address] o [phone] o Mon-Fri 8:00-5:00 o Accepting Medicaid . [facility]) o [address], [place], [place] [address] o [phone] o Mon-Fri 8:00-5:30, Sat&Sun by appointment (phones open at 8:30) o Accepting [facility]  [place] [phone] & [phone]) . [facility] o [address] [place], [place] [address] o [phone] o Mon-Thur 8:00-7:00, Fri 8:00-5:00, Sat 8:00-12:00, Sun 9:00-12:00 o Accepting Medicaid . [facility] at [facility] o [address], [place], [place] [address] o [phone] o Mon-Thur 8:00-5:00 o Accepting Medicaid . [facility] at [place] o [address] [place], [place] [address] o [phone] o Mon-Fri 8:00-5:30, Sat (Oct.-Mar.) 9:00-1:00 o Accepting [employer]  [place] [phone]) . [facility] o [address] [place], [place], [place] [address] o [phone] o Mon-Fri 8:00-5:00 o Accepting Medicaid   [facility] [phone]) . [facility] at [facility] o [address], [place], [place] [address] o [phone] o Mon-Fri 8:00-5:00 . [facility] o [address], [place], [place] [address] o [phone] o Mon-Fri 8:00-5:00 . [facility] [place] o [address], [place], [place] [address] o [phone] o Mon-Fri 8:00-5:00 o After hours clinic [facility]([address] [place], [place] [address]) [phone]  Mon-Fri 5:00-8:00, Sat 12:00-6:00, Sun 10:00-4:00 o Accepting Medicaid . [facility] o [address], [place], [place]  [address] o [phone]   Fax - [phone]  [place] [phone]) . Nature conservation [facility] at [facility] o [address], [place], [place] [address] o [phone] o Mon-Fri 8:00-5:00 . [facility] [place] [facility] [place]) o [address], [place], [place] [address] o [phone] o Mon-Thur 8:00-7:00, Fri 8:00-5:00, Sat 8:00-12:00

## 2021-02-06 NOTE — MAU Note (Signed)
Eileen Graham is a 25 y.o. here in MAU reporting: had + UPT at home. Has been having some cramping for the past week. No bleeding or discharge.  LMP: 12/28/20  Onset of complaint: ongoing  Pain score: 6/10  Vitals:   02/06/21 1235  BP: 110/60  Pulse: 87  Resp: 16  Temp: 98.5 F (36.9 C)  SpO2: 100%     Lab orders placed from triage: UPT

## 2021-02-07 LAB — GC/CHLAMYDIA PROBE AMP (~~LOC~~) NOT AT ARMC
Chlamydia: NEGATIVE
Comment: NEGATIVE
Comment: NORMAL
Neisseria Gonorrhea: NEGATIVE

## 2021-02-18 ENCOUNTER — Other Ambulatory Visit: Payer: Self-pay

## 2021-02-18 ENCOUNTER — Encounter (HOSPITAL_COMMUNITY): Payer: Self-pay

## 2021-02-18 ENCOUNTER — Emergency Department (HOSPITAL_COMMUNITY)
Admission: EM | Admit: 2021-02-18 | Discharge: 2021-02-18 | Disposition: A | Discharge status: Home / Self Care | Payer: Medicaid Other | Attending: Emergency Medicine | Admitting: Emergency Medicine

## 2021-02-18 DIAGNOSIS — O99511 Diseases of the respiratory system complicating pregnancy, first trimester: Secondary | ICD-10-CM | POA: Insufficient documentation

## 2021-02-18 DIAGNOSIS — J4521 Mild intermittent asthma with (acute) exacerbation: Secondary | ICD-10-CM

## 2021-02-18 DIAGNOSIS — Z3A00 Weeks of gestation of pregnancy not specified: Secondary | ICD-10-CM | POA: Insufficient documentation

## 2021-02-18 DIAGNOSIS — Z87891 Personal history of nicotine dependence: Secondary | ICD-10-CM | POA: Insufficient documentation

## 2021-02-18 MED ORDER — PREDNISONE 20 MG PO TABS
60.0000 mg | ORAL_TABLET | Freq: Once | ORAL | Status: AC
  Administered 2021-02-18: 60 mg via ORAL
  Filled 2021-02-18: qty 3

## 2021-02-18 MED ORDER — ALBUTEROL SULFATE HFA 108 (90 BASE) MCG/ACT IN AERS: 2.0000 | INHALATION_SPRAY | RESPIRATORY_TRACT | Status: DC | PRN

## 2021-02-18 NOTE — ED Triage Notes (Addendum)
Patient c/o asthma/expiratory wheezing x 1 hour. patient states her "albuterol inhaler is not working." patient states she has used her albuterol inhaler 30 times in the past hour.  patient states she is "about 2 months pregnant.

## 2021-02-18 NOTE — ED Provider Notes (Signed)
[facility]-EMERGENCY DEPT Provider Note   CSN: [id number] Arrival date & time: 02/18/21  1636     History Chief Complaint  Patient presents with  . Asthma    Eileen Graham is a 25 y.o. female past medical history of asthma, current first trimester pregnancy, presenting for evaluation of asthma exacerbation.  She states she was trying to unclog the drain in her bathtub and the fumes caused her to have asthma attack.  She felt extreme chest tightness and shortness of breath with wheezing.  She states she treated with albuterol however had no relief.  She took at least 20 to 30 puffs of albuterol.  She states that helped her to improve somewhat, however she does not feel at her baseline.  She does note that it caused her to have palpitations which she states are common for her when she takes albuterol.  She feels soreness and tightness in her back though no chest pain.  She does not manage her asthma with any other inhalers.  No fevers or symptoms of URI.   Had OB US on 02/06/21 with EDA of [redacted]w[redacted]d, though was unable to visualize entirety and establish fetal viability.  She has follow-up ultrasound on 02/26/2021.  The history is provided by the patient.       Past Medical History:  Diagnosis Date  . Alopecia   . Asthma   . Headache   . Infection    UTI  . Shingles   . Trichomonas infection     Patient Active Problem List   Diagnosis Date Noted  . Intrauterine pregnancy 07/19/2019  . Morning sickness 07/19/2019  . Insomnia 07/19/2019  . Bacterial vaginosis 07/19/2019    Past Surgical History:  Procedure Laterality Date  . abortion     x2     OB History    Gravida  4   Para  1   Term  1   Preterm  0   AB  2   Living  1     SAB  0   IAB  2   Ectopic  0   Multiple      Live Births  1           Family History  Problem Relation Age of Onset  . Hypertension Mother   . Healthy Father   . Asthma Maternal Grandfather   . Congestive  Heart Failure Maternal Grandfather     Social History   Tobacco Use  . Smoking status: Former Smoker    Types: Cigarettes  . Smokeless tobacco: Never Used  . Tobacco comment: "years ago"  Vaping Use  . Vaping Use: Former  . Devices: stopped with +UPT  Substance Use Topics  . Alcohol use: Not Currently  . Drug use: Not Currently    Types: Marijuana    Comment: August 2020    Home Medications Prior to Admission medications   Medication Sig Start Date End Date Taking? Authorizing Provider  albuterol (VENTOLIN HFA) 108 (90 Base) MCG/ACT inhaler Inhale 2 puffs into the lungs every 4 (four) hours as needed for wheezing or shortness of breath. 01/17/21   Mickie Bail, NP  albuterol (VENTOLIN HFA) 108 (90 Base) MCG/ACT inhaler Inhale 2 puffs into the lungs every 6 (six) hours as needed for wheezing or shortness of breath. 02/06/21   Nugent, Odie Sera, NP  diphenhydrAMINE (BENADRYL) 25 MG tablet Take 25 mg by mouth every 6 (six) hours as needed.    [provider]  HYDROcodone-acetaminophen (NORCO/VICODIN) 5-325 MG tablet Take 1-2 tablets by mouth every 6 (six) hours as needed. 06/12/20   Eustace Moore, MD  metroNIDAZOLE (FLAGYL) 500 MG tablet Take 1 tablet (500 mg total) by mouth 2 (two) times daily. 01/17/21   Mickie Bail, NP  nitrofurantoin, macrocrystal-monohydrate, (MACROBID) 100 MG capsule Take 1 capsule (100 mg total) by mouth 2 (two) times daily. 01/19/21   Lamptey, Britta Mccreedy, MD  tiZANidine (ZANAFLEX) 4 MG tablet Take 1-2 tablets (4-8 mg total) by mouth every 6 (six) hours as needed for muscle spasms. 06/12/20   Eustace Moore, MD  famotidine (PEPCID) 40 MG tablet Take 1 tablet (40 mg total) by mouth every evening. 08/09/19 06/12/20  Nugent, Odie Sera, NP  glycopyrrolate (ROBINUL) 2 MG tablet Take 1 tablet (2 mg total) by mouth 3 (three) times daily as needed. 08/07/19 06/12/20  Leftwich-Kirby, Wilmer Floor, CNM  loratadine (CLARITIN) 10 MG tablet Take 1 tablet (10 mg total) by mouth  daily. 02/19/19 06/12/20  Wieters, Hallie C, PA-C  metoCLOPramide (REGLAN) 10 MG tablet Take 1 tablet (10 mg total) by mouth 3 (three) times daily with meals. 08/09/19 06/12/20  Nugent, Odie Sera, NP  montelukast (SINGULAIR) 10 MG tablet Take 1 tablet (10 mg total) by mouth at bedtime. Patient not taking: Reported on 07/14/2019 02/19/19 06/12/20  Wieters, Hallie C, PA-C  potassium chloride (KLOR-CON) 10 MEQ tablet Take 1 tablet (10 mEq total) by mouth daily. 08/09/19 06/12/20  Nugent, Odie Sera, NP    Allergies    Patient has no known allergies.  Review of Systems   Review of Systems  All other systems reviewed and are negative.   Physical Exam Updated Vital Signs BP 134/78 (BP Location: Right Arm)   Pulse (!) 108   Temp 98 F (36.7 C) (Oral)   Resp 18   Ht 5\' 3"  (1.6 m)   Wt 68 kg   LMP 12/28/2020   SpO2 95%   BMI 26.57 kg/m   Physical Exam Vitals and nursing note reviewed.  Constitutional:      General: She is not in acute distress.    Appearance: She is well-developed. She is diaphoretic. She is not ill-appearing.  HENT:     Head: Normocephalic and atraumatic.  Eyes:     Conjunctiva/sclera: Conjunctivae normal.  Cardiovascular:     Rate and Rhythm: Regular rhythm. Tachycardia present.  Pulmonary:     Effort: Pulmonary effort is normal.     Breath sounds: Wheezing present.     Comments: Diminished bilaterally with faint expiratory wheezes. Speaking in full sentences, normal effort Abdominal:     Palpations: Abdomen is soft.  Skin:    General: Skin is warm.  Neurological:     Mental Status: She is alert.  Psychiatric:        Behavior: Behavior normal.     ED Results / Procedures / Treatments   Labs (all labs ordered are listed, but only abnormal results are displayed) Labs Reviewed - No data to display  EKG None  Radiology No results found.  Procedures Procedures   Medications Ordered in ED Medications  predniSONE (DELTASONE) tablet 60 mg (60 mg Oral Given  02/18/21 1744)    ED Course  I have reviewed the triage vital signs and the nursing notes.  Pertinent labs & imaging results that were available during my care of the patient were reviewed by me and considered in my medical decision making (see chart for details).  Clinical  Course as of 02/18/21 1830  Sun Feb 18, 2021  1821 Patient eloped prior to reevaluation. [JR]    Clinical Course User Index [JR] Kennedie Pardoe, [place] N, PA-C   MDM Rules/Calculators/A&P                          Patient presenting for asthma attack that began after she inhaled some fumes from a drain cleaner.  She self treated at home with 20 to 30 puffs of her albuterol inhaler and arrives diaphoretic and tachycardic.  She however has normal work of breathing, lung sounds are diminished bilaterally with faint expiratory wheezes.  O2 saturation is excellent on room air, 99%.  Heart rate gradually improved throughout ED monitoring, she was treated with prednisone.  However prior to reevaluation patient eloped from the ED.  RN reporting heart rate down to 108 prior to elopement. She was reportedly visualized leaving the ED with steady gait and no distress.   Discussed results, findings, treatment and follow up. Patient advised of return precautions. Patient verbalized understanding and agreed with plan.  Final Clinical Impression(s) / ED Diagnoses Final diagnoses:  Exacerbation of intermittent asthma, unspecified asthma severity    Rx / DC Orders ED Discharge Orders    None       Chelbie Jarnagin, [place] N, PA-C 02/18/21 1830    Vanetta Mulders, MD 02/21/21 [phone]

## 2021-02-26 ENCOUNTER — Other Ambulatory Visit: Payer: Self-pay

## 2021-02-26 ENCOUNTER — Ambulatory Visit
Admission: RE | Admit: 2021-02-26 | Discharge: 2021-02-26 | Disposition: A | Discharge status: Home / Self Care | Payer: Medicaid Other | Source: Ambulatory Visit | Attending: Women's Health | Admitting: Women's Health

## 2021-02-26 ENCOUNTER — Telehealth: Payer: Self-pay | Admitting: Obstetrics and Gynecology

## 2021-02-26 DIAGNOSIS — O3680X0 Pregnancy with inconclusive fetal viability, not applicable or unspecified: Secondary | ICD-10-CM | POA: Insufficient documentation

## 2021-02-26 NOTE — Telephone Encounter (Signed)
I called Eileen Graham today at 3:27 PM and confirmed patient's identity using two patient identifiers. [name] results from earlier today were reviewed. Patient is scheduled for new OB visit.  First trimester warning signs reviewed. Patient voiced understanding and had no further questions.   US OB Transvaginal  Result Date: 02/26/2021 CLINICAL DATA:  Pregnancy of unknown location. EXAM: TRANSVAGINAL OB ULTRASOUND TECHNIQUE: Transvaginal ultrasound was performed for complete evaluation of the gestation as well as the maternal uterus, adnexal regions, and pelvic cul-de-sac. COMPARISON:  Pelvic ultrasound 02/06/2021. FINDINGS: Intrauterine gestational sac: Single. Yolk sac:  Visualized. Embryo:  Visualized. Cardiac Activity: Detected. Heart Rate: 166 bpm CRL:   19.6 mm   8 w 4 d                  [name] EDC: 10/04/2021. Subchorionic hemorrhage:  None visualized. Maternal uterus/adnexae: Corpus luteum cyst on the left noted. Trace amount of free pelvic fluid is seen. IMPRESSION: Single living intrauterine pregnancy.  No acute finding. Electronically Signed   By: Drusilla Kanner M.D.   On: 02/26/2021 10:56    Avin Upperman, Harolyn Rutherford, NP 02/26/2021 3:27 PM

## 2021-03-06 ENCOUNTER — Telehealth (INDEPENDENT_AMBULATORY_CARE_PROVIDER_SITE_OTHER): Payer: Medicaid Other

## 2021-03-06 DIAGNOSIS — Z136 Encounter for screening for cardiovascular disorders: Secondary | ICD-10-CM

## 2021-03-06 DIAGNOSIS — O0990 Supervision of high risk pregnancy, unspecified, unspecified trimester: Secondary | ICD-10-CM | POA: Insufficient documentation

## 2021-03-06 DIAGNOSIS — Z3480 Encounter for supervision of other normal pregnancy, unspecified trimester: Secondary | ICD-10-CM

## 2021-03-06 HISTORY — DX: Supervision of high risk pregnancy, unspecified, unspecified trimester: O09.90

## 2021-03-06 MED ORDER — BLOOD PRESSURE MONITORING DEVI: 1.0000 | 0 refills | Status: DC

## 2021-03-06 NOTE — Progress Notes (Signed)
New OB Intake  I connected with  Eileen Graham on 03/06/21 at  2:15 PM EDT by MyChart and verified that I am speaking with the correct person using two identifiers. Nurse is located at [facility] and pt is located at [employer] [facility].  I discussed the limitations, risks, security and privacy concerns of performing an evaluation and management service by telephone and the availability of in person appointments. I also discussed with the patient that there may be a patient responsible charge related to this service. The patient expressed understanding and agreed to proceed.  I explained I am completing New OB Intake today. We discussed her EDD of 10/04/21 that is based on LMP of 12/28/20. Pt is G 4/P 1. I reviewed her allergies, medications, Medical/Surgical/OB history, and appropriate screenings. I informed her of [facility] services. Based on history, this is a/an uncomplicated pregnancy.  Patient Active Problem List   Diagnosis Date Noted  . Supervision of other normal pregnancy, antepartum 03/06/2021  . Intrauterine pregnancy 07/19/2019  . Morning sickness 07/19/2019  . Insomnia 07/19/2019  . Bacterial vaginosis 07/19/2019    Concerns addressed today  Delivery Plans:  Plans to deliver at [facility].   MyChart/Babyscripts MyChart access verified. I explained pt will have some visits in office and some virtually. Babyscripts instructions given. Account successfully created and app downloaded.  Blood Pressure Cuff Blood pressure cuff ordered for patient to pick-up from [employer]. Explained after first prenatal appt pt will check weekly and document in Babyscripts.  Anatomy US Explained first scheduled [name] will be around 19 weeks. Anatomy US scheduled for 05/14/21 at 8:45a. Pt notified to arrive at 8:30a.  Labs Discussed Avelina Laine genetic screening with patient. Would like both Panorama and Horizon drawn at new OB visit. Routine prenatal labs needed.  Covid Vaccine Patient has not covid vaccine.   [facility]  Referral Patient is interested in referral to [facility].   Pregnancy Navigator Informed patient a pregnancy navigator will see her at her first ob visit with provider.   [name] appointments Childcare: Informed patient when she has Ultrasound appointments she will not be able to bring child/ children with her to Ultrasound appointments. Asked if childcare would be an issue.   First visit review I reviewed new OB appt with pt. I explained she will have a pelvic exam, ob bloodwork with genetic screening, and PAP smear. Explained pt will be seen by Dr. Crissie Reese at first visit; encounter routed to appropriate provider. If new patient offered monthly Zoom meeting  Henrietta Dine, [facility] 03/06/2021  2:27 PM

## 2021-03-06 NOTE — Addendum Note (Signed)
Addended by: Henrietta Dine on: 03/06/2021 04:30 PM   Modules accepted: Orders

## 2021-03-06 NOTE — Patient Instructions (Signed)
AREA PEDIATRIC/FAMILY PRACTICE PHYSICIANS  [place] ([address]) . [facility] o Chambliss, MD; Eniola, MD; Hale, MD; Hensel, MD; McDiarmid, MD; McIntyer, MD; Khole Branch, MD; Walden, MD o [address] [place], [place] [address] o [phone] o Mon-Fri 8:30-12:30, 1:30-5:00 o Providers come to see babies at [facility] o Accepting Medicaid . [facility] o Limited providers who accept newborns: Koirala, MD; Morrow, MD; Wolters, MD o [address], [place], [place] [address] o [phone] o Mon-Fri 8:00-5:30 o Babies seen by providers at [facility] o Does NOT accept Medicaid o Please call early in hospitalization for appointment (limited availability)  . [facility] o Mulberry, MD o [address] [place], [place] [address] o [phone] o Mon, Tue, Thur, Fri 8:30-5:00, Wed 10:00-7:00 (closed 1-2pm) o Babies seen by [facility] providers o Accepting Medicaid . [facility] o Rubin, MD o [address], [place], [place] [address] o [phone] o Mon-Fri 8:30-5:00, Sat 8:30-12:00 o Provider comes to see babies at [facility] o Accepting Medicaid o Must have been referred from current patients or contacted office prior to delivery . [facility] ([facility]) o Brown, MD; Chandler, MD; Ettefagh, MD; Grant, MD; Lester, MD; McCormick, MD; McQueen, MD; Prose, MD; Simha, MD; Stanley, MD; Stryffeler, NP; Tebben, NP o [address], [place], [place] [address] o [phone] o Mon, Tue, Thur, Fri 8:30-5:30, Wed 9:30-5:30, Sat 8:30-12:30 o Babies seen by [facility] providers o Accepting Medicaid o Only accepting infants of first-time parents or siblings of current patients o Hospital discharge coordinator will make follow-up appointment . Jack Amos o [address],  [place], [place]  [address] o [phone]   Fax - [phone] . [facility] o [address], [place], [place]  [address] o Phone - [phone]   Fax - [phone] . Shilpa Gosrani o [address], [place], [place]  [address] o [phone]  [place] ([address]) . [facility] o Bates, MD; [place], MD; Cooper, Cox, MD; MD; Davis, MD; Dovico, MD; Ettefaugh, MD; Little, MD; Lowe, MD; Keiffer, MD; Melvin, MD; Sumner, MD; Williams, MD o [address], [place], [place] [address] o [phone] o Mon-Fri 8:30-5:00 (extended evenings Mon-Thur as needed), Sat-Sun 10:00-1:00 o Providers come to see babies at [facility] o Accepting Medicaid for families of first-time babies and families with all children in the household age 3 and under. Must register with office prior to making appointment (M-F only). . [facility] o Henson, NP; Knapp, MD; Lalonde, MD; Tysinger, PA o [address] [place], [place] [address] o [phone] o Mon-Fri 8:00-5:00 o Babies seen by providers at [facility] o Does NOT accept Medicaid/Commercial Insurance Only . [facility] ([facility])  o Artis, MD; Barnes, MD; Bratton, MD; Coccaro, MD; Lockett Gardner, MD; Kramer, MD; Marshall, MD; Netherton, MD; Poleto, MD; Skinner, MD o [address] [place],  [address] o [phone] o Mon-Fri 8:30-5:30, Sat (Oct.-Mar.) 9:00-1:00 o Babies seen by providers at [facility] o Accepting Medicaid  [place] ([address]) . [facility] o Reid, MD; Warner, MD o [address], [place],  [address] o [phone] o Mon-Fri 8:30-5:00, Sat 8:30-12:00 o Providers come to see babies at [facility] o Does NOT accept Medicaid . [facility] o Becker, PA; Hagler, MD; Scifres, PA; Sun, MD; Swayne, MD o [address],  [place], [place] [address] o [phone] o Mon-Fri 8:00-5:00 o Babies seen by providers at [facility] o Does NOT accept Medicaid o Only accepting babies of parents who are patients o Please call early in hospitalization for appointment (limited availability) . [facility] o Nale, MD; Frye, MD; Kelleher, MD; Mack, NP; Miller, MD; O'Keller, MD; Patterson, NP; Pudlo, MD; Puzio, MD; Thomas, MD; Tucker, MD; Twiselton, MD o [address], [place], [place] [address] o [phone] o Mon-Fri 8:00-5:00, Sat 9:00-12:00 o Providers come to see babies at [facility] o Does NOT accept Medicaid  [place] ([address]) . [facility] o Limited providers accepting new patients: Brake, NP; Wharton, PA o [address], [place], [place] [address] o [phone] o Mon-Fri 8:00-5:00 o Babies seen by providers at [facility] o Does NOT accept Medicaid o Only accepting babies of parents who are patients o Please call early in hospitalization for appointment (limited availability) . [facility] o Gay, MD; Quinlan, MD o [address] [place], [place] [address] o [phone] (press [phone] to schedule appointment) o Mon-Fri 8:00-5:00 o Providers come to see babies at [facility] o Does NOT accept Medicaid . [facility] o Mazer, MD o [address] [place], [place] [address] o [phone] o Mon-Fri 8:30-5:00 (lunch 12:30-1:00), extended hours by appointment only Wed 5:00-6:30 o Babies seen by [facility] providers o Accepting Medicaid . [facility] o Banks, MD; Jordan, MD; Koberlein, MD o [address], [place], [place] [address] o [phone] o Mon-Fri 8:00-5:00 o Babies seen by [facility] providers o Does NOT accept Medicaid . [facility] o Parker, MD; Hunter, MD; Wallace, DO o [address] [place], [place]  [address] o [phone] o Mon-Fri 8:00-5:00 o Babies seen by [facility] providers o Does NOT accept Medicaid . [facility] o Brandon, PA; Brecken, PA; Christy, NP; Dees, MD; DeClaire, MD; DeWeese, MD; Hansen, NP; Mills, NP; Parrish, NP; Smoot, NP; Summer, MD; Vapne, MD o [address] [place], [place] [address] o [phone] o Mon-Fri 8:30-5:00, Sat 10:00-1:00 o Providers come to see babies at [facility] o Does NOT accept Medicaid o Free prenatal information session Tuesdays at 4:45pm . [facility] o Bouska, MD; Gordon, PA; Jeffery, PA; Weber, PA o [address] [place] [address] o [phone] o Mon-Fri 7:30-5:30 o Babies seen by [facility] providers . [facility] o [address], [place], [place]  [address] o [phone]   Fax - [phone]  [place] ([address]) . [facility] o [place]ese, [place] o [address] [place], [place] [address] o [phone] o Mon-Thur 8:00-6:00 o Providers come to see babies at [facility] o Accepting Medicaid . [facility] o Anderson, NP; Badger, MD; Beal, PA; Spencer, PA o [address] [place], [place] [address] o [phone] o Mon-Thur 7:30-7:30, Fri 7:30-4:30 o Babies seen by [facility] providers o Accepting Medicaid . [facility] o Agbuya, MD; Klett, NP; Romgoolam, MD o [address], [place], [place] [address] o [phone] o Mon-Fri 8:30-5:00, Sat 8:30-12:00 o Providers come to see babies at [facility] o Accepting Medicaid o Must have "Meet & Greet" appointment at office prior to delivery . [facility] o McCord,   MD; Wallace, MD; Wood, MD o [address], [place], [place] [address] o [phone] o Mon-Wed 8:00-6:00, Thur-Fri 8:00-5:00, Sat 9:00-12:00 o Providers come to  see babies at [facility] o Does NOT accept Medicaid o Only accepting siblings of current patients . [facility]  o [address], [place], [place]  [address] o [phone]   Fax - [phone] . [facility] o [address], [place], [place]  [address] o [phone]   Fax - [phone]  [place] ([address] & [address]) . [facility] o Cirigliano, DO; Matthews, DO o [address] [place], [place] [address] o [phone] o Mon-Fri 7:00-5:00 o Babies seen by [facility] providers o Does NOT accept Medicaid . [facility] o Briscoe, MD; Howley, PA; Moreira, PA o [address], [place], [place] [address] o [phone] o Mon-Fri 8:00-5:00 o Babies seen by [facility] providers o Accepting Medicaid . [facility] o Boyd, MD; Church, PA; Jones, NP; Osborn, PA o [address], [place], [place] [address] o [phone] o Mon-Fri 8:00-5:00 o Babies seen by providers at [facility] o Accepting Medicaid  [place] ([address]) . [facility] [place] o Wendling, DO o [address] [place], [place] [address] o [phone] o Mon-Fri 8:00-5:00 o Babies seen by [facility] providers o Does NOT accept Medicaid o Limited availability, please call early in hospitalization to schedule follow-up . [facility] o Calderon, PA; Cummings, MD; Dillard, MD; Martin, PA; Olson, MD; VanDeven, PA o [address], [place], [place] [address] o [phone] o Mon-Fri 8:30-5:00, Sat 9:00-12:00 o Babies seen by providers at [facility] o Accepting Medicaid o Please register online then schedule online or call office o [email or web address] . [facility] ([facility] o Hunter, NP; Kumar, MD; Martin Rogers, PA o [address], [place], [place] [address] o [phone] o Mon-Fri 8:00-5:00 o Babies seen by providers at [facility] o Accepting Medicaid . [facility] ([facility]) o [place], MD; Kristi Fleenor, NP; West, MD o [address], [place], [place] [address] o [phone] o Mon-Fri 8:00-5:30, Sat&Sun by appointment (phones open at 8:30) o Babies seen by [facility] providers o Accepting Medicaid o Must be a first-time baby or sibling of current patient . [facility] [place]  o [address], [place], [place]  [address] o [phone]   Fax - [phone]  [place] ([address]) . [facility] o Brown, PA; Cowen, PA; Rice, MD; Helton, PA; Spry, MD o [address] [place], [place] [address] o [phone] o Mon-Thur 8:00-7:00, Fri 8:00-5:00, Sat 8:00-12:00, Sun 9:00-12:00 o Babies seen by [facility] providers o Accepting Medicaid . [facility] o Coe-Goins, MD; Marshall, MD; Pierre-Louis, MD o [address], [place], [place] [address] o [phone] o Mon-Thur 8:00-5:00 o Babies seen by providers at [facility] o Accepting Medicaid . [facility] o Bratton, MD; Coe-Goins, MD; Hayes, MD; Lewis, MD; List, MD; Lott, MD; Marshall, MD; Moran, MD; O'Marylou Wages, MD; Pierre-Louis, MD; Pitonzo, MD; Scholer, MD; Spangle, MD o [address] [place], [place]   [address] o [phone] o Mon-Fri 8:00-5:30, Sat (Oct.-Mar.) 9:00-1:00 o Babies seen by providers at [facility] o Accepting Medicaid o Must fill out new patient packet, available online at [email or web address] . [facility] ([facility]) o Friddle, NP; Harris, NP; Kelly, NP; Logan, MD;  Melvin, PA; Poth, MD; Ramadoss, MD; [place], NP o [address], [place], [place] [address] o [phone] o Mon-Thur 8:00-5:30, Fri 8:00-5:00 o Babies seen by providers at [facility] o Accepting Medicaid  [place] ([address]) . [facility] o Dixon, PA; [place], MD; Pickard, MD; Tapia, PA o [address], [place], [place] [address] o [phone] o Mon-Fri 8:00-5:00 o Babies seen by providers at [facility] o Accepting Medicaid   [place] ([address]) . [facility] [place] o Masneri, DO; Meyers, MD; Nelson, PA o [address], [place], [place] [address] o [phone] o Mon-Fri 8:00-5:00 o Babies seen by providers at [facility] o Does NOT accept Medicaid o Limited appointment availability, please call early in hospitalization  . [facility] [place] o Kunedd, DO; McGowen, MD o [address], [place], [place] [address] o [phone] o Mon-Fri 8:00-5:00 o Babies seen by [facility] providers o Does NOT accept Medicaid . [facility] [place] o Cameron, MD; MacDonald, MD; Michaels, PA; Nayak, MD o [address] [place] [address], [place], [place] [address] o [phone] o Mon-Fri 8:00-5:00 o After hours clinic ([address] [place], [place] [address]) [phone] Mon-Fri 5:00-8:00, Sat 12:00-6:00, Sun 10:00-4:00 o Babies seen by [facility] providers o Accepting Medicaid . [facility] [place] o [address], [place], [place]  [address] o [phone]   Fax - [phone]  [place] ([address]) . [facility] [place] [facility] o Andy, MD o [address], [place], [place] [address] o [phone] o Mon-Fri 8:00-5:00 o Babies seen by [facility] providers o Does NOT accept Medicaid . [facility] [place] ([facility] [place]) o Eksir, MD o [address], [place], [place]  [address] o [phone] o Mon-Thur 8:00-7:00, Fri 8:00-5:00, Sat 8:00-12:00 o Babies seen by providers at [facility] o Accepting Medicaid - but does not have vaccinations in office (must be received elsewhere) o Limited availability, please call early in hospitalization  [place] ([address]) . [facility]  o Charlene Flemming, MD o [address], [place] [address] o [phone]  Fax [phone]   

## 2021-03-06 NOTE — Progress Notes (Signed)
Pt states has been having a lot of constipation, went over some OTC options until her visits, such as Metamucil. Miralax..ect.Marland Kitchen

## 2021-03-21 ENCOUNTER — Ambulatory Visit (INDEPENDENT_AMBULATORY_CARE_PROVIDER_SITE_OTHER): Payer: Medicaid Other | Admitting: Family Medicine

## 2021-03-21 ENCOUNTER — Encounter: Payer: Self-pay | Admitting: Family Medicine

## 2021-03-21 ENCOUNTER — Other Ambulatory Visit (HOSPITAL_COMMUNITY)
Admission: RE | Admit: 2021-03-21 | Discharge: 2021-03-21 | Disposition: A | Discharge status: Home / Self Care | Payer: Medicaid Other | Source: Ambulatory Visit | Attending: Family Medicine | Admitting: Family Medicine

## 2021-03-21 ENCOUNTER — Other Ambulatory Visit: Payer: Self-pay

## 2021-03-21 VITALS — BP 112/78 | HR 103 | Wt 144.2 lb

## 2021-03-21 DIAGNOSIS — Z124 Encounter for screening for malignant neoplasm of cervix: Secondary | ICD-10-CM | POA: Diagnosis not present

## 2021-03-21 DIAGNOSIS — K5900 Constipation, unspecified: Secondary | ICD-10-CM

## 2021-03-21 DIAGNOSIS — Z3480 Encounter for supervision of other normal pregnancy, unspecified trimester: Secondary | ICD-10-CM

## 2021-03-21 DIAGNOSIS — J4520 Mild intermittent asthma, uncomplicated: Secondary | ICD-10-CM

## 2021-03-21 DIAGNOSIS — J45909 Unspecified asthma, uncomplicated: Secondary | ICD-10-CM | POA: Insufficient documentation

## 2021-03-21 MED ORDER — POLYETHYLENE GLYCOL 3350 17 GM/SCOOP PO POWD: 17.0000 g | Freq: Every day | ORAL | 1 refills | Status: DC | PRN

## 2021-03-21 MED ORDER — MONTELUKAST SODIUM 10 MG PO TABS: 10.0000 mg | ORAL_TABLET | Freq: Every day | ORAL | 11 refills | Status: DC

## 2021-03-21 NOTE — Progress Notes (Signed)
Subjective:   Eileen Graham is a 25 y.o. [id number] at [redacted]w[redacted]d by LMP, early ultrasound being seen today for her first obstetrical visit.  Her obstetrical history is significant for asthma. Patient does not intend to breast feed. Pregnancy history fully reviewed.  Patient reports nausea.  HISTORY: OB History  Gravida Para Term Preterm AB Living  4 1 1  0 2 1  SAB IAB Ectopic Multiple Live Births  0 2 0 0 1    # Outcome Date GA Lbr Len/2nd Weight Sex Delivery Anes PTL Lv  4 Current           3 Term 03/17/13    M Vag-Spont   LIV  2 IAB           1 IAB             Last pap smear was: does not think she's ever had one   Past Medical History:  Diagnosis Date  . Alopecia   . Asthma   . Headache   . Infection    UTI  . Shingles   . Trichomonas infection    Past Surgical History:  Procedure Laterality Date  . abortion     x2  . NO PAST SURGERIES     Family History  Problem Relation Age of Onset  . Hypertension Mother   . Healthy Father   . Asthma Maternal Grandfather   . Congestive Heart Failure Maternal Grandfather    Social History   Tobacco Use  . Smoking status: Former Smoker    Types: Cigarettes  . Smokeless tobacco: Never Used  . Tobacco comment: "years ago"  Vaping Use  . Vaping Use: Former  . Devices: stopped with +UPT  Substance Use Topics  . Alcohol use: Not Currently  . Drug use: Not Currently    Types: Marijuana    Comment: August 2020   No Known Allergies Current Outpatient Medications on File Prior to Visit  Medication Sig Dispense Refill  . albuterol (VENTOLIN HFA) 108 (90 Base) MCG/ACT inhaler Inhale 2 puffs into the lungs every 6 (six) hours as needed for wheezing or shortness of breath. 8 g 2  . Blood Pressure Monitoring DEVI 1 each by Does not apply route once a week. 1 each 0  . Prenatal Vit-Fe Fumarate-FA (PRENATAL MULTIVITAMIN) TABS tablet Take 1 tablet by mouth daily at 12 noon.    . [DISCONTINUED] famotidine (PEPCID) 40 MG tablet  Take 1 tablet (40 mg total) by mouth every evening. 30 tablet 1  . [DISCONTINUED] glycopyrrolate (ROBINUL) 2 MG tablet Take 1 tablet (2 mg total) by mouth 3 (three) times daily as needed. 30 tablet 3  . [DISCONTINUED] loratadine (CLARITIN) 10 MG tablet Take 1 tablet (10 mg total) by mouth daily. 30 tablet 0  . [DISCONTINUED] metoCLOPramide (REGLAN) 10 MG tablet Take 1 tablet (10 mg total) by mouth 3 (three) times daily with meals. 90 tablet 0  . [DISCONTINUED] montelukast (SINGULAIR) 10 MG tablet Take 1 tablet (10 mg total) by mouth at bedtime. (Patient not taking: Reported on 07/14/2019) 30 tablet 0  . [DISCONTINUED] potassium chloride (KLOR-CON) 10 MEQ tablet Take 1 tablet (10 mEq total) by mouth daily. 5 tablet 0   No current facility-administered medications on file prior to visit.     Exam   Vitals:   03/21/21 1009  BP: 112/78  Pulse: (!) 103  Weight: 144 lb 3.2 oz (65.4 kg)      Uterus:  Pelvic Exam: Perineum: no hemorrhoids, normal perineum   Vulva: normal external genitalia, no lesions   Vagina:  normal mucosa, normal discharge   Cervix: no lesions and normal, pap smear done.   System: General: well-developed, well-nourished female in no acute distress   Skin: normal coloration and turgor, no rashes   Neurologic: oriented, normal, negative, normal mood   Extremities: normal strength, tone, and muscle mass, ROM of all joints is normal   HEENT PERRLA, extraocular movement intact and sclera clear, anicteric   Neck supple and no masses   Respiratory:  no respiratory distress     Assessment:   Pregnancy: [id number] Patient Active Problem List   Diagnosis Date Noted  . Supervision of other normal pregnancy, antepartum 03/06/2021  . Intrauterine pregnancy 07/19/2019  . Morning sickness 07/19/2019  . Insomnia 07/19/2019  . Bacterial vaginosis 07/19/2019     Plan:  1. Supervision of other normal pregnancy, antepartum Initial labs drawn. Pap collected Miralax sent for  constipation Continue prenatal vitamins. Genetic Screening discussed, NIPS: ordered. Ultrasound discussed; fetal anatomic survey: ordered. Problem list reviewed and updated. The nature of [place] [facility] with multiple MDs and other Advanced Practice Providers was explained to patient; also emphasized that residents, students are part of our team.  2. Asthma Mild intermittent, large allergic component, add singulair  Routine obstetric precautions reviewed. Return in 4 weeks (on 04/18/2021) for [facility], ob visit.

## 2021-03-21 NOTE — Patient Instructions (Signed)
 Second Trimester of Pregnancy  The second trimester of pregnancy is from week 13 through week 27. This is months 4 through 6 of pregnancy. The second trimester is often a time when you feel your best. Your body has adjusted to being pregnant, and you begin to feel better physically. During the second trimester:  Morning sickness has lessened or stopped completely.  You may have more energy.  You may have an increase in appetite. The second trimester is also a time when the unborn baby (fetus) is growing rapidly. At the end of the sixth month, the fetus may be up to 12 inches long and weigh about 1 pounds. You will likely begin to feel the baby move (quickening) between 16 and 20 weeks of pregnancy. Body changes during your second trimester Your body continues to go through many changes during your second trimester. The changes vary and generally return to normal after the baby is born. Physical changes  Your weight will continue to increase. You will notice your lower abdomen bulging out.  You may begin to get stretch marks on your hips, abdomen, and breasts.  Your breasts will continue to grow and to become tender.  Dark spots or blotches (chloasma or mask of pregnancy) may develop on your face.  A dark line from your belly button to the pubic area (linea nigra) may appear.  You may have changes in your hair. These can include thickening of your hair, rapid growth, and changes in texture. Some people also have hair loss during or after pregnancy, or hair that feels dry or thin. Health changes  You may develop headaches.  You may have heartburn.  You may develop constipation.  You may develop hemorrhoids or swollen, bulging veins (varicose veins).  Your gums may bleed and may be sensitive to brushing and flossing.  You may urinate more often because the fetus is pressing on your bladder.  You may have back pain. This is caused by: ? Weight gain. ? Pregnancy hormones  that are relaxing the joints in your pelvis. ? A shift in weight and the muscles that support your balance. Follow these instructions at home: Medicines  Follow your health care provider's instructions regarding medicine use. Specific medicines may be either safe or unsafe to take during pregnancy. Do not take any medicines unless approved by your health care provider.  Take a prenatal vitamin that contains at least 600 micrograms (mcg) of folic acid. Eating and drinking  Eat a healthy diet that includes fresh fruits and vegetables, whole grains, good sources of protein such as meat, eggs, or tofu, and low-fat dairy products.  Avoid raw meat and unpasteurized juice, milk, and cheese. These carry germs that can harm you and your baby.  You may need to take these actions to prevent or treat constipation: ? Drink enough fluid to keep your urine pale yellow. ? Eat foods that are high in fiber, such as beans, whole grains, and fresh fruits and vegetables. ? Limit foods that are high in fat and processed sugars, such as fried or sweet foods. Activity  Exercise only as directed by your health care provider. Most people can continue their usual exercise routine during pregnancy. Try to exercise for 30 minutes at least 5 days a week. Stop exercising if you develop contractions in your uterus.  Stop exercising if you develop pain or cramping in the lower abdomen or lower back.  Avoid exercising if it is very hot or humid or if you are   at a high altitude.  Avoid heavy lifting.  If you choose to, you may have sex unless your health care provider tells you not to. Relieving pain and discomfort  Wear a supportive bra to prevent discomfort from breast tenderness.  Take warm sitz baths to soothe any pain or discomfort caused by hemorrhoids. Use hemorrhoid cream if your health care provider approves.  Rest with your legs raised (elevated) if you have leg cramps or low back pain.  If you develop  varicose veins: ? Wear support hose as told by your health care provider. ? Elevate your feet for 15 minutes, 3-4 times a day. ? Limit salt in your diet. Safety  Wear your seat belt at all times when driving or riding in a car.  Talk with your health care provider if someone is verbally or physically abusive to you. Lifestyle  Do not use hot tubs, steam rooms, or saunas.  Do not douche. Do not use tampons or scented sanitary pads.  Avoid cat litter boxes and soil used by cats. These carry germs that can cause birth defects in the baby and possibly loss of the fetus by miscarriage or stillbirth.  Do not use herbal remedies, alcohol, illegal drugs, or medicines that are not approved by your health care provider. Chemicals in these products can harm your baby.  Do not use any products that contain nicotine or tobacco, such as cigarettes, e-cigarettes, and chewing tobacco. If you need help quitting, ask your health care provider. General instructions  During a routine prenatal visit, your health care provider will do a physical exam and other tests. He or she will also discuss your overall health. Keep all follow-up visits. This is important.  Ask your health care provider for a referral to a local prenatal education class.  Ask for help if you have counseling or nutritional needs during pregnancy. Your health care provider can offer advice or refer you to specialists for help with various needs. Where to find more information  American Pregnancy Association: americanpregnancy.org  American College of Obstetricians and Gynecologists: acog.org/en/Womens%20Health/Pregnancy  Office on Women's Health: womenshealth.gov/pregnancy Contact a health care provider if you have:  A headache that does not go away when you take medicine.  Vision changes or you see spots in front of your eyes.  Mild pelvic cramps, pelvic pressure, or nagging pain in the abdominal area.  Persistent nausea,  vomiting, or diarrhea.  A bad-smelling vaginal discharge or foul-smelling urine.  Pain when you urinate.  Sudden or extreme swelling of your face, hands, ankles, feet, or legs.  A fever. Get help right away if you:  Have fluid leaking from your vagina.  Have spotting or bleeding from your vagina.  Have severe abdominal cramping or pain.  Have difficulty breathing.  Have chest pain.  Have fainting spells.  Have not felt your baby move for the time period told by your health care provider.  Have new or increased pain, swelling, or redness in an arm or leg. Summary  The second trimester of pregnancy is from week 13 through week 27 (months 4 through 6).  Do not use herbal remedies, alcohol, illegal drugs, or medicines that are not approved by your health care provider. Chemicals in these products can harm your baby.  Exercise only as directed by your health care provider. Most people can continue their usual exercise routine during pregnancy.  Keep all follow-up visits. This is important. This information is not intended to replace advice given to you by   your health care provider. Make sure you discuss any questions you have with your health care provider. Document Revised: 03/29/2020 Document Reviewed: 02/03/2020 Elsevier Patient Education  2021 [employer]   Contraception Choices Contraception, also called birth control, refers to methods or devices that prevent pregnancy. Hormonal methods Contraceptive implant A contraceptive implant is a thin, plastic tube that contains a hormone that prevents pregnancy. It is different from an intrauterine device (IUD). It is inserted into the upper part of the arm by a health care provider. Implants can be effective for up to 3 years. Progestin-only injections Progestin-only injections are injections of progestin, a synthetic form of the hormone progesterone. They are given every 3 months by a health care provider. Birth control  pills Birth control pills are pills that contain hormones that prevent pregnancy. They must be taken once a day, preferably at the same time each day. A prescription is needed to use this method of contraception. Birth control patch The birth control patch contains hormones that prevent pregnancy. It is placed on the skin and must be changed once a week for three weeks and removed on the fourth week. A prescription is needed to use this method of contraception. Vaginal ring A vaginal ring contains hormones that prevent pregnancy. It is placed in the vagina for three weeks and removed on the fourth week. After that, the process is repeated with a new ring. A prescription is needed to use this method of contraception. Emergency contraceptive Emergency contraceptives prevent pregnancy after unprotected sex. They come in pill form and can be taken up to 5 days after sex. They work best the sooner they are taken after having sex. Most emergency contraceptives are available without a prescription. This method should not be used as your only form of birth control.   Barrier methods Female condom A female condom is a thin sheath that is worn over the penis during sex. Condoms keep sperm from going inside a woman's body. They can be used with a sperm-killing substance (spermicide) to increase their effectiveness. They should be thrown away after one use. Female condom A female condom is a soft, loose-fitting sheath that is put into the vagina before sex. The condom keeps sperm from going inside a woman's body. They should be thrown away after one use. Diaphragm A diaphragm is a soft, dome-shaped barrier. It is inserted into the vagina before sex, along with a spermicide. The diaphragm blocks sperm from entering the uterus, and the spermicide kills sperm. A diaphragm should be left in the vagina for 6-8 hours after sex and removed within 24 hours. A diaphragm is prescribed and fitted by a health care provider. A  diaphragm should be replaced every 1-2 years, after giving birth, after gaining more than 15 lb (6.8 kg), and after pelvic surgery. Cervical cap A cervical cap is a round, soft latex or plastic cup that fits over the cervix. It is inserted into the vagina before sex, along with spermicide. It blocks sperm from entering the uterus. The cap should be left in place for 6-8 hours after sex and removed within 48 hours. A cervical cap must be prescribed and fitted by a health care provider. It should be replaced every 2 years. Sponge A sponge is a soft, circular piece of polyurethane foam with spermicide in it. The sponge helps block sperm from entering the uterus, and the spermicide kills sperm. To use it, you make it wet and then insert it into the vagina. It should be   inserted before sex, left in for at least 6 hours after sex, and removed and thrown away within 30 hours. Spermicides Spermicides are chemicals that kill or block sperm from entering the cervix and uterus. They can come as a cream, jelly, suppository, foam, or tablet. A spermicide should be inserted into the vagina with an applicator at least 10-15 minutes before sex to allow time for it to work. The process must be repeated every time you have sex. Spermicides do not require a prescription.   Intrauterine contraception Intrauterine device (IUD) An IUD is a T-shaped device that is put in a woman's uterus. There are two types:  Hormone IUD.This type contains progestin, a synthetic form of the hormone progesterone. This type can stay in place for 3-5 years.  Copper IUD.This type is wrapped in copper wire. It can stay in place for 10 years. Permanent methods of contraception Female tubal ligation In this method, a woman's fallopian tubes are sealed, tied, or blocked during surgery to prevent eggs from traveling to the uterus. Hysteroscopic sterilization In this method, a small, flexible insert is placed into each fallopian tube. The inserts  cause scar tissue to form in the fallopian tubes and block them, so sperm cannot reach an egg. The procedure takes about 3 months to be effective. Another form of birth control must be used during those 3 months. Female sterilization This is a procedure to tie off the tubes that carry sperm (vasectomy). After the procedure, the man can still ejaculate fluid (semen). Another form of birth control must be used for 3 months after the procedure. Natural planning methods Natural family planning In this method, a couple does not have sex on days when the woman could become pregnant. Calendar method In this method, the woman keeps track of the length of each menstrual cycle, identifies the days when pregnancy can happen, and does not have sex on those days. Ovulation method In this method, a couple avoids sex during ovulation. Symptothermal method This method involves not having sex during ovulation. The woman typically checks for ovulation by watching changes in her temperature and in the consistency of cervical mucus. Post-ovulation method In this method, a couple waits to have sex until after ovulation. Where to find more information  Centers for Disease Control and Prevention: www.cdc.gov Summary  Contraception, also called birth control, refers to methods or devices that prevent pregnancy.  Hormonal methods of contraception include implants, injections, pills, patches, vaginal rings, and emergency contraceptives.  Barrier methods of contraception can include female condoms, female condoms, diaphragms, cervical caps, sponges, and spermicides.  There are two types of IUDs (intrauterine devices). An IUD can be put in a woman's uterus to prevent pregnancy for 3-5 years.  Permanent sterilization can be done through a procedure for males and females. Natural family planning methods involve nothaving sex on days when the woman could become pregnant. This information is not intended to replace advice  given to you by your health care provider. Make sure you discuss any questions you have with your health care provider. Document Revised: 03/27/2020 Document Reviewed: 03/27/2020 Elsevier Patient Education  2021 [employer] Inc.   Breastfeeding  Choosing to breastfeed is one of the best decisions you can make for yourself and your baby. A change in hormones during pregnancy causes your breasts to make breast milk in your milk-producing glands. Hormones prevent breast milk from being released before your baby is born. They also prompt milk flow after birth. Once breastfeeding has begun, thoughts of   your baby, as well as his or her sucking or crying, can stimulate the release of milk from your milk-producing glands. Benefits of breastfeeding Research shows that breastfeeding offers many health benefits for infants and mothers. It also offers a cost-free and convenient way to feed your baby. For your baby  Your first milk (colostrum) helps your baby's digestive system to function better.  Special cells in your milk (antibodies) help your baby to fight off infections.  Breastfed babies are less likely to develop asthma, allergies, obesity, or type 2 diabetes. They are also at lower risk for sudden infant death syndrome (SIDS).  Nutrients in breast milk are better able to meet your baby's needs compared to infant formula.  Breast milk improves your baby's brain development. For you  Breastfeeding helps to create a very special bond between you and your baby.  Breastfeeding is convenient. Breast milk costs nothing and is always available at the correct temperature.  Breastfeeding helps to burn calories. It helps you to lose the weight that you gained during pregnancy.  Breastfeeding makes your uterus return faster to its size before pregnancy. It also slows bleeding (lochia) after you give birth.  Breastfeeding helps to lower your risk of developing type 2 diabetes, osteoporosis, rheumatoid  arthritis, cardiovascular disease, and breast, ovarian, uterine, and endometrial cancer later in life. Breastfeeding basics Starting breastfeeding  Find a comfortable place to sit or lie down, with your neck and back well-supported.  Place a pillow or a rolled-up blanket under your baby to bring him or her to the level of your breast (if you are seated). Nursing pillows are specially designed to help support your arms and your baby while you breastfeed.  Make sure that your baby's tummy (abdomen) is facing your abdomen.  Gently massage your breast. With your fingertips, massage from the outer edges of your breast inward toward the nipple. This encourages milk flow. If your milk flows slowly, you may need to continue this action during the feeding.  Support your breast with 4 fingers underneath and your thumb above your nipple (make the letter "C" with your hand). Make sure your fingers are well away from your nipple and your baby's mouth.  Stroke your baby's lips gently with your finger or nipple.  When your baby's mouth is open wide enough, quickly bring your baby to your breast, placing your entire nipple and as much of the areola as possible into your baby's mouth. The areola is the colored area around your nipple. ? More areola should be visible above your baby's upper lip than below the lower lip. ? Your baby's lips should be opened and extended outward (flanged) to ensure an adequate, comfortable latch. ? Your baby's tongue should be between his or her lower gum and your breast.  Make sure that your baby's mouth is correctly positioned around your nipple (latched). Your baby's lips should create a seal on your breast and be turned out (everted).  It is common for your baby to suck about 2-3 minutes in order to start the flow of breast milk. Latching Teaching your baby how to latch onto your breast properly is very important. An improper latch can cause nipple pain, decreased milk  supply, and poor weight gain in your baby. Also, if your baby is not latched onto your nipple properly, he or she may swallow some air during feeding. This can make your baby fussy. Burping your baby when you switch breasts during the feeding can help to get rid   of the air. However, teaching your baby to latch on properly is still the best way to prevent fussiness from swallowing air while breastfeeding. Signs that your baby has successfully latched onto your nipple  Silent tugging or silent sucking, without causing you pain. Infant's lips should be extended outward (flanged).  Swallowing heard between every 3-4 sucks once your milk has started to flow (after your let-down milk reflex occurs).  Muscle movement above and in front of his or her ears while sucking. Signs that your baby has not successfully latched onto your nipple  Sucking sounds or smacking sounds from your baby while breastfeeding.  Nipple pain. If you think your baby has not latched on correctly, slip your finger into the corner of your baby's mouth to break the suction and place it between your baby's gums. Attempt to start breastfeeding again. Signs of successful breastfeeding Signs from your baby  Your baby will gradually decrease the number of sucks or will completely stop sucking.  Your baby will fall asleep.  Your baby's body will relax.  Your baby will retain a small amount of milk in his or her mouth.  Your baby will let go of your breast by himself or herself. Signs from you  Breasts that have increased in firmness, weight, and size 1-3 hours after feeding.  Breasts that are softer immediately after breastfeeding.  Increased milk volume, as well as a change in milk consistency and color by the fifth day of breastfeeding.  Nipples that are not sore, cracked, or bleeding. Signs that your baby is getting enough milk  Wetting at least 1-2 diapers during the first 24 hours after birth.  Wetting at least 5-6  diapers every 24 hours for the first week after birth. The urine should be clear or pale yellow by the age of 5 days.  Wetting 6-8 diapers every 24 hours as your baby continues to grow and develop.  At least 3 stools in a 24-hour period by the age of 5 days. The stool should be soft and yellow.  At least 3 stools in a 24-hour period by the age of 7 days. The stool should be seedy and yellow.  No loss of weight greater than 10% of birth weight during the first 3 days of life.  Average weight gain of 4-7 oz (113-198 g) per week after the age of 4 days.  Consistent daily weight gain by the age of 5 days, without weight loss after the age of 2 weeks. After a feeding, your baby may spit up a small amount of milk. This is normal. Breastfeeding frequency and duration Frequent feeding will help you make more milk and can prevent sore nipples and extremely full breasts (breast engorgement). Breastfeed when you feel the need to reduce the fullness of your breasts or when your baby shows signs of hunger. This is called "breastfeeding on demand." Signs that your baby is hungry include:  Increased alertness, activity, or restlessness.  Movement of the head from side to side.  Opening of the mouth when the corner of the mouth or cheek is stroked (rooting).  Increased sucking sounds, smacking lips, cooing, sighing, or squeaking.  Hand-to-mouth movements and sucking on fingers or hands.  Fussing or crying. Avoid introducing a pacifier to your baby in the first 4-6 weeks after your baby is born. After this time, you may choose to use a pacifier. Research has shown that pacifier use during the first year of a baby's life decreases the risk of   sudden infant death syndrome (SIDS). Allow your baby to feed on each breast as long as he or she wants. When your baby unlatches or falls asleep while feeding from the first breast, offer the second breast. Because newborns are often sleepy in the first few weeks of  life, you may need to awaken your baby to get him or her to feed. Breastfeeding times will vary from baby to baby. However, the following rules can serve as a guide to help you make sure that your baby is properly fed:  Newborns (babies 4 weeks of age or younger) may breastfeed every 1-3 hours.  Newborns should not go without breastfeeding for longer than 3 hours during the day or 5 hours during the night.  You should breastfeed your baby a minimum of 8 times in a 24-hour period. Breast milk pumping Pumping and storing breast milk allows you to make sure that your baby is exclusively fed your breast milk, even at times when you are unable to breastfeed. This is especially important if you go back to work while you are still breastfeeding, or if you are not able to be present during feedings. Your lactation consultant can help you find a method of pumping that works best for you and give you guidelines about how long it is safe to store breast milk.      Caring for your breasts while you breastfeed Nipples can become dry, cracked, and sore while breastfeeding. The following recommendations can help keep your breasts moisturized and healthy:  Avoid using soap on your nipples.  Wear a supportive bra designed especially for nursing. Avoid wearing underwire-style bras or extremely tight bras (sports bras).  Air-dry your nipples for 3-4 minutes after each feeding.  Use only cotton bra pads to absorb leaked breast milk. Leaking of breast milk between feedings is normal.  Use lanolin on your nipples after breastfeeding. Lanolin helps to maintain your skin's normal moisture barrier. Pure lanolin is not harmful (not toxic) to your baby. You may also hand express a few drops of breast milk and gently massage that milk into your nipples and allow the milk to air-dry. In the first few weeks after giving birth, some women experience breast engorgement. Engorgement can make your breasts feel heavy, warm,  and tender to the touch. Engorgement peaks within 3-5 days after you give birth. The following recommendations can help to ease engorgement:  Completely empty your breasts while breastfeeding or pumping. You may want to start by applying warm, moist heat (in the shower or with warm, water-soaked hand towels) just before feeding or pumping. This increases circulation and helps the milk flow. If your baby does not completely empty your breasts while breastfeeding, pump any extra milk after he or she is finished.  Apply ice packs to your breasts immediately after breastfeeding or pumping, unless this is too uncomfortable for you. To do this: ? Put ice in a plastic bag. ? Place a towel between your skin and the bag. ? Leave the ice on for 20 minutes, 2-3 times a day.  Make sure that your baby is latched on and positioned properly while breastfeeding. If engorgement persists after 48 hours of following these recommendations, contact your health care provider or a lactation consultant. Overall health care recommendations while breastfeeding  Eat 3 healthy meals and 3 snacks every day. Well-nourished mothers who are breastfeeding need an additional 450-500 calories a day. You can meet this requirement by increasing the amount of a balanced diet that   you eat.  Drink enough water to keep your urine pale yellow or clear.  Rest often, relax, and continue to take your prenatal vitamins to prevent fatigue, stress, and low vitamin and mineral levels in your body (nutrient deficiencies).  Do not use any products that contain nicotine or tobacco, such as cigarettes and e-cigarettes. Your baby may be harmed by chemicals from cigarettes that pass into breast milk and exposure to secondhand smoke. If you need help quitting, ask your health care provider.  Avoid alcohol.  Do not use illegal drugs or marijuana.  Talk with your health care provider before taking any medicines. These include over-the-counter and  prescription medicines as well as vitamins and herbal supplements. Some medicines that may be harmful to your baby can pass through breast milk.  It is possible to become pregnant while breastfeeding. If birth control is desired, ask your health care provider about options that will be safe while breastfeeding your baby. Where to find more information: [employer]: www.llli.org Contact a health care provider if:  You feel like you want to stop breastfeeding or have become frustrated with breastfeeding.  Your nipples are cracked or bleeding.  Your breasts are red, tender, or warm.  You have: ? Painful breasts or nipples. ? A swollen area on either breast. ? A fever or chills. ? Nausea or vomiting. ? Drainage other than breast milk from your nipples.  Your breasts do not become full before feedings by the fifth day after you give birth.  You feel sad and depressed.  Your baby is: ? Too sleepy to eat well. ? Having trouble sleeping. ? More than 1 week old and wetting fewer than 6 diapers in a 24-hour period. ? Not gaining weight by 5 days of age.  Your baby has fewer than 3 stools in a 24-hour period.  Your baby's skin or the white parts of his or her eyes become yellow. Get help right away if:  Your baby is overly tired (lethargic) and does not want to wake up and feed.  Your baby develops an unexplained fever. Summary  Breastfeeding offers many health benefits for infant and mothers.  Try to breastfeed your infant when he or she shows early signs of hunger.  Gently tickle or stroke your baby's lips with your finger or nipple to allow the baby to open his or her mouth. Bring the baby to your breast. Make sure that much of the areola is in your baby's mouth. Offer one side and burp the baby before you offer the other side.  Talk with your health care provider or lactation consultant if you have questions or you face problems as you breastfeed. This  information is not intended to replace advice given to you by your health care provider. Make sure you discuss any questions you have with your health care provider. Document Revised: 01/15/2018 Document Reviewed: 11/22/2016 Elsevier Patient Education  2021 Elsevier Inc.  

## 2021-03-21 NOTE — Progress Notes (Signed)
Unable to obtain FHR with doppler. Will obtain with bedside US.

## 2021-03-22 ENCOUNTER — Encounter: Payer: Self-pay | Admitting: *Deleted

## 2021-03-22 LAB — CBC/D/PLT+RPR+RH+ABO+RUBIGG...
Antibody Screen: NEGATIVE
Basophils Absolute: 0 10*3/uL (ref 0.0–0.2)
Basos: 0 %
EOS (ABSOLUTE): 0.1 10*3/uL (ref 0.0–0.4)
Eos: 2 %
HCV Ab: 0.1 s/co ratio (ref 0.0–0.9)
HIV Screen 4th Generation wRfx: NONREACTIVE
Hematocrit: 32.9 % — ABNORMAL LOW (ref 34.0–46.6)
Hemoglobin: 11.6 g/dL (ref 11.1–15.9)
Hepatitis B Surface Ag: NEGATIVE
Immature Grans (Abs): 0 10*3/uL (ref 0.0–0.1)
Immature Granulocytes: 0 %
Lymphocytes Absolute: 1.5 10*3/uL (ref 0.7–3.1)
Lymphs: 24 %
MCH: 32.8 pg (ref 26.6–33.0)
MCHC: 35.3 g/dL (ref 31.5–35.7)
MCV: 93 fL (ref 79–97)
Monocytes Absolute: 0.4 10*3/uL (ref 0.1–0.9)
Monocytes: 6 %
Neutrophils Absolute: 4.2 10*3/uL (ref 1.4–7.0)
Neutrophils: 68 %
Platelets: 189 10*3/uL (ref 150–450)
RBC: 3.54 x10E6/uL — ABNORMAL LOW (ref 3.77–5.28)
RDW: 11.5 % — ABNORMAL LOW (ref 11.7–15.4)
RPR Ser Ql: NONREACTIVE
Rh Factor: POSITIVE
Rubella Antibodies, IGG: 5.94 index (ref >0.99)
WBC: 6.2 10*3/uL (ref 3.4–10.8)

## 2021-03-22 LAB — HCV INTERPRETATION

## 2021-03-22 LAB — HEMOGLOBIN A1C
Est. average glucose Bld gHb Est-mCnc: 97 mg/dL
Hgb A1c MFr Bld: 5 % (ref 4.8–5.6)

## 2021-03-23 LAB — CULTURE, OB URINE

## 2021-03-23 LAB — URINE CULTURE, OB REFLEX

## 2021-03-26 LAB — CYTOLOGY - PAP
Chlamydia: NEGATIVE
Comment: NEGATIVE
Comment: NEGATIVE
Comment: NEGATIVE
Comment: NORMAL
Diagnosis: UNDETERMINED — AB
High risk HPV: NEGATIVE
Neisseria Gonorrhea: NEGATIVE
Trichomonas: NEGATIVE

## 2021-03-28 ENCOUNTER — Encounter: Payer: Self-pay | Admitting: *Deleted

## 2021-03-29 ENCOUNTER — Encounter: Payer: Self-pay | Admitting: *Deleted

## 2021-04-04 ENCOUNTER — Encounter: Payer: Self-pay | Admitting: *Deleted

## 2021-04-17 ENCOUNTER — Other Ambulatory Visit: Payer: Self-pay

## 2021-04-17 ENCOUNTER — Ambulatory Visit (INDEPENDENT_AMBULATORY_CARE_PROVIDER_SITE_OTHER): Payer: Medicaid Other

## 2021-04-17 VITALS — BP 117/66 | HR 97 | Wt 149.7 lb

## 2021-04-17 DIAGNOSIS — J4520 Mild intermittent asthma, uncomplicated: Secondary | ICD-10-CM

## 2021-04-17 DIAGNOSIS — Z3480 Encounter for supervision of other normal pregnancy, unspecified trimester: Secondary | ICD-10-CM

## 2021-04-17 DIAGNOSIS — Z3A15 15 weeks gestation of pregnancy: Secondary | ICD-10-CM

## 2021-04-17 NOTE — Progress Notes (Signed)
   PRENATAL VISIT NOTE  Subjective:  Eileen Graham is a 25 y.o. [phone] at [redacted]w[redacted]d being seen today for ongoing prenatal care.  She is currently monitored for the following issues for this low-risk pregnancy and has Intrauterine pregnancy; Morning sickness; Insomnia; Bacterial vaginosis; Supervision of other normal pregnancy, antepartum; and Asthma on their problem list.  Patient reports no complaints.  Contractions: Not present. Vag. Bleeding: None.  Movement: Present. Denies leaking of fluid.   The following portions of the patient's history were reviewed and updated as appropriate: allergies, current medications, past family history, past medical history, past social history, past surgical history and problem list.   Objective:   Vitals:   04/17/21 1001  BP: 117/66  Pulse: 97  Weight: 149 lb 11.2 oz (67.9 kg)    Fetal Status: Fetal Heart Rate (bpm): 151   Movement: Present     General:  Alert, oriented and cooperative. Patient is in no acute distress.  Skin: Skin is warm and dry. No rash noted.   Cardiovascular: Normal heart rate noted  Respiratory: Normal respiratory effort, no problems with respiration noted  Abdomen: Soft, gravid, appropriate for gestational age.  Pain/Pressure: Absent     Pelvic: Cervical exam deferred        Extremities: Normal range of motion.  Edema: Trace  Mental Status: Normal mood and affect. Normal behavior. Normal judgment and thought content.   Assessment and Plan:  Pregnancy: G4P1021 at [redacted]w[redacted]d  1. [redacted] weeks gestation of pregnancy   2. Supervision of other normal pregnancy, antepartum - Routine OB care, no complaints - Anticipatory guidance for upcoming appointments provided  - AFP, Serum, Open Spina Bifida  3. Mild intermittent asthma without complication - Well controlled with albuterol inhaler - Did not pick up Singulair, encouraged to pick up as soon as possible   Preterm labor symptoms and general obstetric precautions including but  not limited to vaginal bleeding, contractions, leaking of fluid and fetal movement were reviewed in detail with the patient. Please refer to After Visit Summary for other counseling recommendations.   Return in about 5 weeks (around 05/22/2021).  Future Appointments  Date Time Provider Department Center  05/14/2021 12:45 PM [facility] US4 [facility]  05/22/2021  1:15 PM Marylene Land, CNM [facility]    Brand Males, CNM 04/17/21 12:27 PM

## 2021-04-18 ENCOUNTER — Encounter: Payer: Medicaid Other | Admitting: Family Medicine

## 2021-04-19 LAB — AFP, SERUM, OPEN SPINA BIFIDA
AFP MoM: 1.15
AFP Value: 41.2 ng/mL
Gest. Age on Collection Date: 15.5 weeks
Maternal Age At EDD: 27.5 yr
OSBR Risk 1 IN: 10000
Test Results:: NEGATIVE
Weight: 149 [lb_av]

## 2021-05-14 ENCOUNTER — Ambulatory Visit: Payer: Medicaid Other | Attending: Family Medicine

## 2021-05-14 ENCOUNTER — Ambulatory Visit: Payer: Medicaid Other

## 2021-05-14 ENCOUNTER — Other Ambulatory Visit: Payer: Self-pay

## 2021-05-14 ENCOUNTER — Other Ambulatory Visit: Payer: Self-pay | Admitting: Family Medicine

## 2021-05-14 DIAGNOSIS — Z3480 Encounter for supervision of other normal pregnancy, unspecified trimester: Secondary | ICD-10-CM | POA: Diagnosis present

## 2021-05-22 ENCOUNTER — Encounter: Payer: Medicaid Other | Admitting: Student

## 2021-05-29 ENCOUNTER — Telehealth: Payer: Self-pay | Admitting: General Practice

## 2021-05-29 DIAGNOSIS — J4520 Mild intermittent asthma, uncomplicated: Secondary | ICD-10-CM

## 2021-05-29 DIAGNOSIS — Z3480 Encounter for supervision of other normal pregnancy, unspecified trimester: Secondary | ICD-10-CM

## 2021-05-29 MED ORDER — ALBUTEROL SULFATE HFA 108 (90 BASE) MCG/ACT IN AERS: 2.0000 | INHALATION_SPRAY | Freq: Four times a day (QID) | RESPIRATORY_TRACT | 0 refills | Status: DC | PRN

## 2021-05-29 MED ORDER — PRENATAL PLUS 27-1 MG PO TABS: 1.0000 | ORAL_TABLET | Freq: Every day | ORAL | 11 refills | Status: DC

## 2021-05-29 NOTE — Telephone Encounter (Signed)
Patient called into front office requesting refill on her albuterol inhaler as well as PNV. Refills sent per Dr Crissie Reese. Patient informed.

## 2021-05-30 ENCOUNTER — Encounter: Payer: Medicaid Other | Admitting: Family Medicine

## 2021-06-01 ENCOUNTER — Encounter: Payer: Self-pay | Admitting: Family Medicine

## 2021-06-01 ENCOUNTER — Ambulatory Visit (INDEPENDENT_AMBULATORY_CARE_PROVIDER_SITE_OTHER): Payer: Medicaid Other | Admitting: Family Medicine

## 2021-06-01 ENCOUNTER — Encounter: Payer: Medicaid Other | Admitting: Family Medicine

## 2021-06-01 ENCOUNTER — Other Ambulatory Visit: Payer: Self-pay

## 2021-06-01 VITALS — BP 108/70 | HR 116 | Wt 161.0 lb

## 2021-06-01 DIAGNOSIS — J4520 Mild intermittent asthma, uncomplicated: Secondary | ICD-10-CM

## 2021-06-01 DIAGNOSIS — R110 Nausea: Secondary | ICD-10-CM

## 2021-06-01 DIAGNOSIS — Z3480 Encounter for supervision of other normal pregnancy, unspecified trimester: Secondary | ICD-10-CM

## 2021-06-01 DIAGNOSIS — Z3A22 22 weeks gestation of pregnancy: Secondary | ICD-10-CM

## 2021-06-01 NOTE — Patient Instructions (Signed)
[facility] [address]   Any vaginal bleeding, leakage of fluid, or decreased movement--go to hospital above.

## 2021-06-01 NOTE — Progress Notes (Signed)
    Subjective:  Eileen Graham is a 25 y.o. [phone] at [redacted]w[redacted]d being seen today for ongoing prenatal care.  She is currently monitored for the following issues for this low-risk pregnancy and has Intrauterine pregnancy; Supervision of other normal pregnancy, antepartum; and Asthma on their problem list.  Patient reports nausea duration of pregnancy. Doesn't want to take diclegis at work because it makes her sleepy. Hasn't tried anything else. Feeling uncomfortable in general with summer heat.  Contractions: Not present. Vag. Bleeding: None.  Movement: Present. Denies leaking of fluid.   Undecided on contraception, does not want to be pregnant again. Heavy bleeding with nexplanon.   The following portions of the patient's history were reviewed and updated as appropriate: allergies, current medications, past family history, past medical history, past social history, past surgical history and problem list.   Objective:   Vitals:   06/01/21 1027  BP: 108/70  Pulse: (!) 116  Weight: 161 lb (73 kg)    Fetal Status: Fetal Heart Rate (bpm): 150 Fundal Height: 22 cm Movement: Present     General:  Alert, oriented and cooperative. Patient is in no acute distress.  Skin: Skin is warm and dry. No rash noted.   Cardiovascular: Normal heart rate noted  Respiratory: Normal respiratory effort, no problems with respiration noted  Abdomen: Soft, gravid, appropriate for gestational age. Pain/Pressure: Absent     Pelvic:  Cervical exam deferred        Extremities: Normal range of motion.  Edema: Trace  Mental Status: Normal mood and affect. Normal behavior. Normal judgment and thought content.    Assessment and Plan:  Pregnancy: G4P1021 at [redacted]w[redacted]d, doing well.   1. Supervision of other normal pregnancy, antepartum Routine care.   2. Mild intermittent asthma without complication Well controlled currently.   3. Nausea without vomiting Intermittent throughout duration of pregnancy. Can trial B6,  ginger, sea bands. Small frequent meals and adequate hydration.  4. [redacted] weeks gestation of pregnancy  5. Contraception counseling Discussed options. Undecided, however does not desire to be pregnant again.    Preterm labor symptoms and general obstetric precautions including but not limited to vaginal bleeding, contractions, leaking of fluid and fetal movement were reviewed in detail with the patient.  Please refer to After Visit Summary for other counseling recommendations.   Return in about 3 weeks (around 06/22/2021) for LROB (as to allow 3 weeks until 28 wk visit as well).    Allayne Stack, DO

## 2021-06-05 ENCOUNTER — Encounter: Payer: Medicaid Other | Admitting: Family Medicine

## 2021-06-25 ENCOUNTER — Other Ambulatory Visit: Payer: Self-pay

## 2021-06-25 ENCOUNTER — Ambulatory Visit (INDEPENDENT_AMBULATORY_CARE_PROVIDER_SITE_OTHER): Payer: Medicaid Other | Admitting: Nurse Practitioner

## 2021-06-25 ENCOUNTER — Telehealth: Payer: Self-pay

## 2021-06-25 VITALS — BP 110/71 | HR 99 | Wt 168.2 lb

## 2021-06-25 DIAGNOSIS — Z3A25 25 weeks gestation of pregnancy: Secondary | ICD-10-CM

## 2021-06-25 DIAGNOSIS — J3081 Allergic rhinitis due to animal (cat) (dog) hair and dander: Secondary | ICD-10-CM

## 2021-06-25 DIAGNOSIS — J45909 Unspecified asthma, uncomplicated: Secondary | ICD-10-CM

## 2021-06-25 DIAGNOSIS — Z3480 Encounter for supervision of other normal pregnancy, unspecified trimester: Secondary | ICD-10-CM

## 2021-06-25 MED ORDER — ALBUTEROL SULFATE (2.5 MG/3ML) 0.083% IN NEBU: 2.5000 mg | INHALATION_SOLUTION | Freq: Four times a day (QID) | RESPIRATORY_TRACT | 4 refills | Status: DC | PRN

## 2021-06-25 MED ORDER — ALBUTEROL SULFATE HFA 108 (90 BASE) MCG/ACT IN AERS: 2.0000 | INHALATION_SPRAY | RESPIRATORY_TRACT | 1 refills | Status: DC | PRN

## 2021-06-25 NOTE — Patient Instructions (Signed)
[email or web address] for more info on Contraception

## 2021-06-25 NOTE — Progress Notes (Signed)
Subjective:  Eileen Graham is a 25 y.o. [phone] at [redacted]w[redacted]d being seen today for ongoing prenatal care.  She is currently monitored for the following issues for this low-risk pregnancy and has Supervision of other normal pregnancy, antepartum; Asthma; and Allergic rhinitis on their problem list.  Patient reports  bruising on upper abdomen and needs refill of albuterol .  Contractions: Not present. Vag. Bleeding: None.  Movement: Present. Denies leaking of fluid.   The following portions of the patient's history were reviewed and updated as appropriate: allergies, current medications, past family history, past medical history, past social history, past surgical history and problem list. Problem list updated.  Objective:   Vitals:   06/25/21 1027  BP: 110/71  Pulse: 99  Weight: 168 lb 3.2 oz (76.3 kg)    Fetal Status: Fetal Heart Rate (bpm): 140   Movement: Present     General:  Alert, oriented and cooperative. Patient is in no acute distress.  Skin: Skin is warm and dry. No rash noted.   Cardiovascular: Normal heart rate noted  Respiratory: Normal respiratory effort, no problems with respiration noted  Abdomen: Soft, gravid, appropriate for gestational age. Pain/Pressure: Present     Pelvic:  Cervical exam deferred        Extremities: Normal range of motion.  Edema: Trace  Mental Status: Normal mood and affect. Normal behavior. Normal judgment and thought content.   Urinalysis:      Assessment and Plan:  Pregnancy: G4P1021 at [redacted]w[redacted]d  1. Supervision of other normal pregnancy, antepartum Reviewed fasting for next visit for glucola and reviewed TDAP Discussed contraception - does not want more children.  Given info on IUD.  Is very hesitant to use anything as she had difficulty with methods she has tried before.  Has heard of relative who had to have surgery due to her IUD, so is very hesitant to consider IUD.  Bled continuously on Nexplanon and would not recommend Depo either due to  bleeding.  Has used pills before but was difficult to fine a pill that did not worsen her menses rather than making menses better.  2. Persistent asthma without complication, unspecified asthma severity Uses inhaler daily - reviewed it needs to be as a rescue medicine and is not performing this way now.   Reports she is taking Singulair and cannot tell any difference in her asthma Reports she usually is hospitalized twice every summer for asthma problems. Nasal congestion today noted.  Reports that her friends have dogs that trigger her congestion and wheezing when she visits them. Does not have a doctor managing her asthma in [place] - did have a doctor previously before coming to [place].  Appointment scheduled and MyChart message sent to client. Advised using either claritin or zyrtec daily for congestion - uses benadryl sometimes but it works well but causes drowsiness Advised using flonase daily to help control nasal symptoms - client says it is "nasty" and does not use it. Asthma is not well regulated at this time and she is using albuterol for a variety of symptoms.  Is having difficulty getting albuterol inhaler filled as often as she is needing to use it. Has a nebulizer at home that she has had for many years.  Requesting albuterol to use in nebulizer especially when she is out of an inhaler.  Five doses prescribed to her pharmacy. Will refer for asthma evaluation and establishing of MD to manage asthma better.  - albuterol (VENTOLIN HFA) 108 (90 Base)  MCG/ACT inhaler; Inhale 2 puffs into the lungs every 4 (four) hours as needed for wheezing or shortness of breath.  Dispense: 8 g; Refill: 1 - Ambulatory referral to Allergy - albuterol (PROVENTIL) (2.5 MG/3ML) 0.083% nebulizer solution; Take 3 mLs (2.5 mg total) by nebulization every 6 (six) hours as needed for wheezing or shortness of breath. Use if inhaler is out or not working to resolve wheezing  Dispense: 75 mL; Refill: 4  3.  Allergic rhinitis due to animal hair and dander See documentation above  4. [redacted] weeks gestation of pregnancy   Preterm labor symptoms and general obstetric precautions including but not limited to vaginal bleeding, contractions, leaking of fluid and fetal movement were reviewed in detail with the patient. Please refer to After Visit Summary for other counseling recommendations.  Return in about 3 weeks (around 07/16/2021) for early AM appointment for fasting glucola and ROB.  Nolene Bernheim, RN, MSN, NP-BC Nurse Practitioner, [facility] [employer], [facility] 06/25/2021 11:53 AM

## 2021-06-25 NOTE — Telephone Encounter (Signed)
Called Pt to advise of Allergy& Asthma appt scheduled on 08/23/21 @ 9:30a. Pt verbalized understanding.

## 2021-06-25 NOTE — Progress Notes (Signed)
Pt sttes has bruising on upper stomach.

## 2021-07-03 ENCOUNTER — Inpatient Hospital Stay (HOSPITAL_COMMUNITY)
Admission: AD | Admit: 2021-07-03 | Discharge: 2021-07-03 | Disposition: A | Discharge status: Home / Self Care | Payer: Medicaid Other | Attending: Obstetrics & Gynecology | Admitting: Obstetrics & Gynecology

## 2021-07-03 ENCOUNTER — Encounter (HOSPITAL_COMMUNITY): Payer: Self-pay | Admitting: Obstetrics & Gynecology

## 2021-07-03 DIAGNOSIS — Z79899 Other long term (current) drug therapy: Secondary | ICD-10-CM | POA: Diagnosis not present

## 2021-07-03 DIAGNOSIS — O9A212 Injury, poisoning and certain other consequences of external causes complicating pregnancy, second trimester: Secondary | ICD-10-CM | POA: Diagnosis not present

## 2021-07-03 DIAGNOSIS — O26892 Other specified pregnancy related conditions, second trimester: Secondary | ICD-10-CM | POA: Diagnosis not present

## 2021-07-03 DIAGNOSIS — O99891 Other specified diseases and conditions complicating pregnancy: Secondary | ICD-10-CM | POA: Diagnosis not present

## 2021-07-03 DIAGNOSIS — K5900 Constipation, unspecified: Secondary | ICD-10-CM | POA: Diagnosis not present

## 2021-07-03 DIAGNOSIS — Z3A26 26 weeks gestation of pregnancy: Secondary | ICD-10-CM

## 2021-07-03 DIAGNOSIS — Z87891 Personal history of nicotine dependence: Secondary | ICD-10-CM | POA: Insufficient documentation

## 2021-07-03 DIAGNOSIS — S39011A Strain of muscle, fascia and tendon of abdomen, initial encounter: Secondary | ICD-10-CM

## 2021-07-03 DIAGNOSIS — Z3480 Encounter for supervision of other normal pregnancy, unspecified trimester: Secondary | ICD-10-CM

## 2021-07-03 DIAGNOSIS — O99612 Diseases of the digestive system complicating pregnancy, second trimester: Secondary | ICD-10-CM | POA: Diagnosis not present

## 2021-07-03 LAB — URINALYSIS, ROUTINE W REFLEX MICROSCOPIC
Bilirubin Urine: NEGATIVE
Glucose, UA: NEGATIVE mg/dL
Hgb urine dipstick: NEGATIVE
Ketones, ur: NEGATIVE mg/dL
Leukocytes,Ua: NEGATIVE
Nitrite: NEGATIVE
Protein, ur: NEGATIVE mg/dL
Specific Gravity, Urine: 1.021 (ref 1.005–1.030)
pH: 6 (ref 5.0–8.0)

## 2021-07-03 MED ORDER — ACETAMINOPHEN 500 MG PO TABS
1000.0000 mg | ORAL_TABLET | Freq: Once | ORAL | Status: AC
  Administered 2021-07-03: 1000 mg via ORAL
  Filled 2021-07-03: qty 2

## 2021-07-03 MED ORDER — CYCLOBENZAPRINE HCL 5 MG PO TABS
5.0000 mg | ORAL_TABLET | Freq: Every evening | ORAL | 0 refills | Status: DC | PRN
Start: 2021-07-03 — End: 2021-08-07

## 2021-07-03 MED ORDER — CYCLOBENZAPRINE HCL 5 MG PO TABS
5.0000 mg | ORAL_TABLET | Freq: Once | ORAL | Status: AC
  Administered 2021-07-03: 5 mg via ORAL
  Filled 2021-07-03: qty 1

## 2021-07-03 NOTE — Discharge Instructions (Signed)
Take the muscle relaxer medication (Flexeril/Cyclobenzaprine) at bedtime as needed for muscle pain. You can take Tylenol 1000 mg every 8 hours as needed for pain relief as well. Use heat or ice on the area if this helps with your symptoms. Avoid heavy lifting. Return to MAU if your symptoms worsen or become severe despite treatment.

## 2021-07-03 NOTE — MAU Note (Signed)
Pt reports she was at work and "lifted something heavier than she probably should not have." Reports feeling a pain in her stomach midline around belly button area stated she feels like a "lump that is tender and is just sore in general in her abd. Reports fetal movement but not sure if it is like normal due to her pain and worrying. Denies any vag bleeding or unusual discharge.

## 2021-07-03 NOTE — MAU Provider Note (Signed)
History    [id number]  Arrival date and time: 07/03/21 1722   Chief Complaint  Patient presents with   Abdominal Pain   HPI Eileen Graham is a 25 y.o. at [redacted]w[redacted]d by LMP who presents to MAU with abdominal pain. Patient states that she was lifting boxes at work over the weekend and started having pain in the middle of her upper abdomen. It is worse with movement. It does not hurt at rest. She has been eating and drinking normally. She reports being constipated with this pregnancy but has been passing gas normally. No dysuria or vaginal discharge. It hurts to touch the area a little bit. It is also worse with coughing or laughing. She has not noticed anything protruding out of her abdomen with valsalva maneuvers. Her sister noticed that maybe the area looked more swollen, therefore, she came to MAU for further evaluation.   Vaginal bleeding: No LOF: No Fetal Movement: Yes Contractions: No  O/Positive/-- (05/18 1147)  OB History     Gravida  4   Para  1   Term  1   Preterm  0   AB  2   Living  1      SAB  0   IAB  2   Ectopic  0   Multiple  0   Live Births  1           Past Medical History:  Diagnosis Date   Alopecia    Asthma    Headache    Infection    UTI   Shingles    Trichomonas infection     Past Surgical History:  Procedure Laterality Date   abortion     x2   NO PAST SURGERIES      Family History  Problem Relation Age of Onset   Hypertension Mother    Healthy Father    Asthma Maternal Grandfather    Congestive Heart Failure Maternal Grandfather     Social History   Socioeconomic History   Marital status: Single    Spouse name: Not on file   Number of children: Not on file   Years of education: Not on file   Highest education level: Not on file  Occupational History   Not on file  Tobacco Use   Smoking status: Former    Types: Cigarettes   Smokeless tobacco: Never   Tobacco comments:    "years ago"  Vaping Use   Vaping  Use: Former   Devices: stopped with +UPT  Substance and Sexual Activity   Alcohol use: Not Currently   Drug use: Not Currently    Types: Marijuana    Comment: August 2020   Sexual activity: Yes    Birth control/protection: None  Other Topics Concern   Not on file  Social History Narrative   ** Merged History Encounter **       Social Determinants of Health   Financial Resource Strain: Not on file  Food Insecurity: No Food Insecurity   Worried About Programme researcher, broadcasting/film/video in the Last Year: Never true   Ran Out of Food in the Last Year: Never true  Transportation Needs: No Transportation Needs   Lack of Transportation (Medical): No   Lack of Transportation (Non-Medical): No  Physical Activity: Not on file  Stress: Not on file  Social Connections: Not on file  Intimate Partner Violence: Not on file    No Known Allergies  No current facility-administered medications on file prior to  encounter.   Current Outpatient Medications on File Prior to Encounter  Medication Sig Dispense Refill   albuterol (VENTOLIN HFA) 108 (90 Base) MCG/ACT inhaler Inhale 2 puffs into the lungs every 4 (four) hours as needed for wheezing or shortness of breath. 8 g 1   montelukast (SINGULAIR) 10 MG tablet Take 1 tablet (10 mg total) by mouth at bedtime. 30 tablet 11   Prenatal Vit-Fe Fumarate-FA (PRENATAL MULTIVITAMIN) TABS tablet Take 1 tablet by mouth daily at 12 noon.     albuterol (PROVENTIL) (2.5 MG/3ML) 0.083% nebulizer solution Take 3 mLs (2.5 mg total) by nebulization every 6 (six) hours as needed for wheezing or shortness of breath. Use if inhaler is out or not working to resolve wheezing 75 mL 4   Blood Pressure Monitoring DEVI 1 each by Does not apply route once a week. 1 each 0   prenatal vitamin w/FE, FA (PRENATAL 1 + 1) 27-1 MG TABS tablet Take 1 tablet by mouth daily at 12 noon. 30 tablet 11   [DISCONTINUED] famotidine (PEPCID) 40 MG tablet Take 1 tablet (40 mg total) by mouth every evening.  30 tablet 1   [DISCONTINUED] glycopyrrolate (ROBINUL) 2 MG tablet Take 1 tablet (2 mg total) by mouth 3 (three) times daily as needed. 30 tablet 3   [DISCONTINUED] loratadine (CLARITIN) 10 MG tablet Take 1 tablet (10 mg total) by mouth daily. 30 tablet 0   [DISCONTINUED] metoCLOPramide (REGLAN) 10 MG tablet Take 1 tablet (10 mg total) by mouth 3 (three) times daily with meals. 90 tablet 0   [DISCONTINUED] potassium chloride (KLOR-CON) 10 MEQ tablet Take 1 tablet (10 mEq total) by mouth daily. 5 tablet 0     ROS Pertinent positives and negative per HPI, all others reviewed and negative  Physical Exam   BP 115/64 (BP Location: Right Arm)   Pulse 86   Temp 98.5 F (36.9 C)   Resp 20   Ht 5\' 3"  (1.6 m)   Wt 77.1 kg   LMP 12/28/2020 (Exact Date)   SpO2 100%   BMI 30.11 kg/m   Physical Exam Constitutional:      General: She is not in acute distress.    Appearance: She is well-developed.  HENT:     Head: Normocephalic and atraumatic.  Cardiovascular:     Rate and Rhythm: Normal rate.  Pulmonary:     Effort: Pulmonary effort is normal.  Abdominal:     Palpations: Abdomen is soft.     Tenderness: There is abdominal tenderness (mildly tender to palpation over epigastric region, positive Carnett's sign). There is no guarding or rebound.     Comments: Gravid, no abdominal wall defect noted at rest, no evidence of hernia with valsalva.  Skin:    General: Skin is warm and dry.  Neurological:     General: No focal deficit present.     Mental Status: She is alert and oriented to person, place, and time.  Psychiatric:        Mood and Affect: Mood normal.        Behavior: Behavior normal.   FHT Baseline 140 bpm, moderate variability, + accels, - decels Toco: None Category 1 tracing  Labs Results for orders placed or performed during the hospital encounter of 07/03/21 (from the past 24 hour(s))  Urinalysis, Routine w reflex microscopic Urine, Clean Catch     Status: Abnormal    Collection Time: 07/03/21  6:26 PM  Result Value Ref Range   Color, Urine YELLOW YELLOW  APPearance HAZY (A) CLEAR   Specific Gravity, Urine 1.021 1.005 - 1.030   pH 6.0 5.0 - 8.0   Glucose, UA NEGATIVE NEGATIVE mg/dL   Hgb urine dipstick NEGATIVE NEGATIVE   Bilirubin Urine NEGATIVE NEGATIVE   Ketones, ur NEGATIVE NEGATIVE mg/dL   Protein, ur NEGATIVE NEGATIVE mg/dL   Nitrite NEGATIVE NEGATIVE   Leukocytes,Ua NEGATIVE NEGATIVE    Imaging No results found.  MAU Course  Procedures Lab Orders         Urinalysis, Routine w reflex microscopic Urine, Clean Catch    Meds ordered this encounter  Medications   cyclobenzaprine (FLEXERIL) tablet 5 mg   acetaminophen (TYLENOL) tablet 1,000 mg   cyclobenzaprine (FLEXERIL) 5 MG tablet    Sig: Take 1 tablet (5 mg total) by mouth at bedtime as needed (muscle pain).    Dispense:  30 tablet    Refill:  0   Imaging Orders  No imaging studies ordered today    MDM Abdominal pain with movement after heavy lifting at work No evidence of hernia as above Exam consistent with muscular strain Tylenol and Flexeril given in MAU with some improvement  No other OB complaints; FHT normal  Assessment and Plan   1. Strain of abdominal wall, initial encounter - Stable for discharge home with further outpatient management  - Flexeril sent to pharmacy to assist with muscular pain - Recommended Tylenol PRN for pain relief as well - Advised to use heat/ice PRN should this improve her symptoms  - Avoid heavy lifting - Strict return precautions reviewed and patient voiced understanding   Worthy Rancher, MD  [facility] Fellow  Faculty Practice

## 2021-07-04 ENCOUNTER — Telehealth: Payer: Self-pay | Admitting: Lactation Services

## 2021-07-04 NOTE — Telephone Encounter (Signed)
Called and spoke with patient. She asked that the NP give her a call back.   Patient reports she felt a lump in her stomach that was painful and went to MAU yesterday and was told she strained a muscle. She is taking Flexeril and Tylenol and is feeling better today. She has no other questions or concerns at this time.

## 2021-07-18 ENCOUNTER — Other Ambulatory Visit: Payer: Self-pay

## 2021-07-18 ENCOUNTER — Other Ambulatory Visit: Payer: Medicaid Other

## 2021-07-18 ENCOUNTER — Ambulatory Visit (INDEPENDENT_AMBULATORY_CARE_PROVIDER_SITE_OTHER): Payer: Medicaid Other | Admitting: Obstetrics and Gynecology

## 2021-07-18 VITALS — BP 121/64 | HR 103 | Wt 176.4 lb

## 2021-07-18 DIAGNOSIS — Z3480 Encounter for supervision of other normal pregnancy, unspecified trimester: Secondary | ICD-10-CM

## 2021-07-18 DIAGNOSIS — J45909 Unspecified asthma, uncomplicated: Secondary | ICD-10-CM

## 2021-07-18 DIAGNOSIS — O99513 Diseases of the respiratory system complicating pregnancy, third trimester: Secondary | ICD-10-CM

## 2021-07-18 MED ORDER — FLUTICASONE PROPIONATE HFA 110 MCG/ACT IN AERO: 2.0000 | INHALATION_SPRAY | Freq: Two times a day (BID) | RESPIRATORY_TRACT | 12 refills | Status: DC

## 2021-07-18 NOTE — Progress Notes (Signed)
   PRENATAL VISIT NOTE  Subjective:  Eileen Graham is a 25 y.o. [phone] at [redacted]w[redacted]d being seen today for ongoing prenatal care.  She is currently monitored for the following issues for this low-risk pregnancy and has Supervision of other normal pregnancy, antepartum; Asthma; and Allergic rhinitis on their problem list.  Patient reports no complaints.  Contractions: Not present. Vag. Bleeding: None.  Movement: Present. Denies leaking of fluid.   The following portions of the patient's history were reviewed and updated as appropriate: allergies, current medications, past family history, past medical history, past social history, past surgical history and problem list.   Objective:   Vitals:   07/18/21 1022  BP: 121/64  Pulse: (!) 103  Weight: 176 lb 6.4 oz (80 kg)    Fetal Status: Fetal Heart Rate (bpm): 140 Fundal Height: 29 cm Movement: Present     General:  Alert, oriented and cooperative. Patient is in no acute distress.  Skin: Skin is warm and dry. No rash noted.   Cardiovascular: Normal heart rate noted  Respiratory: Normal respiratory effort, no problems with respiration noted  Abdomen: Soft, gravid, appropriate for gestational age.  Pain/Pressure: Present     Pelvic: Cervical exam deferred        Extremities: Normal range of motion.  Edema: Trace  Mental Status: Normal mood and affect. Normal behavior. Normal judgment and thought content.   Assessment and Plan:  Pregnancy: G4P1021 at [redacted]w[redacted]d 1. Asthma affecting pregnancy in third trimester Pt has never used a maitnenace inhaler. She states she is on singulair and albuterol and uses it not every day but several times a week. I told her I recommend doing a maitenance INH bid which she is amenable to and to continue on the singulair. Flovent sent in and will do growth u/s given asthma  Pt has allergy visit on 10/20 - [name] MFM OB FOLLOW UP; Future  2. Pregnancy 28wk labs today.   Preterm labor symptoms and general obstetric  precautions including but not limited to vaginal bleeding, contractions, leaking of fluid and fetal movement were reviewed in detail with the patient. Please refer to After Visit Summary for other counseling recommendations.   No follow-ups on file.  Future Appointments  Date Time Provider Department Center  08/23/2021  9:30 AM Marcelyn Bruins, MD [facility] None    Beecher Bing, MD

## 2021-07-19 LAB — CBC
Hematocrit: 31.4 % — ABNORMAL LOW (ref 34.0–46.6)
Hemoglobin: 11.2 g/dL (ref 11.1–15.9)
MCH: 33.1 pg — ABNORMAL HIGH (ref 26.6–33.0)
MCHC: 35.7 g/dL (ref 31.5–35.7)
MCV: 93 fL (ref 79–97)
Platelets: 151 10*3/uL (ref 150–450)
RBC: 3.38 x10E6/uL — ABNORMAL LOW (ref 3.77–5.28)
RDW: 11.7 % (ref 11.7–15.4)
WBC: 7.9 10*3/uL (ref 3.4–10.8)

## 2021-07-19 LAB — HIV ANTIBODY (ROUTINE TESTING W REFLEX): HIV Screen 4th Generation wRfx: NONREACTIVE

## 2021-07-19 LAB — GLUCOSE TOLERANCE, 2 HOURS W/ 1HR
Glucose, 1 hour: 114 mg/dL (ref 65–179)
Glucose, 2 hour: 110 mg/dL (ref 65–152)
Glucose, Fasting: 81 mg/dL (ref 65–91)

## 2021-07-19 LAB — RPR: RPR Ser Ql: NONREACTIVE

## 2021-08-07 ENCOUNTER — Ambulatory Visit (INDEPENDENT_AMBULATORY_CARE_PROVIDER_SITE_OTHER): Payer: Medicaid Other | Admitting: Obstetrics and Gynecology

## 2021-08-07 ENCOUNTER — Ambulatory Visit: Payer: Medicaid Other | Attending: Obstetrics and Gynecology

## 2021-08-07 ENCOUNTER — Other Ambulatory Visit: Payer: Self-pay | Admitting: Obstetrics and Gynecology

## 2021-08-07 ENCOUNTER — Encounter: Payer: Self-pay | Admitting: Obstetrics and Gynecology

## 2021-08-07 ENCOUNTER — Other Ambulatory Visit: Payer: Self-pay

## 2021-08-07 VITALS — BP 108/72 | HR 92 | Wt 174.6 lb

## 2021-08-07 DIAGNOSIS — J45909 Unspecified asthma, uncomplicated: Secondary | ICD-10-CM

## 2021-08-07 DIAGNOSIS — O403XX0 Polyhydramnios, third trimester, not applicable or unspecified: Secondary | ICD-10-CM | POA: Insufficient documentation

## 2021-08-07 DIAGNOSIS — Z3480 Encounter for supervision of other normal pregnancy, unspecified trimester: Secondary | ICD-10-CM

## 2021-08-07 DIAGNOSIS — Z3A31 31 weeks gestation of pregnancy: Secondary | ICD-10-CM

## 2021-08-07 DIAGNOSIS — Z23 Encounter for immunization: Secondary | ICD-10-CM | POA: Diagnosis not present

## 2021-08-07 DIAGNOSIS — O99513 Diseases of the respiratory system complicating pregnancy, third trimester: Secondary | ICD-10-CM | POA: Diagnosis present

## 2021-08-07 DIAGNOSIS — Z3009 Encounter for other general counseling and advice on contraception: Secondary | ICD-10-CM | POA: Insufficient documentation

## 2021-08-07 DIAGNOSIS — O358XX0 Maternal care for other (suspected) fetal abnormality and damage, not applicable or unspecified: Secondary | ICD-10-CM

## 2021-08-07 NOTE — Patient Instructions (Signed)

## 2021-08-07 NOTE — Progress Notes (Signed)
Subjective:  Eileen Graham is a 25 y.o. [phone] at [redacted]w[redacted]d being seen today for ongoing prenatal care.  She is currently monitored for the following issues for this low-risk pregnancy and has Supervision of other normal pregnancy, antepartum; Asthma; and Allergic rhinitis on their problem list.  Patient reports general discomforts of pregnancy.  Contractions: Not present. Vag. Bleeding: None.  Movement: Present. Denies leaking of fluid.   The following portions of the patient's history were reviewed and updated as appropriate: allergies, current medications, past family history, past medical history, past social history, past surgical history and problem list. Problem list updated.  Objective:   Vitals:   08/07/21 0921  BP: 108/72  Pulse: 92  Weight: 174 lb 9.6 oz (79.2 kg)    Fetal Status: Fetal Heart Rate (bpm): 135   Movement: Present     General:  Alert, oriented and cooperative. Patient is in no acute distress.  Skin: Skin is warm and dry. No rash noted.   Cardiovascular: Normal heart rate noted  Respiratory: Normal respiratory effort, no problems with respiration noted  Abdomen: Soft, gravid, appropriate for gestational age. Pain/Pressure: Present     Pelvic:  Cervical exam deferred        Extremities: Normal range of motion.  Edema: Trace  Mental Status: Normal mood and affect. Normal behavior. Normal judgment and thought content.   Urinalysis:      Assessment and Plan:  Pregnancy: G4P1021 at [redacted]w[redacted]d  1. Supervision of other normal pregnancy, antepartum Stable - Tdap vaccine greater than or equal to 7yo IM  2. Persistent asthma without complication, unspecified asthma severity Stable  3. Unwanted fertility BTL papers signed today  Preterm labor symptoms and general obstetric precautions including but not limited to vaginal bleeding, contractions, leaking of fluid and fetal movement were reviewed in detail with the patient. Please refer to After Visit Summary for other  counseling recommendations.  Return in about 2 weeks (around 08/21/2021) for OB visit, face to face, any provider.   Hermina Staggers, MD

## 2021-08-10 ENCOUNTER — Other Ambulatory Visit: Payer: Self-pay | Admitting: Obstetrics

## 2021-08-10 DIAGNOSIS — Z362 Encounter for other antenatal screening follow-up: Secondary | ICD-10-CM

## 2021-08-10 DIAGNOSIS — O35BXX0 Maternal care for other (suspected) fetal abnormality and damage, fetal cardiac anomalies, not applicable or unspecified: Secondary | ICD-10-CM

## 2021-08-10 DIAGNOSIS — O403XX0 Polyhydramnios, third trimester, not applicable or unspecified: Secondary | ICD-10-CM

## 2021-08-13 ENCOUNTER — Other Ambulatory Visit: Payer: Self-pay | Admitting: *Deleted

## 2021-08-13 DIAGNOSIS — O409XX0 Polyhydramnios, unspecified trimester, not applicable or unspecified: Secondary | ICD-10-CM

## 2021-08-14 ENCOUNTER — Telehealth: Payer: Self-pay | Admitting: Family Medicine

## 2021-08-14 ENCOUNTER — Ambulatory Visit: Payer: Medicaid Other | Attending: Obstetrics

## 2021-08-14 ENCOUNTER — Ambulatory Visit: Payer: Medicaid Other | Admitting: *Deleted

## 2021-08-14 ENCOUNTER — Other Ambulatory Visit: Payer: Self-pay

## 2021-08-14 VITALS — BP 118/65 | HR 97

## 2021-08-14 DIAGNOSIS — Z362 Encounter for other antenatal screening follow-up: Secondary | ICD-10-CM | POA: Diagnosis present

## 2021-08-14 DIAGNOSIS — Z3490 Encounter for supervision of normal pregnancy, unspecified, unspecified trimester: Secondary | ICD-10-CM | POA: Insufficient documentation

## 2021-08-14 DIAGNOSIS — Z3A32 32 weeks gestation of pregnancy: Secondary | ICD-10-CM | POA: Diagnosis not present

## 2021-08-14 DIAGNOSIS — Z3480 Encounter for supervision of other normal pregnancy, unspecified trimester: Secondary | ICD-10-CM

## 2021-08-14 DIAGNOSIS — O403XX0 Polyhydramnios, third trimester, not applicable or unspecified: Secondary | ICD-10-CM | POA: Insufficient documentation

## 2021-08-14 DIAGNOSIS — Z3A00 Weeks of gestation of pregnancy not specified: Secondary | ICD-10-CM | POA: Insufficient documentation

## 2021-08-14 DIAGNOSIS — O35BXX0 Maternal care for other (suspected) fetal abnormality and damage, fetal cardiac anomalies, not applicable or unspecified: Secondary | ICD-10-CM | POA: Insufficient documentation

## 2021-08-22 ENCOUNTER — Ambulatory Visit: Payer: Medicaid Other | Admitting: *Deleted

## 2021-08-22 ENCOUNTER — Other Ambulatory Visit: Payer: Self-pay

## 2021-08-22 ENCOUNTER — Ambulatory Visit (HOSPITAL_BASED_OUTPATIENT_CLINIC_OR_DEPARTMENT_OTHER): Payer: Medicaid Other

## 2021-08-22 VITALS — BP 116/65 | HR 98

## 2021-08-22 DIAGNOSIS — O35BXX0 Maternal care for other (suspected) fetal abnormality and damage, fetal cardiac anomalies, not applicable or unspecified: Secondary | ICD-10-CM | POA: Diagnosis not present

## 2021-08-22 DIAGNOSIS — O403XX0 Polyhydramnios, third trimester, not applicable or unspecified: Secondary | ICD-10-CM | POA: Diagnosis not present

## 2021-08-22 DIAGNOSIS — O99513 Diseases of the respiratory system complicating pregnancy, third trimester: Secondary | ICD-10-CM | POA: Diagnosis not present

## 2021-08-22 DIAGNOSIS — Z362 Encounter for other antenatal screening follow-up: Secondary | ICD-10-CM | POA: Diagnosis not present

## 2021-08-22 DIAGNOSIS — J45909 Unspecified asthma, uncomplicated: Secondary | ICD-10-CM

## 2021-08-22 DIAGNOSIS — Z3480 Encounter for supervision of other normal pregnancy, unspecified trimester: Secondary | ICD-10-CM

## 2021-08-22 DIAGNOSIS — Z3A33 33 weeks gestation of pregnancy: Secondary | ICD-10-CM

## 2021-08-23 ENCOUNTER — Ambulatory Visit: Payer: Medicaid Other | Admitting: Allergy

## 2021-08-28 ENCOUNTER — Encounter: Payer: Self-pay | Admitting: *Deleted

## 2021-08-28 ENCOUNTER — Other Ambulatory Visit: Payer: Self-pay | Admitting: *Deleted

## 2021-08-28 ENCOUNTER — Other Ambulatory Visit: Payer: Self-pay

## 2021-08-28 ENCOUNTER — Ambulatory Visit: Payer: Medicaid Other | Attending: Obstetrics

## 2021-08-28 ENCOUNTER — Ambulatory Visit: Payer: Medicaid Other | Admitting: *Deleted

## 2021-08-28 ENCOUNTER — Encounter: Payer: Medicaid Other | Admitting: Family Medicine

## 2021-08-28 VITALS — BP 111/64 | HR 97

## 2021-08-28 DIAGNOSIS — O403XX0 Polyhydramnios, third trimester, not applicable or unspecified: Secondary | ICD-10-CM | POA: Diagnosis present

## 2021-08-28 DIAGNOSIS — Z3A34 34 weeks gestation of pregnancy: Secondary | ICD-10-CM

## 2021-08-28 DIAGNOSIS — Z3480 Encounter for supervision of other normal pregnancy, unspecified trimester: Secondary | ICD-10-CM | POA: Diagnosis present

## 2021-08-28 DIAGNOSIS — Z362 Encounter for other antenatal screening follow-up: Secondary | ICD-10-CM | POA: Diagnosis present

## 2021-08-28 DIAGNOSIS — O35BXX0 Maternal care for other (suspected) fetal abnormality and damage, fetal cardiac anomalies, not applicable or unspecified: Secondary | ICD-10-CM

## 2021-09-04 ENCOUNTER — Ambulatory Visit: Payer: Medicaid Other | Admitting: *Deleted

## 2021-09-04 ENCOUNTER — Encounter: Payer: Medicaid Other | Admitting: Family Medicine

## 2021-09-04 ENCOUNTER — Ambulatory Visit: Payer: Medicaid Other | Attending: Obstetrics

## 2021-09-04 ENCOUNTER — Other Ambulatory Visit: Payer: Self-pay

## 2021-09-04 ENCOUNTER — Other Ambulatory Visit: Payer: Self-pay | Admitting: Obstetrics

## 2021-09-04 ENCOUNTER — Inpatient Hospital Stay (HOSPITAL_COMMUNITY)
Admission: AD | Admit: 2021-09-04 | Discharge: 2021-09-04 | Disposition: A | Discharge status: Home / Self Care | Payer: Medicaid Other | Attending: Obstetrics & Gynecology | Admitting: Obstetrics & Gynecology

## 2021-09-04 ENCOUNTER — Inpatient Hospital Stay (HOSPITAL_BASED_OUTPATIENT_CLINIC_OR_DEPARTMENT_OTHER): Payer: Medicaid Other | Admitting: Obstetrics

## 2021-09-04 ENCOUNTER — Encounter (HOSPITAL_COMMUNITY): Payer: Self-pay | Admitting: Obstetrics & Gynecology

## 2021-09-04 VITALS — BP 121/66 | HR 110

## 2021-09-04 DIAGNOSIS — O283 Abnormal ultrasonic finding on antenatal screening of mother: Secondary | ICD-10-CM

## 2021-09-04 DIAGNOSIS — O403XX0 Polyhydramnios, third trimester, not applicable or unspecified: Secondary | ICD-10-CM

## 2021-09-04 DIAGNOSIS — Z87891 Personal history of nicotine dependence: Secondary | ICD-10-CM | POA: Insufficient documentation

## 2021-09-04 DIAGNOSIS — Z7951 Long term (current) use of inhaled steroids: Secondary | ICD-10-CM | POA: Insufficient documentation

## 2021-09-04 DIAGNOSIS — Z3A35 35 weeks gestation of pregnancy: Secondary | ICD-10-CM

## 2021-09-04 DIAGNOSIS — O35BXX0 Maternal care for other (suspected) fetal abnormality and damage, fetal cardiac anomalies, not applicable or unspecified: Secondary | ICD-10-CM | POA: Insufficient documentation

## 2021-09-04 DIAGNOSIS — Z3480 Encounter for supervision of other normal pregnancy, unspecified trimester: Secondary | ICD-10-CM | POA: Insufficient documentation

## 2021-09-04 DIAGNOSIS — Z362 Encounter for other antenatal screening follow-up: Secondary | ICD-10-CM | POA: Diagnosis not present

## 2021-09-04 DIAGNOSIS — Z3689 Encounter for other specified antenatal screening: Secondary | ICD-10-CM

## 2021-09-04 DIAGNOSIS — O288 Other abnormal findings on antenatal screening of mother: Secondary | ICD-10-CM

## 2021-09-04 NOTE — MAU Provider Note (Signed)
History     CSN: [id number]  Arrival date and time: 09/04/21 1505  Chief Complaint  Patient presents with   EFM   25 y.o. [id number] @35 .5 wks presenting from office for NST. Had BPP today 6/8 and NRNST. Her pregnancy is complicated by polyhydramnios. Reports good FM. Denies VB, LOF, ctx. Reports increased pelvic discomfort.     OB History     Gravida  4   Para  1   Term  1   Preterm  0   AB  2   Living  1      SAB  0   IAB  2   Ectopic  0   Multiple  0   Live Births  1           Past Medical History:  Diagnosis Date   Alopecia    Asthma    Headache    Infection    UTI   Shingles    Trichomonas infection     Past Surgical History:  Procedure Laterality Date   abortion     x2   NO PAST SURGERIES      Family History  Problem Relation Age of Onset   Hypertension Mother    Healthy Father    Asthma Maternal Grandfather    Congestive Heart Failure Maternal Grandfather     Social History   Tobacco Use   Smoking status: Former    Types: Cigarettes   Smokeless tobacco: Never   Tobacco comments:    "years ago"  Vaping Use   Vaping Use: Former   Devices: stopped with +UPT  Substance Use Topics   Alcohol use: Not Currently   Drug use: Not Currently    Types: Marijuana    Comment: August 2020    Allergies: No Known Allergies  Medications Prior to Admission  Medication Sig Dispense Refill Last Dose   albuterol (PROVENTIL) (2.5 MG/3ML) 0.083% nebulizer solution Take 3 mLs (2.5 mg total) by nebulization every 6 (six) hours as needed for wheezing or shortness of breath. Use if inhaler is out or not working to resolve wheezing 75 mL 4    albuterol (VENTOLIN HFA) 108 (90 Base) MCG/ACT inhaler Inhale 2 puffs into the lungs every 4 (four) hours as needed for wheezing or shortness of breath. 8 g 1    Blood Pressure Monitoring DEVI 1 each by Does not apply route once a week. (Patient not taking: Reported on 08/07/2021) 1 each 0    fluticasone  (FLOVENT HFA) 110 MCG/ACT inhaler Inhale 2 puffs into the lungs in the morning and at bedtime. 1 each 12    montelukast (SINGULAIR) 10 MG tablet Take 1 tablet (10 mg total) by mouth at bedtime. 30 tablet 11    Prenatal Vit-Fe Fumarate-FA (PRENATAL MULTIVITAMIN) TABS tablet Take 1 tablet by mouth daily at 12 noon.      prenatal vitamin w/FE, FA (PRENATAL 1 + 1) 27-1 MG TABS tablet Take 1 tablet by mouth daily at 12 noon. (Patient not taking: Reported on 08/22/2021) 30 tablet 11    valACYclovir (VALTREX) 500 MG tablet Take 500 mg by mouth 3 (three) times daily.       Review of Systems  Gastrointestinal:  Negative for abdominal pain.  Genitourinary:  Positive for pelvic pain. Negative for vaginal bleeding and vaginal discharge.  Physical Exam   Blood pressure 118/64, pulse (!) 102, temperature 98.3 F (36.8 C), temperature source Oral, resp. rate 18, height 5\' 3"  (1.6 m), weight 83.5  kg, last menstrual period 12/28/2020, SpO2 98 %, unknown if currently breastfeeding.  Physical Exam Vitals and nursing note reviewed.  Constitutional:      General: She is not in acute distress.    Appearance: Normal appearance.  HENT:     Head: Normocephalic and atraumatic.  Pulmonary:     Effort: Pulmonary effort is normal. No respiratory distress.  Musculoskeletal:        General: Normal range of motion.     Cervical back: Normal range of motion.  Neurological:     General: No focal deficit present.     Mental Status: She is alert and oriented to person, place, and time.  Psychiatric:        Mood and Affect: Mood normal.        Behavior: Behavior normal.  EFM: 140 bpm, mod variability, + accels, no decels Toco: UI  MAU Course  Procedures  MDM NST reactive. Pt reassured. Stable for discharge.  Assessment and Plan   1. NST (non-stress test) reactive   2. Supervision of other normal pregnancy, antepartum   3. [redacted] weeks gestation of pregnancy    Discharge home Follow up at [facility] and MFM as  scheduled [facility] PTL precautions  Allergies as of 09/04/2021   No Known Allergies      Medication List     STOP taking these medications    prenatal vitamin w/FE, FA 27-1 MG Tabs tablet       TAKE these medications    albuterol 108 (90 Base) MCG/ACT inhaler Commonly known as: VENTOLIN HFA Inhale 2 puffs into the lungs every 4 (four) hours as needed for wheezing or shortness of breath.   albuterol (2.5 MG/3ML) 0.083% nebulizer solution Commonly known as: PROVENTIL Take 3 mLs (2.5 mg total) by nebulization every 6 (six) hours as needed for wheezing or shortness of breath. Use if inhaler is out or not working to resolve wheezing   Blood Pressure Monitoring Devi 1 each by Does not apply route once a week.   fluticasone 110 MCG/ACT inhaler Commonly known as: Flovent HFA Inhale 2 puffs into the lungs in the morning and at bedtime.   montelukast 10 MG tablet Commonly known as: Singulair Take 1 tablet (10 mg total) by mouth at bedtime.   prenatal multivitamin Tabs tablet Take 1 tablet by mouth daily at 12 noon.   Valtrex 500 MG tablet Generic drug: valACYclovir Take 500 mg by mouth 3 (three) times daily.        Donette Larry, CNM 09/04/2021, 3:57 PM

## 2021-09-04 NOTE — Progress Notes (Signed)
MFM Note  Eileen Graham was seen for a growth scan and biophysical profile due to polyhydramnios noted during her prior exams.  She has screened negative for gestational diabetes in her current pregnancy.  The fetal growth measures appropriate for her gestational age (EFW 6 pounds 3 ounces, 58th percentile).  Polyhydramnios with a total AFI of 25.5 cm continues to be noted today.  A biophysical profile performed today was  6 out of 8.  She received a -2 for fetal breathing movements that did not meet criteria.  We then placed her on the fetal monitor for over an hour for an NST.    The NST was uninterpretable as a consistent fetal heart rate tracing could not be obtained due to vigorous fetal movements, making her total biophysical profile score 6 out of 10.  Due to the 6 out of 10 biophysical profile, the patient was sent to the hospital for prolonged monitoring.  Should she be discharged home, she already has another BPP scheduled in our office in 1 week.  A total of 20 minutes was spent counseling and coordinating the care for this patient.  Greater than 50% of the time was spent in direct face-to-face contact.

## 2021-09-04 NOTE — MAU Note (Signed)
Presents stating she was sent to MAU for monitoring.  Reports she's unsure why she needs monitoring, but may be induced.  Denies VB or LOF.  Endorses +FM.

## 2021-09-04 NOTE — Procedures (Signed)
Aidan Caloca 12-17-1995 [redacted]w[redacted]d  Fetus A Non-Stress Test Interpretation for 09/04/21  Indication: Unsatisfactory BPP  Fetal Heart Rate A Mode: External Baseline Rate (A): 140 bpm (difficult to trace FHR due to polyhydramnios and fetal movement) Variability: Moderate Accelerations: None Decelerations: None Multiple birth?: No  Uterine Activity Mode: Palpation, Toco Contraction Frequency (min): 2 uc's with ui Contraction Duration (sec): 40-80 Contraction Quality: Mild Resting Tone Palpated: Relaxed Resting Time: Adequate  Interpretation (Fetal Testing) Nonstress Test Interpretation:  (indeterminable due to unable to continuously trace FHR) Overall Impression:  (indeterminable due to unable to trace FHR continuously) Comments: Dr. Parke Poisson reviewed tracing. Patient sent to [facility] MAU for prolonged monitoring of FHR

## 2021-09-06 ENCOUNTER — Other Ambulatory Visit (HOSPITAL_COMMUNITY)
Admission: RE | Admit: 2021-09-06 | Discharge: 2021-09-06 | Disposition: A | Discharge status: Home / Self Care | Payer: Medicaid Other | Source: Ambulatory Visit | Attending: Family Medicine | Admitting: Family Medicine

## 2021-09-06 ENCOUNTER — Other Ambulatory Visit: Payer: Self-pay

## 2021-09-06 ENCOUNTER — Ambulatory Visit (INDEPENDENT_AMBULATORY_CARE_PROVIDER_SITE_OTHER): Payer: Medicaid Other | Admitting: Medical

## 2021-09-06 VITALS — BP 111/66 | HR 91 | Wt 181.3 lb

## 2021-09-06 DIAGNOSIS — Z3483 Encounter for supervision of other normal pregnancy, third trimester: Secondary | ICD-10-CM | POA: Insufficient documentation

## 2021-09-06 DIAGNOSIS — O403XX0 Polyhydramnios, third trimester, not applicable or unspecified: Secondary | ICD-10-CM

## 2021-09-06 DIAGNOSIS — Z3480 Encounter for supervision of other normal pregnancy, unspecified trimester: Secondary | ICD-10-CM

## 2021-09-06 DIAGNOSIS — Z3A36 36 weeks gestation of pregnancy: Secondary | ICD-10-CM

## 2021-09-06 DIAGNOSIS — Z3009 Encounter for other general counseling and advice on contraception: Secondary | ICD-10-CM

## 2021-09-06 NOTE — Progress Notes (Signed)
Patient complains of vaginal pain/pressure along with daily contractions. Patient stated that she does not time her contractions

## 2021-09-06 NOTE — Progress Notes (Signed)
   PRENATAL VISIT NOTE  Subjective:  Eileen Graham is a 25 y.o. [phone] at [redacted]w[redacted]d being seen today for ongoing prenatal care.  She is currently monitored for the following issues for this high-risk pregnancy and has Supervision of other normal pregnancy, antepartum; Asthma; Allergic rhinitis; Unwanted fertility; and Polyhydramnios affecting pregnancy in third trimester on their problem list.  Patient reports no complaints.  Contractions: Irritability. Vag. Bleeding: None.  Movement: Present. Denies leaking of fluid.   The following portions of the patient's history were reviewed and updated as appropriate: allergies, current medications, past family history, past medical history, past social history, past surgical history and problem list.   Objective:   Vitals:   09/06/21 1007  BP: 111/66  Pulse: 91  Weight: 181 lb 4.8 oz (82.2 kg)    Fetal Status: Fetal Heart Rate (bpm): 154 Fundal Height: 35 cm Movement: Present  Presentation: Vertex  General:  Alert, oriented and cooperative. Patient is in no acute distress.  Skin: Skin is warm and dry. No rash noted.   Cardiovascular: Normal heart rate noted  Respiratory: Normal respiratory effort, no problems with respiration noted  Abdomen: Soft, gravid, appropriate for gestational age.  Pain/Pressure: Present     Pelvic: Cervical exam performed in the presence of a chaperone Dilation: Closed Effacement (%): 50 Station: -3  Extremities: Normal range of motion.  Edema: None  Mental Status: Normal mood and affect. Normal behavior. Normal judgment and thought content.   Assessment and Plan:  Pregnancy: G4P1021 at [redacted]w[redacted]d 1. Supervision of other normal pregnancy, antepartum - GC/CT and GBS today  - Discussed Peds, patient has chosen provider, will give name   2. Polyhydramnios affecting pregnancy in third trimester - Weekly BPPs  3. [redacted] weeks gestation of pregnancy - Culture, beta strep (group b only) - GC/Chlamydia probe amp ([facility])not at [facility]  4. Unwanted fertility - Planning BTL  Term labor symptoms and general obstetric precautions including but not limited to vaginal bleeding, contractions, leaking of fluid and fetal movement were reviewed in detail with the patient. Please refer to After Visit Summary for other counseling recommendations.   Return in about 1 week (around 09/13/2021) for [facility] APP, In-Person.  Future Appointments  Date Time Provider Department Center  09/11/2021 10:35 AM Currie Paris, NP [facility]  09/11/2021 12:30 PM [facility] NURSE [facility]  09/11/2021 12:45 PM [facility] US5 [facility]  09/18/2021  1:30 PM [facility] NURSE [facility]  09/18/2021  1:45 PM [facility] US5 [facility]  09/25/2021  1:55 PM Allayne Stack, DO [facility]  09/25/2021  3:00 PM [facility] NURSE [facility]  09/25/2021  3:15 PM [facility] US2 [facility]    Vonzella Nipple, PA-C

## 2021-09-07 LAB — GC/CHLAMYDIA PROBE AMP (~~LOC~~) NOT AT ARMC
Chlamydia: NEGATIVE
Comment: NEGATIVE
Comment: NORMAL
Neisseria Gonorrhea: NEGATIVE

## 2021-09-09 LAB — CULTURE, BETA STREP (GROUP B ONLY): Strep Gp B Culture: NEGATIVE

## 2021-09-11 ENCOUNTER — Other Ambulatory Visit: Payer: Self-pay

## 2021-09-11 ENCOUNTER — Ambulatory Visit: Payer: Medicaid Other | Admitting: *Deleted

## 2021-09-11 ENCOUNTER — Ambulatory Visit (INDEPENDENT_AMBULATORY_CARE_PROVIDER_SITE_OTHER): Payer: Medicaid Other | Admitting: Nurse Practitioner

## 2021-09-11 ENCOUNTER — Ambulatory Visit: Payer: Medicaid Other | Attending: Obstetrics

## 2021-09-11 ENCOUNTER — Encounter: Payer: Self-pay | Admitting: *Deleted

## 2021-09-11 VITALS — BP 113/61 | HR 96

## 2021-09-11 VITALS — BP 130/79 | HR 105 | Wt 185.7 lb

## 2021-09-11 DIAGNOSIS — O403XX0 Polyhydramnios, third trimester, not applicable or unspecified: Secondary | ICD-10-CM | POA: Insufficient documentation

## 2021-09-11 DIAGNOSIS — Z3480 Encounter for supervision of other normal pregnancy, unspecified trimester: Secondary | ICD-10-CM | POA: Diagnosis present

## 2021-09-11 DIAGNOSIS — R0602 Shortness of breath: Secondary | ICD-10-CM

## 2021-09-11 NOTE — Progress Notes (Signed)
    Subjective:  Eileen Graham is a 25 y.o. [phone] at 108w5d being seen today for ongoing prenatal care.  She is currently monitored for the following issues for this high-risk pregnancy and has Supervision of other normal pregnancy, antepartum; Asthma; Allergic rhinitis; Unwanted fertility; and Polyhydramnios affecting pregnancy in third trimester on their problem list.  Patient reports  shortness of breath and dizziness when standing at work, unable to sleep at night - tossing and turning alot .  Contractions: Irritability. Vag. Bleeding: None.  Movement: Present. Denies leaking of fluid.   The following portions of the patient's history were reviewed and updated as appropriate: allergies, current medications, past family history, past medical history, past social history, past surgical history and problem list. Problem list updated.  Objective:   Vitals:   09/11/21 1051  BP: 130/79  Pulse: (!) 105  Weight: 185 lb 11.2 oz (84.2 kg)    Fetal Status: Fetal Heart Rate (bpm): 134 Fundal Height: 41 cm Movement: Present     General:  Alert, oriented and cooperative. Patient is in no acute distress.  Skin: Skin is warm and dry. No rash noted.   Cardiovascular: Normal heart rate noted  Respiratory: Normal respiratory effort, no problems with respiration noted  Abdomen: Soft, gravid, appropriate for gestational age. Pain/Pressure: Present     Pelvic:  Cervical exam deferred        Extremities: Normal range of motion.  Edema: None  Mental Status: Normal mood and affect. Normal behavior. Normal judgment and thought content.   Urinalysis:      Assessment and Plan:  Pregnancy: G4P1021 at [redacted]w[redacted]d  1. Supervision of other normal pregnancy, antepartum Still feeling bad - not sleeping, SOB, dizziness and pelvic pain Pubic symphysis tenderness noted and advised that it is normal at this time in pregnancy. Advised that poly is likely making her feel like she is very late term even though  she will shortly be 37 weeks States that MFM said she might need to be delivered at 37 weeks Will plan to see MD on Thursday so MFM results can be reviiewed and discuss a plan for her birth BP today higher than it has been usually but less than 140/90  2. Polyhydramnios affecting pregnancy in third trimester Being followed by MFM - has next appt after lunch today Went to hospital last week for extended monitoring  - BPP 6/10  3. Shortness of breath O2 sat 100% No visible respiratory distress Taking singulair and Flowvent - does not take albuterol due to increasing her heart rate  Term labor symptoms and general obstetric precautions including but not limited to vaginal bleeding, contractions, leaking of fluid and fetal movement were reviewed in detail with the patient. Please refer to After Visit Summary for other counseling recommendations.  Return in about 2 days (around 09/13/2021) for MD only [facility] - in person.  Nolene Bernheim, RN, MSN, NP-BC Nurse Practitioner, [facility] for [employer], [facility] 09/11/2021 11:38 AM

## 2021-09-11 NOTE — Progress Notes (Signed)
Patient stated that she is more uncomfortable and having more shortness of breath

## 2021-09-13 ENCOUNTER — Other Ambulatory Visit: Payer: Self-pay

## 2021-09-13 ENCOUNTER — Encounter: Payer: Self-pay | Admitting: Obstetrics & Gynecology

## 2021-09-13 ENCOUNTER — Ambulatory Visit (INDEPENDENT_AMBULATORY_CARE_PROVIDER_SITE_OTHER): Payer: Medicaid Other | Admitting: Obstetrics & Gynecology

## 2021-09-13 VITALS — BP 113/77 | HR 108 | Wt 184.6 lb

## 2021-09-13 DIAGNOSIS — Z3A37 37 weeks gestation of pregnancy: Secondary | ICD-10-CM

## 2021-09-13 DIAGNOSIS — O403XX0 Polyhydramnios, third trimester, not applicable or unspecified: Secondary | ICD-10-CM

## 2021-09-13 DIAGNOSIS — O0993 Supervision of high risk pregnancy, unspecified, third trimester: Secondary | ICD-10-CM

## 2021-09-13 NOTE — Patient Instructions (Signed)
Labor Induction ?Labor induction is when steps are taken to cause a pregnant woman to begin the labor process. Most women go into labor on their own between 37 weeks and 42 weeks of pregnancy. When this does not happen, or when there is a medical need for labor to begin, steps may be taken to induce, or bring on, labor. ?Labor induction causes a pregnant woman's uterus to contract. It also causes the cervix to soften (ripen), open (dilate), and thin out. Usually, labor is not induced before 39 weeks of pregnancy unless there is a medical reason to do so. ?When is labor induction considered? ?Labor induction may be right for you if: ?Your pregnancy lasts longer than 41 to 42 weeks. ?Your placenta is separating from your uterus (placental abruption). ?You have a rupture of membranes and your labor does not begin. ?You have health problems, like diabetes or high blood pressure (preeclampsia) during your pregnancy. ?Your baby has stopped growing or does not have enough amniotic fluid. ?Before labor induction begins, your health care provider will consider the following factors: ?Your medical condition and the baby's condition. ?How many weeks you have been pregnant. ?How mature the baby's lungs are. ?The condition of your cervix. ?The position of the baby. ?The size of your birth canal. ?Tell a health care provider about: ?Any allergies you have. ?All medicines you are taking, including vitamins, herbs, eye drops, creams, and over-the-counter medicines. ?Any problems you or your family members have had with anesthetic medicines. ?Any surgeries you have had. ?Any blood disorders you have. ?Any medical conditions you have. ?What are the risks? ?Generally, this is a safe procedure. However, problems may occur, including: ?Failed induction. ?Changes in fetal heart rate, such as being too high, too low, or irregular (erratic). ?Infection in the mother or the baby. ?Increased risk of having a cesarean delivery. ?Breaking off  (abruption) of the placenta from the uterus. This is rare. ?Rupture of the uterus. This is very rare. ?Your baby could fail to get enough blood flow or oxygen. This can be life-threatening. ?When induction is needed for medical reasons, the benefits generally outweigh the risks. ?What happens during the procedure? ?During the procedure, your health care provider will use one of these methods to induce labor: ?Stripping the membranes. In this method, the amniotic sac tissue is gently separated from the cervix. This causes the following to happen: ?Your cervix stretches, which in turn causes the release of prostaglandins. ?Prostaglandins induce labor and cause the uterus to contract. ?This procedure is often done in an office visit. You will be sent home to wait for contractions to begin. ?Prostaglandin medicine. This medicine starts contractions and causes the cervix to dilate and ripen. This can be taken by mouth (orally) or by being inserted into the vagina (suppository). ?Inserting a small, thin tube (catheter) with a balloon into the vagina and then expanding the balloon with water to dilate the cervix. ?Breaking the water. In this method, a small instrument is used to make a small hole in the amniotic sac. This eventually causes the amniotic sac to break. Contractions should begin within a few hours. ?Medicine to trigger or strengthen contractions. This medicine is given through an IV that is inserted into a vein in your arm. ?This procedure may vary among health care providers and hospitals. ?Where to find more information ?March of Dimes: www.marchofdimes.org ?The American College of Obstetricians and Gynecologists: www.acog.org ?Summary ?Labor induction causes a pregnant woman's uterus to contract. It also causes the cervix   to soften (ripen), open (dilate), and thin out. ?Labor is usually not induced before 39 weeks of pregnancy unless there is a medical reason to do so. ?When induction is needed for medical  reasons, the benefits generally outweigh the risks. ?Talk with your health care provider about which methods of labor induction are right for you. ?This information is not intended to replace advice given to you by your health care provider. Make sure you discuss any questions you have with your health care provider. ?Document Revised: 08/03/2020 Document Reviewed: 08/03/2020 ?Elsevier Patient Education ? 2022 [employer] ? ?

## 2021-09-13 NOTE — Progress Notes (Signed)
PRENATAL VISIT NOTE  Subjective:  Eileen Graham is a 25 y.o. [phone] at [redacted]w[redacted]d being seen today for ongoing prenatal care.  She is currently monitored for the following issues for this high-risk pregnancy and has Supervision of high-risk pregnancy; Asthma; Allergic rhinitis; Unwanted fertility; and Polyhydramnios affecting pregnancy in third trimester on their problem list.  Patient reports  very symptomatic and painful back pain. Desires delivery this week, this was option given to her by MFM due to symptomatic polyhydramnios .  Contractions: Irritability. Vag. Bleeding: None.  Movement: Present. Denies leaking of fluid.   The following portions of the patient's history were reviewed and updated as appropriate: allergies, current medications, past family history, past medical history, past social history, past surgical history and problem list.   Objective:   Vitals:   09/13/21 1008  BP: 113/77  Pulse: (!) 108  Weight: 184 lb 9.6 oz (83.7 kg)    Fetal Status: Fetal Heart Rate (bpm): 138   Movement: Present     General:  Alert, oriented and cooperative. Patient is in no acute distress.  Skin: Skin is warm and dry. No rash noted.   Cardiovascular: Normal heart rate noted  Respiratory: Normal respiratory effort, no problems with respiration noted  Abdomen: Soft, gravid, appropriate for gestational age.  Pain/Pressure: Present     Pelvic: Cervical exam deferred        Extremities: Normal range of motion.  Edema: Trace  Mental Status: Normal mood and affect. Normal behavior. Normal judgment and thought content.   Imaging: [name] MFM FETAL BPP W/NONSTRESS  Result Date: 09/04/2021 ----------------------------------------------------------------------  OBSTETRICS REPORT                       (Signed Final 09/04/2021 03:37 pm) ---------------------------------------------------------------------- Patient Info  ID #:       [id number]                          D.O.B.:  11-09-1995 (25 yrs)   Name:       Eileen Graham              Visit Date: 09/04/2021 12:37 pm ---------------------------------------------------------------------- Performed By  Attending:        Ma Rings MD         Ref. Address:     [address]                                                             [place], [place]                                                             [address]  Performed By:  Fayne Norrie BS,      [facility]                    RD[facility], RVT                                [facility]  Referred By:      Venora Maples MD ---------------------------------------------------------------------- Orders  #  Description                           Code        Ordered By  1  [name] MFM OB FOLLOW UP                   76816.01    YU FANG  2  [name] MFM FETAL BPP                      87579.7     Rosana Hoes     W/NONSTRESS ----------------------------------------------------------------------  #  Order #                     Accession #                Episode #  1  [id number]                   [id number]                 [id number]  2  [id number]                   [id number]                 [id number] ---------------------------------------------------------------------- Indications  Polyhydramnios, third trimester, antepartum    O40.3XX0  condition or complication, unspecified fetus  Echogenic intracardiac focus of the heart      O35.8XX0  (EIF)  Asthma                                         O99.89 j45.909  LR NIPS, Neg Horizon, Neg AFP  [redacted] weeks gestation of pregnancy                Z3A.35 ---------------------------------------------------------------------- Fetal Evaluation  Num Of Fetuses:         1  Fetal Heart Rate(bpm):  140  Cardiac Activity:  Observed   Presentation:           Cephalic  Placenta:               Anterior  P. Cord Insertion:      Visualized, central  Amniotic Fluid  AFI FV:      Polyhydramnios  AFI Sum(cm)     %Tile       Largest Pocket(cm)  25.5            95          8.2  RUQ(cm)       RLQ(cm)       LUQ(cm)        LLQ(cm)  4.5           5.8           7.1            8.2 ---------------------------------------------------------------------- Biophysical Evaluation  Amniotic F.V:   Pocket => 2 cm             F. Tone:        Observed  F. Movement:    Observed                   N.S.T:          Nonreactive  F. Breathing:   Not Observed               Score:          6/10 ---------------------------------------------------------------------- Biometry  BPD:      93.5  mm     G. Age:  38w 0d         97  %    CI:        76.57   %    70 - 86                                                          FL/HC:      19.4   %    20.1 - 22.1  HC:      338.5  mm     G. Age:  38w 6d         89  %    HC/AC:      1.06        0.93 - 1.11  AC:       320   mm     G. Age:  35w 6d         65  %    FL/BPD:     70.3   %    71 - 87  FL:       65.7  mm     G. Age:  33w 6d          7  %    FL/AC:      20.5   %    20 - 24  LV:        7.2  mm  Est. FW:    2800  gm      6 lb 3 oz     56  % ---------------------------------------------------------------------- OB History  Gravidity:    4         Term:   1  TOP:  2        Living:  1 ---------------------------------------------------------------------- Gestational Age  LMP:           35w 5d        Date:  12/28/20                 EDD:   10/04/21  U/S Today:     36w 5d                                        EDD:   09/27/21  Best:          35w 5d     Det. By:  LMP  (12/28/20)          EDD:   10/04/21 ---------------------------------------------------------------------- Anatomy  Cranium:               Appears normal         Aortic Arch:            Previously seen  Cavum:                 Appears normal         Ductal Arch:             Previously seen  Ventricles:            Appears normal         Diaphragm:              Appears normal  Choroid Plexus:        Previously seen        Stomach:                Appears normal, left                                                                        sided  Cerebellum:            Previously seen        Abdomen:                Appears normal  Posterior Fossa:       Previously seen        Abdominal Wall:         Appears nml (cord                                                                        insert, abd wall)  Nuchal Fold:           Previously seen        Cord Vessels:           Appears normal (3  vessel cord)  Face:                  Appears normal         Kidneys:                Appear normal                         (orbits and profile)  Lips:                  Previously seen        Bladder:                Appears normal  Thoracic:              Appears normal         Spine:                  Previously seen  Heart:                 Appears normal; EIF    Upper Extremities:      Previously seen  RVOT:                  Previously seen        Lower Extremities:      Previously seen  LVOT:                  Previously seen  Other:  Female gender previously visualized. Heels and 5th digit  previously          visualized. VC, 3VV and 3VTV previously visualized. Nasal bone and          lenses visualized. ---------------------------------------------------------------------- Cervix Uterus Adnexa  Cervix  Not visualized (advanced GA >24wks) ---------------------------------------------------------------------- Comments  Eileen Graham was seen for a growth scan and  biophysical profile due to polyhydramnios noted during her  prior exams.  She has screened negative for gestational  diabetes in her current pregnancy.  The fetal growth measures appropriate for her gestational  age (EFW 6 pounds 3 ounces, 58th percentile).  Polyhydramnios  with a total AFI of 25.5 cm continues to be  noted today.  A biophysical profile performed today was  6 out of 8.  She  received a -2 for fetal breathing movements that did not meet  criteria.  We then placed her on the fetal monitor for over an  hour for an NST.  The NST was uninterpretable as a consistent fetal heart rate  tracing could not be obtained due to vigorous fetal  movements, making her total biophysical profile score 6 out  of 10.  Due to the 6 out of 10 biophysical profile, the patient was sent  to the hospital for prolonged monitoring.  Should she be discharged home, she already has another  BPP scheduled in our office in 1 week.  A total of 20 minutes was spent counseling and coordinating  the care for this patient.  Greater than 50% of the time was  spent in direct face-to-face contact. ----------------------------------------------------------------------                   Ma Rings, MD Electronically Signed Final Report   09/04/2021 03:37 pm ----------------------------------------------------------------------  [name] MFM FETAL BPP WO NON STRESS  Result Date: 09/11/2021 ----------------------------------------------------------------------  OBSTETRICS REPORT                       (  Signed Final 09/11/2021 04:15 pm) ---------------------------------------------------------------------- Patient Info  ID #:       [id number]                          D.O.B.:  [birth year] (25 yrs)  Name:       Eileen Graham              Visit Date: 09/11/2021 01:01 pm ---------------------------------------------------------------------- Performed By  Attending:        Ma Rings MD         Ref. Address:     [address]                                                             [place], [place]                                                             [address]  Performed By:     Eden Lathe BS      [facility]                     RD[facility] RVT                                 [facility]  Referred By:      Venora Maples MD ---------------------------------------------------------------------- Orders  #  Description                           Code        Ordered By  1  [name] MFM FETAL BPP WO NON               [phone]  Rosana Hoes     STRESS ----------------------------------------------------------------------  #  Order #                     Accession #                Episode #  1  [id number]                   [id number]                 [id number] ---------------------------------------------------------------------- Indications  Polyhydramnios, third trimester, antepartum    O40.3XX0  condition or complication, unspecified fetus  [redacted] weeks gestation of pregnancy                Z3A.36  Echogenic intracardiac focus of the heart      O35.8XX0  (EIF)  Asthma                                         O99.89 j45.909  LR NIPS, Neg Horizon, Neg AFP ---------------------------------------------------------------------- Fetal Evaluation  Num Of Fetuses:         1  Fetal Heart Rate(bpm):  145  Cardiac Activity:       Observed  Presentation:           Cephalic  Amniotic Fluid  AFI FV:      Polyhydramnios  AFI Sum(cm)     %Tile       Largest Pocket(cm)  27.8            > 97        9.9  RUQ(cm)       RLQ(cm)       LUQ(cm)        LLQ(cm)  9.9           5.8           6.1            6 ---------------------------------------------------------------------- Biophysical Evaluation  Amniotic F.V:   Polyhydramnios             F. Tone:        Observed  F. Movement:    Observed                   Score:          8/8  F. Breathing:   Observed ---------------------------------------------------------------------- OB History  Gravidity:    4         Term:   1  TOP:          2        Living:  1  ---------------------------------------------------------------------- Gestational Age  LMP:           36w 5d        Date:  12/28/20                 EDD:   10/04/21  Best:          36w 5d     Det. By:  LMP  (12/28/20)          EDD:   10/04/21 ---------------------------------------------------------------------- Comments  This patient was seen for a biophysical profile due to  polyhydramnios noted on her prior exams.  The patient  reports that she continues to experience symptoms related to  polyhydramnios such as shortness of breath and difficulty  with ambulating.  A  biophysical profile performed today was 8 out of 8.  Polyhydramnios with a total AFI of 27.8 cm continues to be  noted today.  Should the patient continue to experience symptoms related  to polyhydramnios that is preventing her from performing  daily activities, delivery may be considered at any time at 37  weeks or greater.  Another BPP was scheduled in 1 week. ----------------------------------------------------------------------                   Ma Rings, MD Electronically Signed Final Report   09/11/2021 04:15 pm ----------------------------------------------------------------------  [name] MFM FETAL BPP WO NON STRESS  Result Date: 08/28/2021 ----------------------------------------------------------------------  OBSTETRICS REPORT                       (Signed Final 08/28/2021 01:40 pm) ---------------------------------------------------------------------- Patient Info  ID #:       [id number]                          D.O.B.:  1996-06-18 (25 yrs)  Name:       Eileen Graham              Visit Date: 08/28/2021 01:09 pm ---------------------------------------------------------------------- Performed By  Attending:        Ma Rings MD         Ref. Address:     [address]                                                             [place], [place]                                                              [address]  Performed By:     Clayton Lefort RDMS       Location[facility]                                                             [facility]  Referred By:      Venora Maples MD ---------------------------------------------------------------------- Orders  #  Description                           Code        Ordered By  1  [name] MFM FETAL BPP WO NON               16109.60    YU FANG     STRESS ----------------------------------------------------------------------  #  Order #                     Accession #                Episode #  1  [id number]                   [id number]                 [id number] ---------------------------------------------------------------------- Indications  Polyhydramnios, third trimester, antepartum    O40.3XX0  condition or complication, unspecified fetus  [redacted] weeks gestation of pregnancy                Z3A.34  Echogenic intracardiac focus of the heart      O35.8XX0  (EIF)  Asthma                                         O99.89 j45.909  LR NIPS, Neg Horizon, Neg AFP ---------------------------------------------------------------------- Fetal Evaluation  Num Of Fetuses:         1  Fetal Heart Rate(bpm):  140  Cardiac Activity:       Observed  Presentation:           Cephalic  Placenta:               Anterior  P. Cord Insertion:      Previously Visualized  Amniotic Fluid  AFI FV:      Polyhydramnios  AFI Sum(cm)     %Tile       Largest Pocket(cm)  28.02           > 97        10.55  RUQ(cm)       RLQ(cm)       LUQ(cm)        LLQ(cm)  10.55         7.65          4.91           4.91 ---------------------------------------------------------------------- Biophysical Evaluation  Amniotic F.V:   Pocket => 2 cm             F. Tone:        Observed  F. Movement:    Observed                    Score:          8/8  F. Breathing:   Observed ---------------------------------------------------------------------- Biometry  LV:        4.8  mm ---------------------------------------------------------------------- OB History  Gravidity:    4         Term:   1  TOP:  2        Living:  1 ---------------------------------------------------------------------- Gestational Age  LMP:           34w 5d        Date:  12/28/20                 EDD:   10/04/21  Best:          34w 5d     Det. By:  LMP  (12/28/20)          EDD:   10/04/21 ---------------------------------------------------------------------- Anatomy  Ventricles:            Appears normal         Kidneys:                Appear normal  Heart:                 Appears normal         Bladder:                Appears normal                         (4CH, axis, and                         situs)  Stomach:               Appears normal, left                         sided ---------------------------------------------------------------------- Cervix Uterus Adnexa  Cervix  Not visualized (advanced GA >24wks)  Right Ovary  Visualized.  Left Ovary  Visualized. ---------------------------------------------------------------------- Comments  This patient was seen for a biophysical profile due to  polyhydramnios noted during her prior exams.  She has  screened negative for gestational diabetes in her current  pregnancy.  Polyhydramnios with a total AFI of 28 cm was noted today.  A biophysical profile performed today was 8 out of 8.  Due to polyhydramnios, we will continue to follow her with  weekly fetal testing.  The increased risk of PPROM and preterm labor associated  with polyhydramnios was discussed.  Should her symptoms worsen due to polyhydramnios,  delivery may be considered at any time at 37 weeks or  greater.  Another BPP and growth scan was scheduled in 1 week. ----------------------------------------------------------------------                   Ma Rings, MD Electronically Signed Final Report   08/28/2021 01:40 pm ----------------------------------------------------------------------  [name] MFM FETAL BPP WO NON STRESS  Result Date: 08/22/2021 ----------------------------------------------------------------------  OBSTETRICS REPORT                       (Signed Final 08/22/2021 12:51 pm) ---------------------------------------------------------------------- Patient Info  ID #:       [id number]                          D.O.B.:  [birth year] (25 yrs)  Name:       Eileen Graham              Visit Date: 08/22/2021 12:02 pm ---------------------------------------------------------------------- Performed By  Attending:        Ma Rings MD         Ref. Address:     [address]                                                             [place], [place]                                                             [address]  Performed By:     Emeline Darling BS,      Location[facility]                    RDMS                                     [facility]  Referred By:      Venora Maples MD ---------------------------------------------------------------------- Orders  #  Description                           Code        Ordered By  1  [name] MFM FETAL BPP WO NON               95284.13    YU FANG     STRESS ----------------------------------------------------------------------  #  Order #                     Accession #                Episode #  1  [id number]                   [id number]                 [id number] ---------------------------------------------------------------------- Indications  Polyhydramnios, third trimester, antepartum    O40.3XX0  condition or complication, unspecified fetus  [redacted] weeks gestation of  pregnancy                Z3A.33  Echogenic intracardiac focus of the heart      O35.8XX0  (EIF)  Asthma                                         O99.89 j45.909  LR NIPS, Neg Horizon, Neg AFP ---------------------------------------------------------------------- Fetal Evaluation  Num Of Fetuses:         1  Fetal Heart Rate(bpm):  140  Cardiac Activity:       Observed  Presentation:           Cephalic  Placenta:               Anterior  P. Cord Insertion:      Previously Visualized  Amniotic Fluid  AFI FV:      Polyhydramnios  AFI Sum(cm)     %Tile       Largest Pocket(cm)  34.2            > 97        11.4  RUQ(cm)       RLQ(cm)       LUQ(cm)        LLQ(cm)  11.4          5.3           9.9            7.6 ---------------------------------------------------------------------- Biophysical Evaluation  Amniotic F.V:   Pocket => 2 cm             F. Tone:        Observed  F. Movement:    Observed                   Score:          8/8  F. Breathing:   Observed ---------------------------------------------------------------------- OB History  Gravidity:    4         Term:   1  TOP:          2        Living:  1 ---------------------------------------------------------------------- Gestational Age  LMP:           33w 6d        Date:  12/28/20                 EDD:   10/04/21  Best:          33w 6d     Det. By:  LMP  (12/28/20)          EDD:   10/04/21 ---------------------------------------------------------------------- Anatomy  Stomach:               Appears normal, left   Bladder:                Appears normal                         sided ---------------------------------------------------------------------- Comments  This patient was seen for a biophysical profile due to  polyhydramnios noted during her prior exams.  She has  screened negative for gestational diabetes in her current  pregnancy.  Polyhydramnios with a total AFI of over 30 cm was noted  today.  A biophysical profile performed today was  8 out of 8.  Due to  polyhydramnios, we will continue to follow her with  weekly fetal testing.  The increased risk of PPROM and preterm labor associated  with polyhydramnios was discussed.  The patient is only mildly symptomatic due to  polyhydramnios.  Should her symptoms worsen due to  polyhydramnios, delivery may be considered at any time at  37 weeks or greater.  Another BPP was scheduled in 1 week. ----------------------------------------------------------------------                   Ma Rings, MD Electronically Signed Final Report   08/22/2021 12:51 pm ----------------------------------------------------------------------  [name] MFM FETAL BPP WO NON STRESS  Result Date: 08/15/2021 ----------------------------------------------------------------------  OBSTETRICS REPORT                        (Signed Final 08/15/2021 02:10 am) ---------------------------------------------------------------------- Patient Info  ID #:       [id number]                          D.O.B.:  [birth year] (25 yrs)  Name:       Eileen Graham                  Visit Date: 08/14/2021 01:05 pm ---------------------------------------------------------------------- Performed By  Attending:        Lin Landsman      Ref. Address:      [address]                    MD                                                              [address]                                                              [place], [place]                                                              [address]  Performed By:     Clayton Lefort RDMS       Location:          [facility]                                                              [facility]  Referred By:      Venora Maples MD ---------------------------------------------------------------------- Orders  #  Description                            Code        Ordered By  1  [name] MFM FETAL BPP WO NON               19147.82    YU FANG     STRESS ----------------------------------------------------------------------  #  Order #                     Accession #                Episode #  1  [id number]                   [id number]                 [id number] ---------------------------------------------------------------------- Indications  [redacted] weeks gestation of pregnancy                 Z3A.32  Echogenic intracardiac focus of the heart       O35.8XX0  (EIF)  Asthma                                          O99.89 j45.909  LR NIPS, Neg Horizon, Neg AFP ---------------------------------------------------------------------- Fetal Evaluation  Num Of Fetuses:          1  Fetal Heart Rate(bpm):   141  Cardiac Activity:        Observed  Presentation:            Cephalic  Placenta:                Anterior  P. Cord Insertion:       Previously Visualized  Amniotic Fluid  AFI FV:      Subjectively increased  AFI Sum(cm)     %Tile       Largest Pocket(cm)  22.13           85          8.67  RUQ(cm)       RLQ(cm)       LUQ(cm)        LLQ(cm)  2.96          8.67          5.25           5.25 ---------------------------------------------------------------------- Biometry  LV:        3.4  mm ---------------------------------------------------------------------- OB History  Gravidity:    4         Term:   1  TOP:          2         Living: 1 ---------------------------------------------------------------------- Gestational Age  LMP:  32w 5d        Date:  12/28/20                 EDD:   10/04/21  Best:          Eileen Graham 5d     Det. By:  LMP  (12/28/20)          EDD:   10/04/21 ---------------------------------------------------------------------- Anatomy  Ventricles:            Appears normal         Kidneys:                Appear normal  Heart:                 Appears normal         Bladder:                Appears normal                         (4CH, axis, and                          situs)  Stomach:               Appears normal, left                         sided ---------------------------------------------------------------------- Cervix Uterus Adnexa  Cervix  Not visualized (advanced GA >24wks)  Right Ovary  Visualized.  Left Ovary  Visualized. ---------------------------------------------------------------------- Impression  Antenatal testing  The biophysical profile was 8/8 with good fetal movement and  amniotic fluid volume. ---------------------------------------------------------------------- Recommendations  Follow up as clinically indicated. ----------------------------------------------------------------------               Lin Landsman, MD Electronically Signed Final Report   08/15/2021 02:10 am ----------------------------------------------------------------------  [name] MFM OB FOLLOW UP  Result Date: 09/04/2021 ----------------------------------------------------------------------  OBSTETRICS REPORT                       (Signed Final 09/04/2021 03:37 pm) ---------------------------------------------------------------------- Patient Info  ID #:       [id number]                          D.O.B.:  Sep 18, 1996 (25 yrs)  Name:       Eileen Graham              Visit Date: 09/04/2021 12:37 pm ---------------------------------------------------------------------- Performed By  Attending:        Ma Rings MD         Ref. Address:     [address]  [place], [place]                                                             [address]  Performed By:     Fayne Norrie BS,      [facility], [facility]                                [facility]  Referred By:      Venora Maples MD ---------------------------------------------------------------------- Orders  #  Description                           Code        Ordered By  1  [name] MFM OB FOLLOW UP                   76816.01    YU FANG  2  [name] MFM FETAL BPP                      19147.8     Rosana Hoes     W/NONSTRESS ----------------------------------------------------------------------  #  Order #                     Accession #                Episode #  1  [id number]                   [id number]                 [id number]  2  [id number]                   [id number]                 [id number] ---------------------------------------------------------------------- Indications  Polyhydramnios, third trimester, antepartum    O40.3XX0  condition or complication, unspecified fetus  Echogenic intracardiac focus of the heart      O35.8XX0  (EIF)  Asthma  O99.89 j45.909  LR NIPS, Neg Horizon, Neg AFP  [redacted] weeks gestation of pregnancy                Z3A.35 ---------------------------------------------------------------------- Fetal Evaluation  Num Of Fetuses:         1  Fetal Heart Rate(bpm):  140  Cardiac Activity:       Observed  Presentation:           Cephalic  Placenta:               Anterior  P. Cord Insertion:      Visualized, central  Amniotic Fluid  AFI FV:      Polyhydramnios  AFI Sum(cm)     %Tile       Largest Pocket(cm)  25.5            95          8.2  RUQ(cm)       RLQ(cm)       LUQ(cm)        LLQ(cm)  4.5           5.8           7.1            8.2 ---------------------------------------------------------------------- Biophysical Evaluation  Amniotic F.V:   Pocket => 2 cm             F. Tone:        Observed  F. Movement:    Observed                   N.S.T:          Nonreactive  F. Breathing:   Not Observed               Score:          6/10 ---------------------------------------------------------------------- Biometry  BPD:      93.5  mm     G. Age:  38w 0d         97  %    CI:         76.57   %    70 - 86                                                          FL/HC:      19.4   %    20.1 - 22.1  HC:      338.5  mm     G. Age:  38w 6d         89  %    HC/AC:      1.06        0.93 - 1.11  AC:       320   mm     G. Age:  35w 6d         65  %    FL/BPD:     70.3   %    71 - 87  FL:       65.7  mm     G. Age:  33w 6d          7  %    FL/AC:      20.5   %    20 - 24  LV:  7.2  mm  Est. FW:    2800  gm      6 lb 3 oz     56  % ---------------------------------------------------------------------- OB History  Gravidity:    4         Term:   1  TOP:          2        Living:  1 ---------------------------------------------------------------------- Gestational Age  LMP:           35w 5d        Date:  12/28/20                 EDD:   10/04/21  U/S Today:     36w 5d                                        EDD:   09/27/21  Best:          35w 5d     Det. By:  LMP  (12/28/20)          EDD:   10/04/21 ---------------------------------------------------------------------- Anatomy  Cranium:               Appears normal         Aortic Arch:            Previously seen  Cavum:                 Appears normal         Ductal Arch:            Previously seen  Ventricles:            Appears normal         Diaphragm:              Appears normal  Choroid Plexus:        Previously seen        Stomach:                Appears normal, left                                                                        sided  Cerebellum:            Previously seen        Abdomen:                Appears normal  Posterior Fossa:       Previously seen        Abdominal Wall:         Appears nml (cord                                                                        insert, abd wall)  Nuchal Fold:           Previously  seen        Cord Vessels:           Appears normal (3                                                                        vessel cord)  Face:                  Appears normal         Kidneys:                 Appear normal                         (orbits and profile)  Lips:                  Previously seen        Bladder:                Appears normal  Thoracic:              Appears normal         Spine:                  Previously seen  Heart:                 Appears normal; EIF    Upper Extremities:      Previously seen  RVOT:                  Previously seen        Lower Extremities:      Previously seen  LVOT:                  Previously seen  Other:  Female gender previously visualized. Heels and 5th digit  previously          visualized. VC, 3VV and 3VTV previously visualized. Nasal bone and          lenses visualized. ---------------------------------------------------------------------- Cervix Uterus Adnexa  Cervix  Not visualized (advanced GA >24wks) ---------------------------------------------------------------------- Comments  Eileen Graham was seen for a growth scan and  biophysical profile due to polyhydramnios noted during her  prior exams.  She has screened negative for gestational  diabetes in her current pregnancy.  The fetal growth measures appropriate for her gestational  age (EFW 6 pounds 3 ounces, 58th percentile).  Polyhydramnios with a total AFI of 25.5 cm continues to be  noted today.  A biophysical profile performed today was  6 out of 8.  She  received a -2 for fetal breathing movements that did not meet  criteria.  We then placed her on the fetal monitor for over an  hour for an NST.  The NST was uninterpretable as a consistent fetal heart rate  tracing could not be obtained due to vigorous fetal  movements, making her total biophysical profile score 6 out  of 10.  Due to the 6 out of 10 biophysical profile, the patient was sent  to the hospital for prolonged monitoring.  Should she be discharged home, she already has another  BPP scheduled in our office in 1 week.  A  total of 20 minutes was spent counseling and coordinating  the care for this patient.  Greater than 50% of the time was   spent in direct face-to-face contact. ----------------------------------------------------------------------                   Ma Rings, MD Electronically Signed Final Report   09/04/2021 03:37 pm ----------------------------------------------------------------------   Assessment and Plan:  Pregnancy: [id number] at [redacted]w[redacted]d 1. Polyhydramnios affecting pregnancy in third trimester 2. [redacted] weeks gestation of pregnancy 3. Supervision of high risk pregnancy in third trimester IOL scheduled as per her request at [redacted]w[redacted]d on 09/18/21. Orders signed and held.  Term labor symptoms and general obstetric precautions including but not limited to vaginal bleeding, contractions, leaking of fluid and fetal movement were reviewed in detail with the patient. Please refer to After Visit Summary for other counseling recommendations.   Return for Postpartum check.  Future Appointments  Date Time Provider Department Center  09/18/2021  7:00 AM [facility] SCHED ROOM [facility] None  09/18/2021  1:30 PM [facility] NURSE [facility]  09/18/2021  1:45 PM [facility] US5 [facility]  09/25/2021  1:55 PM Allayne Stack, DO [facility]  09/25/2021  3:00 PM [facility] NURSE [facility]  09/25/2021  3:15 PM [facility] US2 [facility]    Jaynie Collins, MD

## 2021-09-16 ENCOUNTER — Other Ambulatory Visit: Payer: Self-pay | Admitting: Advanced Practice Midwife

## 2021-09-16 DIAGNOSIS — O403XX0 Polyhydramnios, third trimester, not applicable or unspecified: Secondary | ICD-10-CM

## 2021-09-18 ENCOUNTER — Inpatient Hospital Stay (HOSPITAL_COMMUNITY)
Admission: AD | Admit: 2021-09-18 | Discharge: 2021-09-20 | DRG: 807 | Disposition: A | Discharge status: Home / Self Care | Payer: Medicaid Other | Attending: Obstetrics and Gynecology | Admitting: Obstetrics and Gynecology

## 2021-09-18 ENCOUNTER — Ambulatory Visit: Payer: Medicaid Other

## 2021-09-18 ENCOUNTER — Inpatient Hospital Stay (HOSPITAL_COMMUNITY): Payer: Medicaid Other

## 2021-09-18 ENCOUNTER — Other Ambulatory Visit: Payer: Self-pay

## 2021-09-18 ENCOUNTER — Inpatient Hospital Stay (HOSPITAL_COMMUNITY): Payer: Medicaid Other | Admitting: Anesthesiology

## 2021-09-18 ENCOUNTER — Encounter (HOSPITAL_COMMUNITY): Payer: Self-pay | Admitting: Family Medicine

## 2021-09-18 DIAGNOSIS — Z87891 Personal history of nicotine dependence: Secondary | ICD-10-CM

## 2021-09-18 DIAGNOSIS — O9952 Diseases of the respiratory system complicating childbirth: Secondary | ICD-10-CM | POA: Diagnosis present

## 2021-09-18 DIAGNOSIS — Z3A37 37 weeks gestation of pregnancy: Secondary | ICD-10-CM

## 2021-09-18 DIAGNOSIS — Z30014 Encounter for initial prescription of intrauterine contraceptive device: Secondary | ICD-10-CM | POA: Diagnosis not present

## 2021-09-18 DIAGNOSIS — J45909 Unspecified asthma, uncomplicated: Secondary | ICD-10-CM | POA: Diagnosis present

## 2021-09-18 DIAGNOSIS — O0990 Supervision of high risk pregnancy, unspecified, unspecified trimester: Secondary | ICD-10-CM

## 2021-09-18 DIAGNOSIS — Z20822 Contact with and (suspected) exposure to covid-19: Secondary | ICD-10-CM | POA: Diagnosis present

## 2021-09-18 DIAGNOSIS — O403XX0 Polyhydramnios, third trimester, not applicable or unspecified: Principal | ICD-10-CM | POA: Diagnosis present

## 2021-09-18 DIAGNOSIS — Z30430 Encounter for insertion of intrauterine contraceptive device: Secondary | ICD-10-CM | POA: Diagnosis not present

## 2021-09-18 DIAGNOSIS — O0993 Supervision of high risk pregnancy, unspecified, third trimester: Secondary | ICD-10-CM

## 2021-09-18 DIAGNOSIS — Z3009 Encounter for other general counseling and advice on contraception: Secondary | ICD-10-CM | POA: Diagnosis present

## 2021-09-18 LAB — CBC
HCT: 34.5 % — ABNORMAL LOW (ref 36.0–46.0)
Hemoglobin: 11.5 g/dL — ABNORMAL LOW (ref 12.0–15.0)
MCH: 32.7 pg (ref 26.0–34.0)
MCHC: 33.3 g/dL (ref 30.0–36.0)
MCV: 98 fL (ref 80.0–100.0)
Platelets: 160 10*3/uL (ref 150–400)
RBC: 3.52 MIL/uL — ABNORMAL LOW (ref 3.87–5.11)
RDW: 13.2 % (ref 11.5–15.5)
WBC: 7.4 10*3/uL (ref 4.0–10.5)
nRBC: 0 % (ref 0.0–0.2)

## 2021-09-18 LAB — RESP PANEL BY RT-PCR (FLU A&B, COVID) ARPGX2
Influenza A by PCR: NEGATIVE
Influenza B by PCR: NEGATIVE
SARS Coronavirus 2 by RT PCR: NEGATIVE

## 2021-09-18 LAB — TYPE AND SCREEN
ABO/RH(D): O POS
Antibody Screen: NEGATIVE

## 2021-09-18 MED ORDER — TERBUTALINE SULFATE 1 MG/ML IJ SOLN: 0.2500 mg | Freq: Once | INTRAMUSCULAR | Status: DC | PRN

## 2021-09-18 MED ORDER — ZOLPIDEM TARTRATE 5 MG PO TABS: 5.0000 mg | ORAL_TABLET | Freq: Every evening | ORAL | Status: DC | PRN

## 2021-09-18 MED ORDER — OXYCODONE-ACETAMINOPHEN 5-325 MG PO TABS: 1.0000 | ORAL_TABLET | ORAL | Status: DC | PRN

## 2021-09-18 MED ORDER — OXYCODONE-ACETAMINOPHEN 5-325 MG PO TABS: 2.0000 | ORAL_TABLET | ORAL | Status: DC | PRN

## 2021-09-18 MED ORDER — LACTATED RINGERS IV SOLN: INTRAVENOUS | Status: DC

## 2021-09-18 MED ORDER — SOD CITRATE-CITRIC ACID 500-334 MG/5ML PO SOLN: 30.0000 mL | ORAL | Status: DC | PRN

## 2021-09-18 MED ORDER — PHENYLEPHRINE 40 MCG/ML (10ML) SYRINGE FOR IV PUSH (FOR BLOOD PRESSURE SUPPORT): 80.0000 ug | PREFILLED_SYRINGE | INTRAVENOUS | Status: DC | PRN

## 2021-09-18 MED ORDER — OXYTOCIN BOLUS FROM INFUSION
333.0000 mL | Freq: Once | INTRAVENOUS | Status: AC
  Administered 2021-09-19: 333 mL via INTRAVENOUS

## 2021-09-18 MED ORDER — OXYTOCIN-SODIUM CHLORIDE 30-0.9 UT/500ML-% IV SOLN
1.0000 m[IU]/min | INTRAVENOUS | Status: DC
  Administered 2021-09-18: 2 m[IU]/min via INTRAVENOUS
  Filled 2021-09-18: qty 500

## 2021-09-18 MED ORDER — ONDANSETRON HCL 4 MG/2ML IJ SOLN: 4.0000 mg | Freq: Four times a day (QID) | INTRAMUSCULAR | Status: DC | PRN

## 2021-09-18 MED ORDER — LACTATED RINGERS IV SOLN: 500.0000 mL | Freq: Once | INTRAVENOUS | Status: DC

## 2021-09-18 MED ORDER — MISOPROSTOL 25 MCG QUARTER TABLET
25.0000 ug | ORAL_TABLET | ORAL | Status: DC | PRN
  Administered 2021-09-18: 25 ug via VAGINAL
  Filled 2021-09-18 (×2): qty 1

## 2021-09-18 MED ORDER — MISOPROSTOL 50MCG HALF TABLET
50.0000 ug | ORAL_TABLET | ORAL | Status: DC
  Administered 2021-09-18: 50 ug via BUCCAL

## 2021-09-18 MED ORDER — DIPHENHYDRAMINE HCL 50 MG/ML IJ SOLN: 12.5000 mg | INTRAMUSCULAR | Status: DC | PRN

## 2021-09-18 MED ORDER — LIDOCAINE HCL (PF) 1 % IJ SOLN
INTRAMUSCULAR | Status: DC | PRN
  Administered 2021-09-18: 11 mL via EPIDURAL

## 2021-09-18 MED ORDER — FENTANYL CITRATE (PF) 100 MCG/2ML IJ SOLN
50.0000 ug | INTRAMUSCULAR | Status: DC | PRN
  Administered 2021-09-18: 50 ug via INTRAVENOUS
  Filled 2021-09-18: qty 2

## 2021-09-18 MED ORDER — LACTATED RINGERS IV SOLN
500.0000 mL | INTRAVENOUS | Status: DC | PRN
  Administered 2021-09-19: 500 mL via INTRAVENOUS

## 2021-09-18 MED ORDER — HYDROXYZINE HCL 50 MG PO TABS
50.0000 mg | ORAL_TABLET | Freq: Four times a day (QID) | ORAL | Status: DC | PRN
  Administered 2021-09-18: 50 mg via ORAL
  Filled 2021-09-18: qty 1

## 2021-09-18 MED ORDER — EPHEDRINE 5 MG/ML INJ: 10.0000 mg | INTRAVENOUS | Status: DC | PRN

## 2021-09-18 MED ORDER — FENTANYL-BUPIVACAINE-NACL 0.5-0.125-0.9 MG/250ML-% EP SOLN
12.0000 mL/h | EPIDURAL | Status: DC | PRN
  Administered 2021-09-18: 12 mL/h via EPIDURAL
  Filled 2021-09-18: qty 250

## 2021-09-18 MED ORDER — MISOPROSTOL 50MCG HALF TABLET
ORAL_TABLET | ORAL | Status: AC
  Filled 2021-09-18: qty 1

## 2021-09-18 MED ORDER — HYDROXYZINE HCL 25 MG PO TABS: 50.0000 mg | ORAL_TABLET | Freq: Once | ORAL | Status: DC

## 2021-09-18 MED ORDER — OXYTOCIN-SODIUM CHLORIDE 30-0.9 UT/500ML-% IV SOLN
2.5000 [IU]/h | INTRAVENOUS | Status: DC
  Administered 2021-09-19: 2.5 [IU]/h via INTRAVENOUS
  Filled 2021-09-18: qty 500

## 2021-09-18 MED ORDER — LIDOCAINE HCL (PF) 1 % IJ SOLN
30.0000 mL | INTRAMUSCULAR | Status: DC | PRN
  Filled 2021-09-18: qty 30

## 2021-09-18 MED ORDER — MONTELUKAST SODIUM 10 MG PO TABS
10.0000 mg | ORAL_TABLET | Freq: Every day | ORAL | Status: DC
  Administered 2021-09-18: 10 mg via ORAL
  Filled 2021-09-18 (×2): qty 1

## 2021-09-18 MED ORDER — ACETAMINOPHEN 325 MG PO TABS: 650.0000 mg | ORAL_TABLET | ORAL | Status: DC | PRN

## 2021-09-18 MED ORDER — FLEET ENEMA 7-19 GM/118ML RE ENEM: 1.0000 | ENEMA | Freq: Every day | RECTAL | Status: DC | PRN

## 2021-09-18 NOTE — Progress Notes (Signed)
Eileen Graham is a 25 y.o. [phone] at [redacted]w[redacted]d IOL for polyhydramnios in third trimester.    Subjective:  Patient feeling well overall watching TV on her computer. Reports +FM and denies vaginal bleeding, and denies LOF. Contractions getting more intense but still coping well for now. Discussed plan for cervical exam and foley balloon depending on dilation. Patient consents to foley balloon.    Objective: BP 115/61   Pulse 87   Temp 98.4 F (36.9 C) (Oral)   Resp 18   Ht 5\' 3"  (1.6 m)   Wt 83.1 kg   LMP 12/28/2020 (Exact Date)   BMI 32.47 kg/m    FHT:  FHR: 145 bpm, variability: moderate,  accelerations:  present,  decelerations:  none UC:   Q 1-5 minutes, mild  Dilation: 1.5 Effacement (%): 60 Cervical Position: Posterior Station: -3 Presentation: Vertex Exam by:: [email or web address] Student CNM IP Foley placed at [employer]: Results for orders placed or performed during the hospital encounter of 09/18/21 (from the past 24 hour(s))  CBC     Status: Abnormal   Collection Time: 09/18/21 11:17 AM  Result Value Ref Range   WBC 7.4 4.0 - 10.5 K/uL   RBC 3.52 (L) 3.87 - 5.11 MIL/uL   Hemoglobin 11.5 (L) 12.0 - 15.0 g/dL   HCT 09/20/21 (L) 45.4 - 09.8 %   MCV 98.0 80.0 - 100.0 fL   MCH 32.7 26.0 - 34.0 pg   MCHC 33.3 30.0 - 36.0 g/dL   RDW 11.9 14.7 - 82.9 %   Platelets 160 150 - 400 K/uL   nRBC 0.0 0.0 - 0.2 %  Type and screen     Status: None   Collection Time: 09/18/21 11:17 AM  Result Value Ref Range   ABO/RH(D) O POS    Antibody Screen NEG    Sample Expiration      09/21/2021,2359 Performed at [facility], [address] [place], [place]   Resp Panel by RT-PCR (Flu A&B, Covid) Nasopharyngeal Swab     Status: None   Collection Time: 09/18/21 11:37 AM   Specimen: Nasopharyngeal Swab; Nasopharyngeal(NP) swabs in vial transport medium  Result Value Ref Range   SARS Coronavirus 2 by RT PCR NEGATIVE NEGATIVE   Influenza A by PCR NEGATIVE NEGATIVE    Influenza B by PCR NEGATIVE NEGATIVE    Assessment / Plan: [redacted]w[redacted]d week IUP IOL for polyhydramnios progressing well on Cytotec. IP Foley placed with 37mL without difficulty.  Buccal Cytotec Recheck  cervix after foley balloon removed  Labor: 1st stage of labor Fetal Wellbeing:  Category 1 Pain Control:  Epidural when hurting more Anticipated MOD:  SVD  Lashaya, Kien[place], Student-MidWife 09/18/2021 4:11 PM

## 2021-09-18 NOTE — Anesthesia Procedure Notes (Signed)
Epidural Patient location during procedure: OB Start time: 09/18/2021 9:18 PM End time: 09/18/2021 9:35 PM  Staffing Anesthesiologist: Lowella Curb, MD Performed: anesthesiologist   Preanesthetic Checklist Completed: patient identified, IV checked, site marked, risks and benefits discussed, surgical consent, monitors and equipment checked, pre-op evaluation and timeout performed  Epidural Patient position: sitting Prep: ChloraPrep Patient monitoring: heart rate, cardiac monitor, continuous pulse ox and blood pressure Approach: midline Location: L2-L3 Injection technique: LOR saline  Needle:  Needle type: Tuohy  Needle gauge: 17 G Needle length: 9 cm Needle insertion depth: 6 cm Catheter type: closed end flexible Catheter size: 20 Guage Catheter at skin depth: 10 cm Test dose: negative  Assessment Events: blood not aspirated, injection not painful, no injection resistance, no paresthesia and negative IV test  Additional Notes Reason for block:procedure for pain

## 2021-09-18 NOTE — H&P (Addendum)
OB ADMISSION/ HISTORY & PHYSICAL:  Admission Date: 09/18/2021 10:51 AM  Admit Diagnosis: Polyhydramnios in third trimester complication, single or unspecified fetus [O40.3XX0]    Eileen Graham is a 25 y.o. female at [redacted]w[redacted]d presenting for IOL for polyhydramnios in third trimester. Patient reports + FM and denies vaginal bleeding and LOF. Mother on her way from [place] to be support during labor. Anticipating the arrival of baby girl.   Prenatal History: [id number]   EDC : 10/04/2021, by Last Menstrual Period  [facility]  Prenatal course complicated by: Asthma Allergic rhinitis  Polyhydramnios in third trimester     Maternal Ultrasounds/Referrals: Isolated EIF (echogenic intracardiac focus) Fetal Ultrasounds or other Referrals:  Referred to Materal Fetal Medicine  Maternal Substance Abuse:  No Significant Maternal Medications:  None Significant Maternal Lab Results:  Group B Strep negative Other Comments:  None          Nursing Staff Provider  Office Location  [facility] Dating  LMP  Language  English Anatomy US  WNL with EIF   Flu Vaccine  NA Genetic/Carrier Screen  NIPS: low risk  AFP:    Horizon: neg 4/4  TDaP Vaccine   08/07/21 Hgb A1C or  GTT Early  Third trimester neg  COVID Vaccine No   LAB RESULTS   Rhogam  NA Blood Type O/Positive/-- (05/18 1147)   Baby Feeding Plan Bottle Antibody Negative (05/18 1147)  Contraception BTL papers signed 08/07/21 Rubella 5.94 Immune (05/18 1147)  Circumcision Yes RPR Non Reactive (05/18 1147)   Pediatrician  Will bring name soon HBsAg Negative (05/18 1147)   Support Person Did not want to give name HCVAb Neg  Prenatal Classes   HIV Non Reactive (05/18 1147)     BTL Consent 08/07/21 GBS  Negative (11/3 1035)  VBAC Consent NA Pap             BP Cuff Oredered thru [facility]-03/06/21        Medical / Surgical History : Past medical history:  Past Medical History:  Diagnosis Date    Alopecia    Asthma    Headache    Infection    UTI   Shingles    Trichomonas infection     Past surgical history:  Past Surgical History:  Procedure Laterality Date   abortion     x2   NO PAST SURGERIES      Family History:  Family History  Problem Relation Age of Onset   Hypertension Mother    Healthy Father    Asthma Maternal Grandfather    Congestive Heart Failure Maternal Grandfather     Social History:  reports that she has quit smoking. Her smoking use included cigarettes. She has never used smokeless tobacco. She reports that she does not currently use alcohol. She reports that she does not currently use drugs after having used the following drugs: Marijuana. Allergies: Patient has no known allergies.   Current Medications at time of admission:  Medications Prior to Admission  Medication Sig Dispense Refill Last Dose   albuterol (PROVENTIL) (2.5 MG/3ML) 0.083% nebulizer solution Take 3 mLs (2.5 mg total) by nebulization every 6 (six) hours as needed for wheezing or shortness of breath. Use if inhaler is out or not working to resolve wheezing (Patient not taking: Reported on 09/11/2021) 75 mL 4    albuterol (VENTOLIN HFA) 108 (90 Base) MCG/ACT inhaler Inhale 2 puffs into the lungs every 4 (four) hours as needed  for wheezing or shortness of breath. (Patient not taking: Reported on 09/11/2021) 8 g 1    Blood Pressure Monitoring DEVI 1 each by Does not apply route once a week. (Patient not taking: No sig reported) 1 each 0    fluticasone (FLOVENT HFA) 110 MCG/ACT inhaler Inhale 2 puffs into the lungs in the morning and at bedtime. 1 each 12    montelukast (SINGULAIR) 10 MG tablet Take 1 tablet (10 mg total) by mouth at bedtime. 30 tablet 11    Prenatal Vit-Fe Fumarate-FA (PRENATAL MULTIVITAMIN) TABS tablet Take 1 tablet by mouth daily at 12 noon.      valACYclovir (VALTREX) 500 MG tablet Take 500 mg by mouth 3 (three) times daily. (Patient not taking: Reported on 09/11/2021)         Review of Systems: Review of Systems  Constitutional: Negative.   Eyes: Negative.   Respiratory: Negative.    Neurological: Negative.   Psychiatric/Behavioral: Negative.     Physical Exam: Vital signs and nursing notes reviewed.  Patient Vitals for the past 24 hrs:  BP Temp Temp src Pulse Resp Height Weight  09/18/21 1247 116/62 -- -- 91 18 -- --  09/18/21 1157 117/64 -- -- 98 18 -- --  09/18/21 1110 109/76 98.2 F (36.8 C) Oral (!) 132 18 5\' 3"  (1.6 m) 83.1 kg     General: AAO x 3, NAD Heart: RRR Lungs:CTAB Abdomen: Gravid, NT Extremities: no edema Genitalia / VE: Dilation: 1 Effacement (%): 50 Cervical Position: Posterior Station: -3 Presentation: Vertex Exam by:: Eileen Graham   FHR: 145 BPM, moderate variability, + accels, no decels TOCO: irreg contractions Q 2-6 mins  Labs:   Pending T&S, CBC, RPR  Recent Labs    09/18/21 1117  WBC 7.4  HGB 11.5*  HCT 34.5*  PLT 160    Assessment:  25 y.o. [id number] at [redacted]w[redacted]d here for IOL for polyhydramnios. History of Asthma taking prn albuterol and daily Singulair.   1. 1st stage of labor 2. FHR category 1 3. GBS Negative 4. Desires Epidural 5. Plans to Breastfeed  Plan:  1. Admit to BS 2. Routine L&D orders 3. Misoprostol 4. Foley balloon or pitocin 5. Analgesia/anesthesia PRN  6. Expectant management 7. Anticipate NSVB   Eileen Graham SNM 09/18/2021, 1:43 PM   Graham attestation:  I have seen and examined this patient; I agree with above documentation in the student nurse midwife's note.   Eileen Graham is a 25 y.o. [phone] here for IOL due to mild polydramnios.  PE: BP 124/63   Pulse 91   Temp 98.4 F (36.9 C) (Oral)   Resp 18   Ht 5\' 3"  (1.6 m)   Wt 83.1 kg   LMP 12/28/2020 (Exact Date)   BMI 32.47 kg/m  Gen: calm comfortable, NAD Resp: normal effort, no distress Abd: gravid  ROS, labs, PMH reviewed  Plan: Admit to Labor and Delivery Cx ripening with  cytotec/cervical foley to start, then Pit/AROM prn Anticipate vag del  Eileen Graham Graham 09/18/2021, 3:38 PM

## 2021-09-18 NOTE — Progress Notes (Signed)
Labor Progress Note Eileen Graham is a 25 y.o. [phone] at [redacted]w[redacted]d presented for IOL for mild poly  S:  Patient comfortable with epidural  O:  BP 128/62   Pulse 86   Temp 98 F (36.7 C) (Oral)   Resp 16   Ht 5\' 3"  (1.6 m)   Wt 83.1 kg   LMP 12/28/2020 (Exact Date)   SpO2 99%   BMI 32.47 kg/m   Fetal Tracing:  Baseline: 145 Variability: moderate Accels: 15x15 Decels:none  Toco: 1-3   CVE: Dilation: 4 Effacement (%): 60 Cervical Position: Posterior Station: Ballotable Presentation: Vertex Exam by:: [email or web address], CNM   A&P: 25 y.o. 34 [redacted]w[redacted]d IOL mild poly #Labor: Progressing well. Will start pitocin 2x2. Fetal head still ballotable, will attempt AROM when fetus moves down in station #Pain: epidural #FWB: Cat 1 #GBS negative  [redacted]w[redacted]d, CNM 10:15 PM

## 2021-09-18 NOTE — Anesthesia Preprocedure Evaluation (Signed)
Anesthesia Evaluation  Patient identified by MRN, date of birth, ID band Patient awake    Reviewed: Allergy & Precautions, NPO status , Patient's Chart, lab work & pertinent test results  Airway Mallampati: II  TM Distance: >3 FB Neck ROM: Full    Dental no notable dental hx.    Pulmonary asthma , former smoker,    Pulmonary exam normal breath sounds clear to auscultation       Cardiovascular negative cardio ROS Normal cardiovascular exam Rhythm:Regular Rate:Normal     Neuro/Psych  Headaches, negative psych ROS   GI/Hepatic negative GI ROS, Neg liver ROS,   Endo/Other  negative endocrine ROS  Renal/GU negative Renal ROS  negative genitourinary   Musculoskeletal negative musculoskeletal ROS (+)   Abdominal   Peds negative pediatric ROS (+)  Hematology negative hematology ROS (+)   Anesthesia Other Findings   Reproductive/Obstetrics (+) Pregnancy                             Anesthesia Physical Anesthesia Plan  ASA: 2  Anesthesia Plan: Epidural   Post-op Pain Management:    Induction: Intravenous  PONV Risk Score and Plan:   Airway Management Planned:   Additional Equipment:   Intra-op Plan:   Post-operative Plan:   Informed Consent: I have reviewed the patients History and Physical, chart, labs and discussed the procedure including the risks, benefits and alternatives for the proposed anesthesia with the patient or authorized representative who has indicated his/her understanding and acceptance.     Dental advisory given  Plan Discussed with: CRNA  Anesthesia Plan Comments:         Anesthesia Quick Evaluation

## 2021-09-19 DIAGNOSIS — Z30014 Encounter for initial prescription of intrauterine contraceptive device: Secondary | ICD-10-CM

## 2021-09-19 DIAGNOSIS — O403XX0 Polyhydramnios, third trimester, not applicable or unspecified: Secondary | ICD-10-CM

## 2021-09-19 DIAGNOSIS — Z3A37 37 weeks gestation of pregnancy: Secondary | ICD-10-CM

## 2021-09-19 LAB — RPR: RPR Ser Ql: NONREACTIVE

## 2021-09-19 MED ORDER — IBUPROFEN 600 MG PO TABS
600.0000 mg | ORAL_TABLET | Freq: Four times a day (QID) | ORAL | Status: DC
  Administered 2021-09-19 – 2021-09-20 (×3): 600 mg via ORAL
  Filled 2021-09-19 (×5): qty 1

## 2021-09-19 MED ORDER — LEVONORGESTREL 20.1 MCG/DAY IU IUD: 1.0000 | INTRAUTERINE_SYSTEM | Freq: Once | INTRAUTERINE | Status: AC

## 2021-09-19 MED ORDER — DIBUCAINE (PERIANAL) 1 % EX OINT: 1.0000 "application " | TOPICAL_OINTMENT | CUTANEOUS | Status: DC | PRN

## 2021-09-19 MED ORDER — ONDANSETRON HCL 4 MG PO TABS: 4.0000 mg | ORAL_TABLET | ORAL | Status: DC | PRN

## 2021-09-19 MED ORDER — LEVONORGESTREL 20.1 MCG/DAY IU IUD
INTRAUTERINE_SYSTEM | INTRAUTERINE | Status: AC
  Administered 2021-09-19: 1 via INTRAUTERINE
  Filled 2021-09-19: qty 1

## 2021-09-19 MED ORDER — DIPHENHYDRAMINE HCL 25 MG PO CAPS: 25.0000 mg | ORAL_CAPSULE | Freq: Four times a day (QID) | ORAL | Status: DC | PRN

## 2021-09-19 MED ORDER — MEASLES, MUMPS & RUBELLA VAC IJ SOLR: 0.5000 mL | Freq: Once | INTRAMUSCULAR | Status: DC

## 2021-09-19 MED ORDER — WITCH HAZEL-GLYCERIN EX PADS: 1.0000 "application " | MEDICATED_PAD | CUTANEOUS | Status: DC | PRN

## 2021-09-19 MED ORDER — ACETAMINOPHEN 325 MG PO TABS
650.0000 mg | ORAL_TABLET | ORAL | Status: DC | PRN
  Administered 2021-09-19 (×2): 650 mg via ORAL
  Filled 2021-09-19 (×2): qty 2

## 2021-09-19 MED ORDER — COCONUT OIL OIL: 1.0000 "application " | TOPICAL_OIL | Status: DC | PRN

## 2021-09-19 MED ORDER — PRENATAL MULTIVITAMIN CH
1.0000 | ORAL_TABLET | Freq: Every day | ORAL | Status: DC
  Administered 2021-09-20: 12:00:00 1 via ORAL
  Filled 2021-09-19: qty 1

## 2021-09-19 MED ORDER — BENZOCAINE-MENTHOL 20-0.5 % EX AERO
1.0000 "application " | INHALATION_SPRAY | CUTANEOUS | Status: DC | PRN
  Administered 2021-09-19: 1 via TOPICAL
  Filled 2021-09-19: qty 56

## 2021-09-19 MED ORDER — TETANUS-DIPHTH-ACELL PERTUSSIS 5-2.5-18.5 LF-MCG/0.5 IM SUSY: 0.5000 mL | PREFILLED_SYRINGE | Freq: Once | INTRAMUSCULAR | Status: DC

## 2021-09-19 MED ORDER — SENNOSIDES-DOCUSATE SODIUM 8.6-50 MG PO TABS
2.0000 | ORAL_TABLET | Freq: Every day | ORAL | Status: DC
  Administered 2021-09-20: 12:00:00 2 via ORAL
  Filled 2021-09-19: qty 2

## 2021-09-19 MED ORDER — SIMETHICONE 80 MG PO CHEW: 80.0000 mg | CHEWABLE_TABLET | ORAL | Status: DC | PRN

## 2021-09-19 MED ORDER — ONDANSETRON HCL 4 MG/2ML IJ SOLN: 4.0000 mg | INTRAMUSCULAR | Status: DC | PRN

## 2021-09-19 NOTE — Anesthesia Postprocedure Evaluation (Signed)
Anesthesia Post Note  Patient: Baird Cancer  Procedure(s) Performed: AN AD HOC LABOR EPIDURAL     Patient location during evaluation: Mother Baby Anesthesia Type: Epidural Level of consciousness: awake and alert Pain management: pain level controlled Vital Signs Assessment: post-procedure vital signs reviewed and stable Respiratory status: spontaneous breathing, nonlabored ventilation and respiratory function stable Cardiovascular status: stable Postop Assessment: no headache, no backache and epidural receding Anesthetic complications: no   No notable events documented.  Last Vitals:  Vitals:   09/19/21 1430 09/19/21 1530  BP: 119/74 112/66  Pulse: (!) 108 100  Resp: 16 18  Temp: 36.9 C 37 C  SpO2: 99% 100%    Last Pain:  Vitals:   09/19/21 1530  TempSrc: Oral  PainSc: 2    Pain Goal:                   [employer]

## 2021-09-19 NOTE — Progress Notes (Signed)
Labor Progress Note Eileen Graham is a 25 y.o. [phone] at [redacted]w[redacted]d presented for IOL due to mild polyhydramnios.   S: Doing well, waking up from a nap. Epidural in place. Not feeling any pressure.   O:  BP (!) 107/56   Pulse 81   Temp 98.1 F (36.7 C) (Oral)   Resp 15   Ht 5\' 3"  (1.6 m)   Wt 83.1 kg   LMP 12/28/2020 (Exact Date)   SpO2 99%   BMI 32.47 kg/m  EFM: 140/mod/15x15/occasional variable   CVE: Dilation: 5 Effacement (%): 70 Cervical Position: Posterior Station: Ballotable Presentation: Vertex Exam by:: Dr. [email or web address]   A&P: 25 y.o. 34 [redacted]w[redacted]d  #Labor: Some progression since last check with pit 2x2. Fetal head is still quite ballotable and not engaged into the pelvis quite yet. Placed patient in high fowlers and will recheck in the next few hours with the hopes of improvement, however will likely need to use FSE with slow AROM as anticipate station may not improve until ruptured.  #Pain: Epidural in place  #FWB: Cat 1, occasional II improved with position changes #GBS negative   [redacted]w[redacted]d, DO 4:08 AM

## 2021-09-19 NOTE — Progress Notes (Signed)
Labor Progress Note Eileen Graham is a 25 y.o. [id number] at 105w6d presented for IOL due to mild polyhydramnios.   S: Doing well.   O:  BP 118/70   Pulse 94   Temp 98.1 F (36.7 C) (Oral)   Resp 17   Ht 5\' 3"  (1.6 m)   Wt 83.1 kg   LMP 12/28/2020 (Exact Date)   SpO2 99%   BMI 32.47 kg/m  EFM: 140/mod/15x15/none  CVE: Dilation: 5 Effacement (%): 70 Cervical Position: Posterior Station: Ballotable Presentation: Vertex Exam by:: Dr. [email or web address]   A&P: 25 y.o. 34 [redacted]w[redacted]d  #Labor: Minimal change since last check. After verbal consent and risk discussion of possible cord prolapse with known polyhydramnios, performed AROM with FSE. Allowed for slow release of clear fluid, large amount retrieved. Cervix checked after AROM, head now well applied to the cervix throughout.  #Pain: PRN  #FWB: Cat 1  #GBS negative  [redacted]w[redacted]d, DO 5:35 AM

## 2021-09-19 NOTE — Discharge Summary (Signed)
Postpartum Discharge Summary     Patient Name: Eileen Graham DOB: 02/18/1996 MRN: [id number]  Date of admission: 09/18/2021 Delivery date:09/19/2021  Delivering provider:   Date of discharge: 09/20/2021  Admitting diagnosis: Polyhydramnios in third trimester complication, single or unspecified fetus [O40.3XX0] Intrauterine pregnancy: [redacted]w[redacted]d    Secondary diagnosis:  Active Problems:   Supervision of high-risk pregnancy   Asthma   Unwanted fertility   Polyhydramnios affecting pregnancy in third trimester   Vaginal delivery  Additional problems: None    Discharge diagnosis: Term Pregnancy Delivered                                              Post partum procedures: postplacental Liletta placed Augmentation: AROM, Pitocin, Cytotec, and IP Foley Complications: None  Hospital course: Induction of Labor With Vaginal Delivery   25y.o. yo [id number] 326w6das admitted to the hospital 09/18/2021 for induction of labor.  Indication for induction:  mild polyhydramnios .  Patient had an uncomplicated labor course as follows: Membrane Rupture Time/Date: 5:26 AM ,09/19/2021   Delivery Method:Vaginal, Spontaneous  Episiotomy: None  Lacerations:  Periurethral  Details of delivery can be found in separate delivery note. She had a Liletta placed postplacentally. Patient had a routine postpartum course. Patient is discharged home 09/20/21 per her request for early d/c as long as the baby can go as well.  Newborn Data: Birth date:09/19/2021  Birth time:12:01 PM  Gender:Female  Living status:Living  Apgars:7 ,9  Weight:2990 g (6lb 9.5oz)  Magnesium Sulfate received: No BMZ received: No Rhophylac:N/A MMR:N/A T-DaP:Given prenatally Flu: No Transfusion:No  Physical exam  Vitals:   09/19/21 1932 09/19/21 2336 09/20/21 0245 09/20/21 0510  BP: 125/75 121/72 106/65 110/69  Pulse: 95 93 81 90  Resp: 18 19 18 17   Temp: 98.9 F (37.2 C) 98.7 F (37.1 C) 97.6 F (36.4 C) 97.7  F (36.5 C)  TempSrc: Oral Oral Oral Oral  SpO2:  100% 96% 100%  Weight:      Height:       General: alert and cooperative Lochia: appropriate Uterine Fundus: firm Incision: N/A DVT Evaluation: No evidence of DVT seen on physical exam. Labs: Lab Results  Component Value Date   WBC 7.4 09/18/2021   HGB 11.5 (L) 09/18/2021   HCT 34.5 (L) 09/18/2021   MCV 98.0 09/18/2021   PLT 160 09/18/2021   CMP Latest Ref Rng & Units 02/06/2021  Glucose 70 - 99 mg/dL 85  BUN 6 - 20 mg/dL 9  Creatinine 0.44 - 1.00 mg/dL 0.67  Sodium 135 - 145 mmol/L 133(L)  Potassium 3.5 - 5.1 mmol/L 3.9  Chloride 98 - 111 mmol/L 103  CO2 22 - 32 mmol/L 25  Calcium 8.9 - 10.3 mg/dL 9.1  Total Protein 6.5 - 8.1 g/dL 6.9  Total Bilirubin 0.3 - 1.2 mg/dL 0.4  Alkaline Phos 38 - 126 U/L 34(L)  AST 15 - 41 U/L 15  ALT 0 - 44 U/L 12   Edinburgh Score: Edinburgh Postnatal Depression Scale Screening Tool 09/20/2021  I have been able to laugh and see the funny side of things. 0  I have looked forward with enjoyment to things. 0  I have blamed myself unnecessarily when things went wrong. 0  I have been anxious or worried for no good reason. 0  I have felt scared or  panicky for no good reason. 0  Things have been getting on top of me. 0  I have been so unhappy that I have had difficulty sleeping. 0  I have felt sad or miserable. 0  I have been so unhappy that I have been crying. 0  The thought of harming myself has occurred to me. 0  Edinburgh Postnatal Depression Scale Total 0     After visit meds:  Allergies as of 09/20/2021   No Known Allergies      Medication List     STOP taking these medications    albuterol (2.5 MG/3ML) 0.083% nebulizer solution Commonly known as: PROVENTIL   albuterol 108 (90 Base) MCG/ACT inhaler Commonly known as: VENTOLIN HFA   Blood Pressure Monitoring Devi   valACYclovir 500 MG tablet Commonly known as: VALTREX       TAKE these medications    fluticasone  110 MCG/ACT inhaler Commonly known as: Flovent HFA Inhale 2 puffs into the lungs in the morning and at bedtime.   ibuprofen 600 MG tablet Commonly known as: ADVIL Take 1 tablet (600 mg total) by mouth every 6 (six) hours as needed for cramping.   montelukast 10 MG tablet Commonly known as: Singulair Take 1 tablet (10 mg total) by mouth at bedtime.   prenatal multivitamin Tabs tablet Take 1 tablet by mouth daily at 12 noon.         Discharge home in stable condition Infant Feeding: Bottle Infant Disposition:home with mother Discharge instruction: per After Visit Summary and Postpartum booklet. Activity: Advance as tolerated. Pelvic rest for 6 weeks.  Diet: routine diet Future Appointments: Future Appointments  Date Time Provider Bennett  10/24/2021 10:35 AM Patriciaann Clan, DO [facility]   Follow up Visit: Message sent to [facility] by Dr. Cy Blamer on 11/16  Please schedule this patient for a In person postpartum visit in 4 weeks with the following provider: Any provider. Additional Postpartum F/U: IUD string check at 4-6 weeks   High risk pregnancy complicated by:  polyhydramnios (mild) Delivery mode:  Vaginal, Spontaneous  Anticipated Birth Control:  PP IUD placed(liletta IUD placed)   09/20/2021 Myrtis Ser, CNM 8:44 AM

## 2021-09-20 MED ORDER — IBUPROFEN 600 MG PO TABS: 600.0000 mg | ORAL_TABLET | Freq: Four times a day (QID) | ORAL | 0 refills | Status: DC | PRN

## 2021-09-20 NOTE — Progress Notes (Incomplete)
Post Partum Day 1 Subjective: Patient is ambulating, eating and drinking well. She is passing urine and flatus. She denies headaches, nausea, vomiting leg pain or vision changes. She is having no difficulty breathing.  Objective: Blood pressure 110/69, pulse 90, temperature 97.7 F (36.5 C), temperature source Oral, resp. rate 17, height 5\' 3"  (1.6 m), weight 83.1 kg, last menstrual period 12/28/2020, SpO2 100 %, unknown if currently breastfeeding.  Physical Exam:  General: alert and no distress Lochia: appropriate Uterine Fundus: firm DVT Evaluation: No evidence of DVT on physical exam  Recent Labs    09/18/21 1117  HGB 11.5*  HCT 34.5*    Assessment/Plan: Eileen Graham is a 25 y.o. G4P1021 on PPD1 after VD at 1200. She is meeting all milestones. She had a baby girl and wants to bottle feed. She had a ppIUD placed. She would like to discharge today if possible.  LOS: 2 days   34 09/20/2021, 7:51 AM

## 2021-09-25 ENCOUNTER — Ambulatory Visit: Payer: Medicaid Other

## 2021-09-25 ENCOUNTER — Encounter: Payer: Medicaid Other | Admitting: Family Medicine

## 2021-10-02 ENCOUNTER — Telehealth (HOSPITAL_COMMUNITY): Payer: Self-pay | Admitting: *Deleted

## 2021-10-02 NOTE — Telephone Encounter (Signed)
Mom reports feeling good. No concerns about herself at this time. EPDS=0 [facility] score=0 ) Mom reports baby is doing well. Feeding, peeing, and pooping without difficulty. Safe sleep reviewed. Mom reports no concerns about baby at present.  Duffy Rhody, RN 10-02-2021 at 10:28am

## 2021-10-10 ENCOUNTER — Encounter (HOSPITAL_COMMUNITY): Payer: Self-pay | Admitting: Family Medicine

## 2021-10-24 ENCOUNTER — Ambulatory Visit: Payer: Medicaid Other | Admitting: Family Medicine

## 2021-10-24 ENCOUNTER — Other Ambulatory Visit: Payer: Self-pay

## 2021-10-24 MED ORDER — ALBUTEROL SULFATE HFA 108 (90 BASE) MCG/ACT IN AERS: 2.0000 | INHALATION_SPRAY | Freq: Four times a day (QID) | RESPIRATORY_TRACT | 5 refills | Status: DC | PRN

## 2021-10-24 MED ORDER — IBUPROFEN 600 MG PO TABS: 600.0000 mg | ORAL_TABLET | Freq: Four times a day (QID) | ORAL | 1 refills | Status: DC | PRN

## 2021-10-24 NOTE — Progress Notes (Signed)
Pt called and needing Rx inhaler and Ibuprofen sent to pharmacy. Pt states is out of Ibuprofen since delivery on 11/20. Pt states also needing inhaler and pharmacy claimed it was needing new Rx. Call placed to [facility], states needing new Rx Ventolin vs Proair due to insurance plan change. New Rx sent to pharmacy on file.  Call placed back to pt. Pt given update on Rx.  Judeth Cornfield, RN

## 2021-11-21 ENCOUNTER — Ambulatory Visit (INDEPENDENT_AMBULATORY_CARE_PROVIDER_SITE_OTHER): Payer: Medicaid Other | Admitting: Obstetrics and Gynecology

## 2021-11-21 ENCOUNTER — Other Ambulatory Visit: Payer: Self-pay

## 2021-11-21 ENCOUNTER — Encounter: Payer: Self-pay | Admitting: Obstetrics and Gynecology

## 2021-11-21 DIAGNOSIS — T8332XA Displacement of intrauterine contraceptive device, initial encounter: Secondary | ICD-10-CM | POA: Diagnosis not present

## 2021-11-21 DIAGNOSIS — O165 Unspecified maternal hypertension, complicating the puerperium: Secondary | ICD-10-CM | POA: Diagnosis not present

## 2021-11-21 HISTORY — DX: Unspecified maternal hypertension, complicating the puerperium: O16.5

## 2021-11-21 NOTE — Progress Notes (Signed)
Patient reports "on and off periods" along with cramps with IUD.   Eileen Graham, CMA

## 2021-11-21 NOTE — Progress Notes (Signed)
°  CC: postpartum follow up Subjective:    Patient ID: Eileen Graham, female    DOB: Dec 05, 1995, 26 y.o.   MRN: [id number]  HPI 26 yo G4P2 seen for postpartum follow up. This is the first time pt has been seen since delivery and is out of the formal postpartum window.  BP noted to be elevated mildly, but she denies headache, visual changes and RUQ pain.  Pt delivered 09/19/21, so unsure if BP is still related to pregnancy.  Pt did have postpartum IUD placed and has noted some cramping.   Review of Systems     Objective:   Physical Exam Vitals:   11/21/21 1518 11/21/21 1547  BP: (!) 145/96 (!) 142/93  Pulse: 85 89   SSE:  IUD strings not seen, cvx and vagina otherwise normal      Assessment & Plan:   1. Postpartum hypertension Will recheck BP in 2 weeks, if still elevated will start low dose Norvasc and refer to PCP for long term management  2. Intrauterine contraceptive device threads lost, initial encounter Will schedule pelvic u/s to see if IUD is still present.  Higher chance for expulsion due to postpartum placement. - US PELVIC COMPLETE WITH TRANSVAGINAL; Future    Griffin Basil, MD Faculty Attending, Center for [facility]

## 2021-11-27 ENCOUNTER — Ambulatory Visit (HOSPITAL_COMMUNITY)
Admission: RE | Admit: 2021-11-27 | Discharge: 2021-11-27 | Disposition: A | Discharge status: Home / Self Care | Payer: Medicaid Other | Source: Ambulatory Visit | Attending: Obstetrics and Gynecology | Admitting: Obstetrics and Gynecology

## 2021-11-27 ENCOUNTER — Other Ambulatory Visit: Payer: Self-pay

## 2021-11-27 ENCOUNTER — Encounter (HOSPITAL_COMMUNITY): Payer: Self-pay

## 2021-11-27 DIAGNOSIS — T8332XA Displacement of intrauterine contraceptive device, initial encounter: Secondary | ICD-10-CM

## 2021-12-05 ENCOUNTER — Ambulatory Visit: Payer: Medicaid Other

## 2022-01-15 ENCOUNTER — Telehealth: Payer: Self-pay

## 2022-01-15 NOTE — Telephone Encounter (Signed)
Pt called the front office and stated that she has an IUD and she has been bleeding for 14-15 days.  Pt reports that she is bleeding heavy soaking tampons that she changes about 5 times a day.  Per chart review pt was to have an U/S scheduled to follow up on her IUD strings.  I advised pt that she will be scheduled for an U/S to f/u on her IUD strings and then the front office will call her with an appt with Donavan Foil, MD after her U/S appt.  Pt stated that she will get her U/S appt via MyChart.  U/S scheduled for 01/24/22 @ 1230.   ? ?Delmus Warwick,RN  ?01/15/22 ?

## 2022-01-22 ENCOUNTER — Ambulatory Visit
Admission: RE | Admit: 2022-01-22 | Discharge: 2022-01-22 | Disposition: A | Discharge status: Home / Self Care | Payer: Medicaid Other | Source: Ambulatory Visit | Attending: Obstetrics and Gynecology | Admitting: Obstetrics and Gynecology

## 2022-01-22 ENCOUNTER — Other Ambulatory Visit: Payer: Self-pay

## 2022-01-22 DIAGNOSIS — T8332XA Displacement of intrauterine contraceptive device, initial encounter: Secondary | ICD-10-CM | POA: Insufficient documentation

## 2022-01-28 ENCOUNTER — Ambulatory Visit: Payer: Medicaid Other | Admitting: Obstetrics and Gynecology

## 2022-01-28 ENCOUNTER — Telehealth: Payer: Self-pay | Admitting: Obstetrics and Gynecology

## 2022-01-28 NOTE — Telephone Encounter (Signed)
Patient call to cancel her appointment for today, she request a call back with her results ?

## 2022-01-28 NOTE — Telephone Encounter (Signed)
Called patient regarding ultrasound results and recommendation of removal/replacement. Offered appt tomorrow at 235. Patient verbalized understanding and states she just stopped bleeding recently and was bleeding for 2 weeks. She wants to know if she could bleed again after tomorrow. Discussed with patient it is possible but likely the bleeding has been due to malposition. Patient verbalized understanding and asked if it would be painful. Told patient it could be crampy and she could take Rx strength ibuprofen before coming in. Patient verbalized understanding. ?

## 2022-01-29 ENCOUNTER — Ambulatory Visit: Payer: Medicaid Other | Admitting: Obstetrics and Gynecology

## 2022-01-29 NOTE — Progress Notes (Deleted)
? ?GYNECOLOGY OFFICE VISIT NOTE ? ?History:  ? Eileen Graham is a 26 y.o. [phone] here today for IUD in her lower uterine segment diagnosed in the setting of heavy bleeding.  She has also had cramping since her PP IUD insertion in November 2022. At her appt in January, her strings were not visualized.   ? ?  ?Past Medical History:  ?Diagnosis Date  ? Alopecia   ? Asthma   ? Headache   ? Infection   ? UTI  ? Shingles   ? Trichomonas infection   ? ? ?Past Surgical History:  ?Procedure Laterality Date  ? abortion    ? x2  ? NO PAST SURGERIES    ? ? ?The following portions of the patient's history were reviewed and updated as appropriate: allergies, current medications, past family history, past medical history, past social history, past surgical history and problem list.  ? ?Health Maintenance:   ?Diagnosis  ?Date Value Ref Range Status  ?03/21/2021 (A)  Final  ? - Atypical squamous cells of undetermined significance (ASC-US)  ?HPV negative  ? ?Review of Systems:  ?Pertinent items noted in HPI and remainder of comprehensive ROS otherwise negative. ? ?Physical Exam:  ?There were no vitals taken for this visit. ?CONSTITUTIONAL: Well-developed, well-nourished female in no acute distress.  ?HEENT:  Normocephalic, atraumatic. External right and left ear normal. No scleral icterus.  ?NECK: Normal range of motion, supple, no masses noted on observation ?SKIN: No rash noted. Not diaphoretic. No erythema. No pallor. ?MUSCULOSKELETAL: Normal range of motion. No edema noted. ?NEUROLOGIC: Alert and oriented to person, place, and time. Normal muscle tone coordination. No cranial nerve deficit noted. ?PSYCHIATRIC: Normal mood and affect. Normal behavior. Normal judgment and thought content. ? ?CARDIOVASCULAR: Normal heart rate noted ?RESPIRATORY: Effort and breath sounds normal, no problems with respiration noted ?ABDOMEN: No masses noted. No other overt distention noted.   ? ?PELVIC: {Blank single:[id number]::"Deferred","Normal  appearing external genitalia; normal urethral meatus; normal appearing vaginal mucosa and cervix.  No abnormal discharge noted.  Normal uterine size, no other palpable masses, no uterine or adnexal tenderness. Performed in the presence of a chaperone"} ? ?Labs and Imaging ?No results found for this or any previous visit (from the past 168 hour(s)). ?US PELVIC COMPLETE WITH TRANSVAGINAL ? ?Result Date: 01/23/2022 ?CLINICAL DATA:  Evaluate IUD location EXAM: TRANSABDOMINAL AND TRANSVAGINAL ULTRASOUND OF PELVIS TECHNIQUE: Both transabdominal and transvaginal ultrasound examinations of the pelvis were performed. Transabdominal technique was performed for global imaging of the pelvis including uterus, ovaries, adnexal regions, and pelvic cul-de-sac. It was necessary to proceed with endovaginal exam following the transabdominal exam to visualize the endometrium and ovaries. COMPARISON:  None FINDINGS: Uterus Measurements: 7.8 x 4.4 x 5.6 cm = volume: 100.3 mL. No fibroids or other mass visualized. Endometrium Endometrial thickness was not measured. However, there is no obvious thickening. The IUD is located within the endometrial canal extending from the lower uterine segment into the uterine body. Right ovary Measurements: 3.9 x 2.6 x 3.0 cm = volume: 15.7 mL. Normal appearance/no adnexal mass. Left ovary Measurements: 3.0 x 1.6 x 2.2 cm = volume: 5.6 mL. Normal appearance/no adnexal mass. Other findings Mild fluid in the pelvis is likely physiologic. IMPRESSION: 1. The IUD is identified within the endometrial canal. The IUD is relatively low lying extending from the lower uterine segment superiorly into the uterine body. 2. Endometrial thickness was not measured but there is no obvious thickening. 3. Mild fluid in the pelvis is likely physiologic.  Electronically Signed   By: Dorise Bullion III M.D.   On: 01/23/2022 19:30    ? ?*** Procedure note ?Assessment and Plan:  ? 1. Intrauterine device (IUD) migration, initial  encounter ?*** ? ? ? ?Diagnoses and all orders for this visit: ? ?Intrauterine device (IUD) migration, initial encounter ? ? ? ?Routine preventative health maintenance measures emphasized. ?Please refer to After Visit Summary for other counseling recommendations.  ? ?No follow-ups on file. ? ?Radene Gunning, MD, FACOG ?Obstetrician Social research officer, government, Faculty Practice ?Center for [place] ? ? ? ? ? ?

## 2022-01-31 ENCOUNTER — Ambulatory Visit (INDEPENDENT_AMBULATORY_CARE_PROVIDER_SITE_OTHER): Payer: Medicaid Other | Admitting: Family Medicine

## 2022-01-31 DIAGNOSIS — Z30432 Encounter for removal of intrauterine contraceptive device: Secondary | ICD-10-CM

## 2022-01-31 DIAGNOSIS — Z3009 Encounter for other general counseling and advice on contraception: Secondary | ICD-10-CM

## 2022-01-31 DIAGNOSIS — T8332XS Displacement of intrauterine contraceptive device, sequela: Secondary | ICD-10-CM | POA: Diagnosis not present

## 2022-01-31 LAB — POCT PREGNANCY, URINE: Preg Test, Ur: NEGATIVE

## 2022-01-31 MED ORDER — NORGESTIM-ETH ESTRAD TRIPHASIC 0.18/0.215/0.25 MG-25 MCG PO TABS: 1.0000 | ORAL_TABLET | Freq: Every day | ORAL | 4 refills | Status: DC

## 2022-01-31 NOTE — Progress Notes (Addendum)
? ?GYNECOLOGY OFFICE VISIT NOTE ? ?History:  ?26 y.o. [id number] here today for IUD removal and contraception counseling. Reports she has had  She denies any abnormal vaginal discharge, pelvic pain or other concerns.  ? ?#IUD ?Has post placental liletta placed ?Continues to have vaginal bleeding ?String not visualized on last speculum exam ?Had [name] that showed IUD in endometrial canal extending from lower uterine segment to uterine body ?Would like removed ?Considering replacement IUD, pills, or BTL ? ?Denies history of blood clots, migraines, blood pressure issues. Does not smoke. ? ?The following portions of the patient's history were reviewed and updated as appropriate: allergies, current medications, past family history, past medical history, past social history, past surgical history and problem list.  ? ? ? ?Review of Systems:  ?Pertinent items noted in HPI ?Review of Systems  ?All other systems reviewed and are negative. ? ?Objective:  ?Physical Exam ?BP 129/76   Pulse 96   Wt 148 lb 14.4 oz (67.5 kg)   Breastfeeding No   BMI 26.38 kg/m?  ?Physical Exam ?Vitals and nursing note reviewed.  ?HENT:  ?   Head: Normocephalic.  ?Cardiovascular:  ?   Rate and Rhythm: Normal rate.  ?   Pulses: Normal pulses.  ?Pulmonary:  ?   Effort: Pulmonary effort is normal.  ?Abdominal:  ?   General: Abdomen is flat.  ?Genitourinary: ?   Comments: Cervix visualized on speculum exam. No IUD strings visualized. Intially attempted to remove IUD with cytobrush and IUD hook without success. Kelly clamp inserted into os and IUD string grasped and able to be removed. ?Neurological:  ?   Mental Status: She is alert.  ? ? ?Labs and Imaging ?Results for orders placed or performed in visit on 01/31/22 (from the past 168 hour(s))  ?Pregnancy, urine POC  ? Collection Time: 01/31/22  3:45 PM  ?Result Value Ref Range  ? Preg Test, Ur NEGATIVE NEGATIVE  ? ?US PELVIC COMPLETE WITH TRANSVAGINAL ? ?Result Date: 01/23/2022 ?CLINICAL DATA:  Evaluate  IUD location EXAM: TRANSABDOMINAL AND TRANSVAGINAL ULTRASOUND OF PELVIS TECHNIQUE: Both transabdominal and transvaginal ultrasound examinations of the pelvis were performed. Transabdominal technique was performed for global imaging of the pelvis including uterus, ovaries, adnexal regions, and pelvic cul-de-sac. It was necessary to proceed with endovaginal exam following the transabdominal exam to visualize the endometrium and ovaries. COMPARISON:  None FINDINGS: Uterus Measurements: 7.8 x 4.4 x 5.6 cm = volume: 100.3 mL. No fibroids or other mass visualized. Endometrium Endometrial thickness was not measured. However, there is no obvious thickening. The IUD is located within the endometrial canal extending from the lower uterine segment into the uterine body. Right ovary Measurements: 3.9 x 2.6 x 3.0 cm = volume: 15.7 mL. Normal appearance/no adnexal mass. Left ovary Measurements: 3.0 x 1.6 x 2.2 cm = volume: 5.6 mL. Normal appearance/no adnexal mass. Other findings Mild fluid in the pelvis is likely physiologic. IMPRESSION: 1. The IUD is identified within the endometrial canal. The IUD is relatively low lying extending from the lower uterine segment superiorly into the uterine body. 2. Endometrial thickness was not measured but there is no obvious thickening. 3. Mild fluid in the pelvis is likely physiologic. Electronically Signed   By: Gerome Sam III M.D.   On: 01/23/2022 19:30   ? ?Assessment & Plan:  ?IUD strings lost - successfully removed  ?Per [name] liletta IUD in endometrial canal extending from lower uterine segment to uterine body. Discussed with patient that since it is a hormonal IUD  it is likely still functional and one option would be to leave it in. Patient inquired about if her bleeding (periods lasting 2 weeks and then continues spotting) could be related to IUD location. We discussed it is possible but unlikely as the hormones are still within the uterus. Patient ultimately opted to have IUD  removed.  ? ?Procedure: Cervix visualized on speculum exam. No IUD strings visualized. Intially attempted to remove IUD with cytobrush and IUD hook without success. Kelly clamp inserted into os and IUD string grasped and able to be removed without difficulty ? ? ?2. Contraceptive counseling ?We discussed in detail regarding alternative contraception methods including placing another IUD, pills, Nexplanon, and depo. Patient did not like Nexplanon side effects before and would like to avoid depo in case has similar side effects. Patient initially unsure if she would like to do another IUD at this time or interval BTL or pills. We discussed in detail regarding risks/benefits/side effects of each and she at this time opts for COCs. UPT negative ?Confirmed no contraindications and sent to patient's pharmacy.   ?Discussed that needs back up method for next 10 days ? ?Routine preventative health maintenance measures emphasized. ?Please refer to After Visit Summary for other counseling recommendations.  ? ?Return in about 8 weeks (around 03/28/2022) for Contraception check in. ? ? ?Total face-to-face time with patient: 30 minutes.  Over 50% of encounter was spent on counseling and coordination of care. ? ?Warner Mccreedy, MD, MPH ?OB Fellow, Faculty Practice ?Center for [employer], [facility] ? ?

## 2022-02-20 ENCOUNTER — Ambulatory Visit (INDEPENDENT_AMBULATORY_CARE_PROVIDER_SITE_OTHER): Payer: Medicaid Other | Admitting: General Practice

## 2022-02-20 VITALS — BP 120/77 | HR 101 | Ht 63.0 in | Wt 146.0 lb

## 2022-02-20 DIAGNOSIS — Z202 Contact with and (suspected) exposure to infections with a predominantly sexual mode of transmission: Secondary | ICD-10-CM | POA: Diagnosis not present

## 2022-02-20 MED ORDER — CEFTRIAXONE SODIUM 500 MG IJ SOLR
500.0000 mg | Freq: Once | INTRAMUSCULAR | Status: AC
  Administered 2022-02-20: 500 mg via INTRAMUSCULAR

## 2022-02-20 MED ORDER — DOXYCYCLINE MONOHYDRATE 100 MG PO CAPS: 100.0000 mg | ORAL_CAPSULE | Freq: Two times a day (BID) | ORAL | 0 refills | Status: AC

## 2022-02-20 NOTE — Progress Notes (Signed)
Patient presents to office today reporting recent exposure to gonorrhea & chlamydia through partner. She has noticed an increase in vaginal discharge with possible slight odor but denies itching/irritation. Discussed per protocol we can go ahead and treat her for gonorrhea & chlamydia. Patient verbalized understanding. Rx for Doxycycline sent to pharmacy and Rocephin administered IM without complication. Discussed when she can return for [facility] if desired and recommended abstaining from intercourse for 2 weeks. Patient verbalized understanding.  ? ?Chase Caller RN BSN ?02/20/22 ? ?

## 2022-03-01 ENCOUNTER — Ambulatory Visit: Payer: Medicaid Other | Admitting: Medical

## 2022-07-05 ENCOUNTER — Ambulatory Visit
Admission: EM | Admit: 2022-07-05 | Discharge: 2022-07-05 | Disposition: A | Discharge status: Home / Self Care | Payer: Medicaid Other | Attending: Urgent Care | Admitting: Urgent Care

## 2022-07-05 DIAGNOSIS — R070 Pain in throat: Secondary | ICD-10-CM | POA: Insufficient documentation

## 2022-07-05 DIAGNOSIS — J4530 Mild persistent asthma, uncomplicated: Secondary | ICD-10-CM | POA: Insufficient documentation

## 2022-07-05 DIAGNOSIS — R052 Subacute cough: Secondary | ICD-10-CM | POA: Insufficient documentation

## 2022-07-05 DIAGNOSIS — R1310 Dysphagia, unspecified: Secondary | ICD-10-CM | POA: Insufficient documentation

## 2022-07-05 DIAGNOSIS — B349 Viral infection, unspecified: Secondary | ICD-10-CM

## 2022-07-05 LAB — POCT RAPID STREP A (OFFICE): Rapid Strep A Screen: NEGATIVE

## 2022-07-05 MED ORDER — PREDNISONE 20 MG PO TABS: ORAL_TABLET | ORAL | 0 refills | Status: DC

## 2022-07-05 MED ORDER — VENTOLIN HFA 108 (90 BASE) MCG/ACT IN AERS: 1.0000 | INHALATION_SPRAY | RESPIRATORY_TRACT | 0 refills | Status: DC | PRN

## 2022-07-05 MED ORDER — PROMETHAZINE-DM 6.25-15 MG/5ML PO SYRP: 5.0000 mL | ORAL_SOLUTION | Freq: Every evening | ORAL | 0 refills | Status: DC | PRN

## 2022-07-05 NOTE — Discharge Instructions (Signed)
We will manage this as a viral syndrome. For sore throat or cough try using a honey-based tea. Use 3 teaspoons of honey with juice squeezed from half lemon. Place shaved pieces of ginger into 1/2-1 cup of water and warm over stove top. Then mix the ingredients and repeat every 4 hours as needed. Please take Tylenol 500mg-650mg once every 6 hours for fevers, aches and pains. Hydrate very well with at least 2 liters (64 ounces) of water. Eat light meals such as soups (chicken and noodles, chicken wild rice, vegetable).  Do not eat any foods that you are allergic to.  Start an antihistamine like Zyrtec for postnasal drainage, sinus congestion.  You can take this together with prednisone and the cough medications.  

## 2022-07-05 NOTE — ED Provider Notes (Signed)
[facility]  Note:  This document was prepared using Conservation officer, historic buildings and may include unintentional dictation errors.  MRN: [id number] DOB: 07/16/1996  Subjective:   Eileen Graham is a 26 y.o. female presenting for 5 day history of acute onset runny and stuffy nose, throat pain, painful swallowing, coughing that elicits gagging and chest pain. Has a history of asthma. Needs an albuterol inhaler. Also tried to use her nebulizer treatments. No smoking. She also took penicillin for 3 doses that she had left over medications from her dentist appointments.   No current facility-administered medications for this encounter.  Current Outpatient Medications:    albuterol (VENTOLIN HFA) 108 (90 Base) MCG/ACT inhaler, Inhale into the lungs every 6 (six) hours as needed for wheezing or shortness of breath., Disp: , Rfl:    montelukast (SINGULAIR) 10 MG tablet, Take 10 mg by mouth at bedtime., Disp: , Rfl:    Norgestimate-Ethinyl Estradiol Triphasic 0.18/0.215/0.25 MG-25 MCG tab, Take 1 tablet by mouth daily., Disp: 90 tablet, Rfl: 4   No Known Allergies  Past Medical History:  Diagnosis Date   Alopecia    Asthma    Headache    Infection    UTI   Shingles    Trichomonas infection      Past Surgical History:  Procedure Laterality Date   abortion     x2   NO PAST SURGERIES      Family History  Problem Relation Age of Onset   Hypertension Mother    Healthy Father    Asthma Maternal Grandfather    Congestive Heart Failure Maternal Grandfather     Social History   Tobacco Use   Smoking status: Former    Types: Cigarettes   Smokeless tobacco: Never   Tobacco comments:    "years ago"  Vaping Use   Vaping Use: Former   Devices: stopped with +UPT  Substance Use Topics   Alcohol use: Not Currently   Drug use: Not Currently    Types: Marijuana    Comment: August 2020    ROS   Objective:   Vitals: BP (!) 131/90   Pulse 73    Temp 98.4 F (36.9 C)   LMP 06/27/2022 (Approximate)   SpO2 96%   Physical Exam Constitutional:      General: She is not in acute distress.    Appearance: Normal appearance. She is well-developed. She is not ill-appearing, toxic-appearing or diaphoretic.  HENT:     Head: Normocephalic and atraumatic.     Nose: Congestion present. No rhinorrhea.     Mouth/Throat:     Mouth: Mucous membranes are moist.     Pharynx: No pharyngeal swelling, oropharyngeal exudate, posterior oropharyngeal erythema or uvula swelling.     Tonsils: No tonsillar exudate or tonsillar abscesses. 0 on the right. 0 on the left.  Eyes:     General: No scleral icterus.       Right eye: No discharge.        Left eye: No discharge.     Extraocular Movements: Extraocular movements intact.  Cardiovascular:     Rate and Rhythm: Normal rate and regular rhythm.     Heart sounds: Normal heart sounds. No murmur heard.    No friction rub. No gallop.  Pulmonary:     Effort: Pulmonary effort is normal. No respiratory distress.     Breath sounds: No stridor. No wheezing, rhonchi or rales.  Chest:     Chest wall:  No tenderness.  Skin:    General: Skin is warm and dry.  Neurological:     General: No focal deficit present.     Mental Status: She is alert and oriented to person, place, and time.  Psychiatric:        Mood and Affect: Mood normal.        Behavior: Behavior normal.     Results for orders placed or performed during the hospital encounter of 07/05/22 (from the past 24 hour(s))  POCT rapid strep A     Status: None   Collection Time: 07/05/22 10:36 AM  Result Value Ref Range   Rapid Strep A Screen Negative Negative    Assessment and Plan :   PDMP not reviewed this encounter.  1. Acute viral syndrome   2. Throat pain   3. Painful swallowing   4. Mild persistent asthma without complication   5. Subacute cough    Discussed antibiotic stewardship and will hold off on any further antibiotic use.  In  the context of her asthma and respiratory symptoms recommended an oral prednisone course.  Patient declined a COVID test.  Strep culture pending. Suspect viral URI, viral syndrome. Physical exam findings reassuring and vital signs stable for discharge. Advised supportive care, offered symptomatic relief. Deferred imaging given clear cardiopulmonary exam, hemodynamically stable vital signs. Counseled patient on potential for adverse effects with medications prescribed/recommended today, ER and return-to-clinic precautions discussed, patient verbalized understanding.     Wallis Bamberg, PA-C 07/05/22 1112

## 2022-07-05 NOTE — ED Triage Notes (Signed)
Pt. Sates she has been having a sore throat since Monday and it has been painful trying to swallow. She is concerned for strep throat. She has treated herself with 3 pills of penicillin and tea.

## 2022-07-08 LAB — CULTURE, GROUP A STREP (THRC)

## 2022-08-01 ENCOUNTER — Ambulatory Visit
Admission: EM | Admit: 2022-08-01 | Discharge: 2022-08-01 | Disposition: A | Discharge status: Home / Self Care | Payer: Medicaid Other | Attending: Urgent Care | Admitting: Urgent Care

## 2022-08-01 DIAGNOSIS — R0982 Postnasal drip: Secondary | ICD-10-CM | POA: Diagnosis not present

## 2022-08-01 DIAGNOSIS — J4530 Mild persistent asthma, uncomplicated: Secondary | ICD-10-CM | POA: Diagnosis not present

## 2022-08-01 MED ORDER — VENTOLIN HFA 108 (90 BASE) MCG/ACT IN AERS: 1.0000 | INHALATION_SPRAY | Freq: Four times a day (QID) | RESPIRATORY_TRACT | 0 refills | Status: DC | PRN

## 2022-08-01 MED ORDER — LEVOCETIRIZINE DIHYDROCHLORIDE 5 MG PO TABS: 5.0000 mg | ORAL_TABLET | Freq: Every evening | ORAL | 0 refills | Status: DC

## 2022-08-01 MED ORDER — ADVAIR DISKUS 250-50 MCG/ACT IN AEPB: 1.0000 | INHALATION_SPRAY | Freq: Two times a day (BID) | RESPIRATORY_TRACT | 0 refills | Status: DC

## 2022-08-01 NOTE — ED Triage Notes (Signed)
Patient presents to UC for SOB. States she has finished her inhaler prescribed to her. Was seen 09/01 SOB and no improvement.

## 2022-08-01 NOTE — Discharge Instructions (Signed)
Please continue taking your montelukast nightly. You may use your albuterol 1 to 2 puffs every 6 hours as needed. Stop your Flovent, and switch to Advair discus prescribed today. You must rinse your mouth out after use to prevent thrush. Add Xyzal to your nightly routine. Please follow-up with your PCP should your symptoms persist.

## 2022-08-01 NOTE — ED Provider Notes (Signed)
[facility]    CSN: [id number] Arrival date & time: 08/01/22  1749      History   Chief Complaint Chief Complaint  Patient presents with   Sore Throat    HPI Eileen Graham is a 26 y.o. female.   Pleasant 26 year old female presents today due to concern of sore throat and cough.  States the cough seems to cause the throat to be irritated.  She has also had upper respiratory congestion.  She was seen earlier this month for a cough, states that the prednisone and cough syrup seem to make it go away, but it seems to have returned.  Patient reports she is recently been around cleaning products that may be exacerbating her asthma.  Has been using her rescue inhaler more often than normal recently.  Continues to take her montelukast nightly.  Is not currently doing any additional antihistamines.  Has Flovent at home, but feels it is ineffective.   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   IUD strings lost 11/21/2021   Vaginal delivery 09/19/2021   Unwanted fertility 08/07/2021   Polyhydramnios affecting pregnancy in third trimester 08/07/2021   Asthma 03/21/2021   Supervision of high-risk pregnancy 03/06/2021   Allergic rhinitis 08/10/2019    Past Surgical History:  Procedure Laterality Date   abortion     x2   NO PAST SURGERIES      OB History     Gravida  4   Para  2   Term  2   Preterm  0   AB  2   Living  2      SAB  0   IAB  2   Ectopic  0   Multiple  0   Live Births  2            Home Medications    Prior to Admission medications   Medication Sig Start Date End Date Taking? Authorizing Provider  albuterol (VENTOLIN HFA) 108 (90 Base) MCG/ACT inhaler Inhale 1-2 puffs into the lungs every 6 (six) hours as needed for  wheezing or shortness of breath. 08/01/22  Yes Jerian Morais L, PA  fluticasone-salmeterol (ADVAIR DISKUS) 250-50 MCG/ACT AEPB Inhale 1 puff into the lungs in the morning and at bedtime. 08/01/22  Yes Arleigh Dicola L, PA  levocetirizine (XYZAL) 5 MG tablet Take 1 tablet (5 mg total) by mouth every evening. 08/01/22  Yes Reola Buckles L, PA  montelukast (SINGULAIR) 10 MG tablet Take 10 mg by mouth at bedtime.    [provider]  famotidine (PEPCID) 40 MG tablet Take 1 tablet (40 mg total) by mouth every evening. 08/09/19 06/12/20  Nugent, Gerrie Nordmann, NP  glycopyrrolate (ROBINUL) 2 MG tablet Take 1 tablet (2 mg total) by mouth 3 (three) times daily as needed. 08/07/19 06/12/20  Leftwich-Kirby, Kathie Dike, CNM  loratadine (CLARITIN) 10 MG tablet Take 1 tablet (10 mg total) by mouth daily. 02/19/19 06/12/20  Wieters, Hallie C, PA-C  metoCLOPramide (REGLAN) 10 MG tablet Take 1 tablet (10 mg total) by mouth 3 (three) times daily with meals. 08/09/19 06/12/20  Nugent, Gerrie Nordmann, NP  potassium chloride (KLOR-CON) 10 MEQ tablet Take 1 tablet (10 mEq total)  by mouth daily. 08/09/19 06/12/20  Nugent, Odie Sera, NP    Family History Family History  Problem Relation Age of Onset   Hypertension Mother    Healthy Father    Asthma Maternal Grandfather    Congestive Heart Failure Maternal Grandfather     Social History Social History   Tobacco Use   Smoking status: Former    Types: Cigarettes   Smokeless tobacco: Never   Tobacco comments:    "years ago"  Vaping Use   Vaping Use: Former   Devices: stopped with +UPT  Substance Use Topics   Alcohol use: Not Currently   Drug use: Not Currently    Types: Marijuana    Comment: August 2020     Allergies   Patient has no known allergies.   Review of Systems Review of Systems As per HPI  Physical Exam Triage Vital Signs ED Triage Vitals  Enc Vitals Group     BP 08/01/22 1910 115/70     Pulse Rate 08/01/22 1910 86     Resp 08/01/22 1910 16     Temp  08/01/22 1910 98 F (36.7 C)     Temp Source 08/01/22 1910 Oral     SpO2 08/01/22 1910 98 %     Weight --      Height --      Head Circumference --      Peak Flow --      Pain Score 08/01/22 1916 0     Pain Loc --      Pain Edu? --      Excl. in GC? --    No data found.  Updated Vital Signs BP 115/70 (BP Location: Left Arm)   Pulse 86   Temp 98 F (36.7 C) (Oral)   Resp 16   LMP 06/27/2022 (Approximate)   SpO2 98%   Visual Acuity Right Eye Distance:   Left Eye Distance:   Bilateral Distance:    Right Eye Near:   Left Eye Near:    Bilateral Near:     Physical Exam Vitals and nursing note reviewed.  Constitutional:      General: She is not in acute distress.    Appearance: She is well-developed and normal weight. She is not ill-appearing, toxic-appearing or diaphoretic.  HENT:     Head: Normocephalic and atraumatic.     Right Ear: Tympanic membrane and ear canal normal. No drainage, swelling or tenderness. No middle ear effusion. Tympanic membrane is not erythematous.     Left Ear: Tympanic membrane and ear canal normal. No drainage, swelling or tenderness.  No middle ear effusion. Tympanic membrane is not erythematous.     Nose: Congestion and rhinorrhea present.     Mouth/Throat:     Mouth: Mucous membranes are moist. No oral lesions.     Pharynx: Oropharynx is clear. No pharyngeal swelling, oropharyngeal exudate, posterior oropharyngeal erythema or uvula swelling.     Tonsils: No tonsillar exudate or tonsillar abscesses.  Eyes:     Extraocular Movements:     Right eye: Normal extraocular motion.     Left eye: Normal extraocular motion.     Conjunctiva/sclera: Conjunctivae normal.     Pupils: Pupils are equal, round, and reactive to light.  Neck:     Thyroid: No thyromegaly.  Cardiovascular:     Rate and Rhythm: Normal rate.     Heart sounds: Normal heart sounds. No murmur heard. Pulmonary:     Effort: Pulmonary effort is normal. No  respiratory distress.      Breath sounds: No stridor. Wheezing (minimal, scant) present. No rhonchi or rales.  Chest:     Chest wall: No tenderness.  Abdominal:     General: Bowel sounds are normal. There is no distension.     Palpations: Abdomen is soft. There is no mass.     Tenderness: There is no abdominal tenderness. There is no guarding or rebound.     Hernia: No hernia is present.  Musculoskeletal:     Cervical back: Normal range of motion and neck supple.  Lymphadenopathy:     Cervical: No cervical adenopathy.  Skin:    General: Skin is warm.     Capillary Refill: Capillary refill takes less than 2 seconds.     Coloration: Skin is not pale.     Findings: No erythema or rash.  Neurological:     General: No focal deficit present.     Mental Status: She is alert and oriented to person, place, and time.  Psychiatric:        Mood and Affect: Mood normal.        Behavior: Behavior normal.      UC Treatments / Results  Labs (all labs ordered are listed, but only abnormal results are displayed) Labs Reviewed - No data to display  EKG   Radiology No results found.  Procedures Procedures (including critical care time)  Medications Ordered in UC Medications - No data to display  Initial Impression / Assessment and Plan / UC Course  I have reviewed the triage vital signs and the nursing notes.  Pertinent labs & imaging results that were available during my care of the patient were reviewed by me and considered in my medical decision making (see chart for details).     Mild persistent asthma without complication -stop Flovent, will change to Advair.  Continue using albuterol as needed.  No indication for additional p.o. steroids at this time.  Continue montelukast, will add Xyzal. Postnasal drainage -likely the cause of the sore throat.  Benign appearing exam.  Consider adding Flonase or saline spray if symptoms persist.   Final Clinical Impressions(s) / UC Diagnoses   Final diagnoses:   Mild persistent asthma without complication  Post-nasal drainage     Discharge Instructions      Please continue taking your montelukast nightly. You may use your albuterol 1 to 2 puffs every 6 hours as needed. Stop your Flovent, and switch to Advair discus prescribed today. You must rinse your mouth out after use to prevent thrush. Add Xyzal to your nightly routine. Please follow-up with your PCP should your symptoms persist.    ED Prescriptions     Medication Sig Dispense Auth. Provider   albuterol (VENTOLIN HFA) 108 (90 Base) MCG/ACT inhaler Inhale 1-2 puffs into the lungs every 6 (six) hours as needed for wheezing or shortness of breath. 18 g Jaion Lagrange L, PA   fluticasone-salmeterol (ADVAIR DISKUS) 250-50 MCG/ACT AEPB Inhale 1 puff into the lungs in the morning and at bedtime. 180 each Azavion Bouillon L, PA   levocetirizine (XYZAL) 5 MG tablet Take 1 tablet (5 mg total) by mouth every evening. 30 tablet Shondrika Hoque L, [place]      PDMP not reviewed this encounter.   Chaney Malling, [place] 08/01/22 2151

## 2022-08-05 ENCOUNTER — Telehealth: Payer: Self-pay

## 2022-08-05 NOTE — Telephone Encounter (Signed)
Pt call returned around 11:57am today, patient verification complete (name and date of birth).  Pt states she wanted to know the results of her test from 08/01/22. Patient made aware that no test was sent out and there was no record of one. Lab called and they have no record of a test being received.

## 2022-08-06 ENCOUNTER — Other Ambulatory Visit: Payer: Self-pay | Admitting: Nurse Practitioner

## 2022-08-06 DIAGNOSIS — J45909 Unspecified asthma, uncomplicated: Secondary | ICD-10-CM

## 2022-08-27 ENCOUNTER — Ambulatory Visit
Admission: EM | Admit: 2022-08-27 | Discharge: 2022-08-27 | Disposition: A | Discharge status: Home / Self Care | Payer: Medicaid Other | Attending: Urgent Care | Admitting: Urgent Care

## 2022-08-27 DIAGNOSIS — B9789 Other viral agents as the cause of diseases classified elsewhere: Secondary | ICD-10-CM | POA: Diagnosis not present

## 2022-08-27 DIAGNOSIS — J988 Other specified respiratory disorders: Secondary | ICD-10-CM

## 2022-08-27 DIAGNOSIS — J309 Allergic rhinitis, unspecified: Secondary | ICD-10-CM

## 2022-08-27 DIAGNOSIS — J4540 Moderate persistent asthma, uncomplicated: Secondary | ICD-10-CM | POA: Diagnosis not present

## 2022-08-27 MED ORDER — ALBUTEROL SULFATE (2.5 MG/3ML) 0.083% IN NEBU: 2.5000 mg | INHALATION_SOLUTION | Freq: Four times a day (QID) | RESPIRATORY_TRACT | 12 refills | Status: DC | PRN

## 2022-08-27 MED ORDER — PREDNISONE 50 MG PO TABS: 50.0000 mg | ORAL_TABLET | Freq: Every day | ORAL | 0 refills | Status: DC

## 2022-08-27 MED ORDER — PROMETHAZINE-DM 6.25-15 MG/5ML PO SYRP
2.5000 mL | ORAL_SOLUTION | Freq: Three times a day (TID) | ORAL | 0 refills | Status: DC | PRN
Start: 2022-08-27 — End: 2023-07-28

## 2022-08-27 MED ORDER — VENTOLIN HFA 108 (90 BASE) MCG/ACT IN AERS
1.0000 | INHALATION_SPRAY | Freq: Four times a day (QID) | RESPIRATORY_TRACT | 0 refills | Status: DC | PRN
Start: 2022-08-27 — End: 2023-03-20

## 2022-08-27 NOTE — ED Triage Notes (Signed)
Patient presents to UC for SOB and nasal since sat. Treating with allergy meds and inhaler.   Denies fever.

## 2022-08-27 NOTE — ED Provider Notes (Incomplete)
[facility]  Note:  This document was prepared using Systems analyst and may include unintentional dictation errors.  MRN: [id number] DOB: February 13, 1996  Subjective:   Eileen Graham is a 26 y.o. female presenting for 3 day history of acute onset sinus symptoms, coughing, shob, wheezing. Has a history of asthma. Does not want to be COVID tested. No smoking.   No current facility-administered medications for this encounter.  Current Outpatient Medications:    albuterol (VENTOLIN HFA) 108 (90 Base) MCG/ACT inhaler, Inhale 1-2 puffs into the lungs every 6 (six) hours as needed for wheezing or shortness of breath., Disp: 18 g, Rfl: 0   fluticasone-salmeterol (ADVAIR DISKUS) 250-50 MCG/ACT AEPB, Inhale 1 puff into the lungs in the morning and at bedtime., Disp: 180 each, Rfl: 0   levocetirizine (XYZAL) 5 MG tablet, Take 1 tablet (5 mg total) by mouth every evening., Disp: 30 tablet, Rfl: 0   montelukast (SINGULAIR) 10 MG tablet, Take 10 mg by mouth at bedtime., Disp: , Rfl:    No Known Allergies  Past Medical History:  Diagnosis Date   Alopecia    Asthma    Headache    Infection    UTI   Shingles    Trichomonas infection      Past Surgical History:  Procedure Laterality Date   abortion     x2   NO PAST SURGERIES      Family History  Problem Relation Age of Onset   Hypertension Mother    Healthy Father    Asthma Maternal Grandfather    Congestive Heart Failure Maternal Grandfather     Social History   Tobacco Use   Smoking status: Former    Types: Cigarettes   Smokeless tobacco: Never   Tobacco comments:    "years ago"  Vaping Use   Vaping Use: Former   Devices: stopped with +UPT  Substance Use Topics   Alcohol use: Not Currently   Drug use: Not Currently    Types: Marijuana    Comment: August 2020    ROS   Objective:   Vitals: BP 129/74 (BP Location: Right Arm)   Pulse 91   Temp 97.6 F (36.4 C) (Oral)    Resp 16   LMP 08/22/2022 (Approximate)   SpO2 97%   Breastfeeding No   Physical Exam Constitutional:      General: She is not in acute distress.    Appearance: Normal appearance. She is well-developed. She is not ill-appearing, toxic-appearing or diaphoretic.  HENT:     Head: Normocephalic and atraumatic.     Nose: Nose normal.     Mouth/Throat:     Mouth: Mucous membranes are moist.  Eyes:     General: No scleral icterus.       Right eye: No discharge.        Left eye: No discharge.     Extraocular Movements: Extraocular movements intact.  Cardiovascular:     Rate and Rhythm: Normal rate and regular rhythm.     Heart sounds: Normal heart sounds. No murmur heard.    No friction rub. No gallop.  Pulmonary:     Effort: Pulmonary effort is normal. No respiratory distress.     Breath sounds: No stridor. Decreased breath sounds (mild throughout) present. No wheezing, rhonchi or rales.  Chest:     Chest wall: No tenderness.  Skin:    General: Skin is warm and dry.  Neurological:     General:  No focal deficit present.     Mental Status: She is alert and oriented to person, place, and time.  Psychiatric:        Mood and Affect: Mood normal.        Behavior: Behavior normal.     Assessment and Plan :   PDMP not reviewed this encounter.  1. Viral respiratory illness   2. Moderate persistent asthma without complication   3. Allergic rhinitis, unspecified seasonality, unspecified trigger     Prednisone for respiratory symptoms in context of asthma. Suspect viral URI, viral syndrome. No adventitious lungs sounds, deferred x-ray. Physical exam findings reassuring and vital signs stable for discharge. Advised supportive care, offered symptomatic relief. Counseled patient on potential for adverse effects with medications prescribed/recommended today, ER and return-to-clinic precautions discussed, patient verbalized understanding.   Patient declined COVID testing. Refilled asthma  meds.     Wallis Bamberg, PA-C 08/28/22 0410

## 2022-11-13 ENCOUNTER — Ambulatory Visit
Admission: EM | Admit: 2022-11-13 | Discharge: 2022-11-13 | Disposition: A | Discharge status: Home / Self Care | Payer: Medicaid Other | Attending: Internal Medicine | Admitting: Internal Medicine

## 2022-11-13 DIAGNOSIS — R102 Pelvic and perineal pain: Secondary | ICD-10-CM

## 2022-11-13 LAB — POCT URINALYSIS DIP (MANUAL ENTRY)
Bilirubin, UA: NEGATIVE
Blood, UA: NEGATIVE
Glucose, UA: NEGATIVE mg/dL
Ketones, POC UA: NEGATIVE mg/dL
Leukocytes, UA: NEGATIVE
Nitrite, UA: NEGATIVE
Protein Ur, POC: NEGATIVE mg/dL
Spec Grav, UA: >=1.03 — AB (ref 1.010–1.025)
Urobilinogen, UA: 1 E.U./dL
pH, UA: 6 (ref 5.0–8.0)

## 2022-11-13 LAB — POCT URINE PREGNANCY: Preg Test, Ur: NEGATIVE

## 2022-11-13 MED ORDER — NAPROXEN 500 MG PO TABS: 500.0000 mg | ORAL_TABLET | Freq: Two times a day (BID) | ORAL | 0 refills | Status: DC

## 2022-11-13 NOTE — ED Provider Notes (Signed)
[facility]  Note:  This document was prepared using Systems analyst and may include unintentional dictation errors.  MRN: [id number] DOB: [birth year]  Subjective:   Eileen Graham is a 27 y.o. female presenting for 3 to 4-day history of acute onset persistent pelvic pains. Patient drank heavily over the weekend. Since then has progressively started having her pelvic pains. Denies fever, n/v, rashes, dysuria, urinary frequency, hematuria, vaginal discharge.  She is not opposed to vaginal swab testing.  No current facility-administered medications for this encounter.  Current Outpatient Medications:    albuterol (PROVENTIL) (2.5 MG/3ML) 0.083% nebulizer solution, Take 3 mLs (2.5 mg total) by nebulization every 6 (six) hours as needed for wheezing or shortness of breath., Disp: 75 mL, Rfl: 12   albuterol (VENTOLIN HFA) 108 (90 Base) MCG/ACT inhaler, Inhale 1-2 puffs into the lungs every 6 (six) hours as needed for wheezing or shortness of breath., Disp: 18 g, Rfl: 0   fluticasone-salmeterol (ADVAIR DISKUS) 250-50 MCG/ACT AEPB, Inhale 1 puff into the lungs in the morning and at bedtime., Disp: 180 each, Rfl: 0   levocetirizine (XYZAL) 5 MG tablet, Take 1 tablet (5 mg total) by mouth every evening., Disp: 30 tablet, Rfl: 0   montelukast (SINGULAIR) 10 MG tablet, Take 10 mg by mouth at bedtime., Disp: , Rfl:    predniSONE (DELTASONE) 50 MG tablet, Take 1 tablet (50 mg total) by mouth daily with breakfast., Disp: 5 tablet, Rfl: 0   promethazine-dextromethorphan (PROMETHAZINE-DM) 6.25-15 MG/5ML syrup, Take 2.5 mLs by mouth 3 (three) times daily as needed for cough., Disp: 100 mL, Rfl: 0   No Known Allergies  Past Medical History:  Diagnosis Date   Alopecia    Asthma    Headache    Infection    UTI   Shingles    Trichomonas infection      Past Surgical History:  Procedure Laterality Date   abortion     x2   NO PAST SURGERIES       Family History  Problem Relation Age of Onset   Hypertension Mother    Healthy Father    Asthma Maternal Grandfather    Congestive Heart Failure Maternal Grandfather     Social History   Tobacco Use   Smoking status: Former    Types: Cigarettes   Smokeless tobacco: Never   Tobacco comments:    "years ago"  Vaping Use   Vaping Use: Former   Devices: stopped with +UPT  Substance Use Topics   Alcohol use: Yes    Comment: occ   Drug use: Not Currently    ROS   Objective:   Vitals: BP 120/70 (BP Location: Right Arm)   Pulse 92   Temp 97.7 F (36.5 C) (Oral)   Resp 17   LMP 10/17/2022   SpO2 97%   Physical Exam Constitutional:      General: She is not in acute distress.    Appearance: Normal appearance. She is well-developed. She is not ill-appearing, toxic-appearing or diaphoretic.  HENT:     Head: Normocephalic and atraumatic.     Nose: Nose normal.     Mouth/Throat:     Mouth: Mucous membranes are moist.  Eyes:     General: No scleral icterus.       Right eye: No discharge.        Left eye: No discharge.     Extraocular Movements: Extraocular movements intact.     Conjunctiva/sclera: Conjunctivae normal.  Cardiovascular:     Rate and Rhythm: Normal rate.  Pulmonary:     Effort: Pulmonary effort is normal.  Abdominal:     General: Bowel sounds are normal. There is no distension.     Palpations: Abdomen is soft. There is no mass.     Tenderness: There is abdominal tenderness (suprapubic). There is no right CVA tenderness, left CVA tenderness, guarding or rebound.  Skin:    General: Skin is warm and dry.  Neurological:     General: No focal deficit present.     Mental Status: She is alert and oriented to person, place, and time.  Psychiatric:        Mood and Affect: Mood normal.        Behavior: Behavior normal.        Thought Content: Thought content normal.        Judgment: Judgment normal.     Results for orders placed or performed during  the hospital encounter of 11/13/22 (from the past 24 hour(s))  POCT urinalysis dipstick     Status: Abnormal   Collection Time: 11/13/22  5:59 PM  Result Value Ref Range   Color, UA yellow yellow   Clarity, UA clear clear   Glucose, UA negative negative mg/dL   Bilirubin, UA negative negative   Ketones, POC UA negative negative mg/dL   Spec Grav, UA >=1.030 (A) 1.010 - 1.025   Blood, UA negative negative   pH, UA 6.0 5.0 - 8.0   Protein Ur, POC negative negative mg/dL   Urobilinogen, UA 1.0 0.2 or 1.0 E.U./dL   Nitrite, UA Negative Negative   Leukocytes, UA Negative Negative  POCT urine pregnancy     Status: None   Collection Time: 11/13/22  5:59 PM  Result Value Ref Range   Preg Test, Ur Negative Negative    Assessment and Plan :   PDMP not reviewed this encounter.  1. Acute pelvic pain, female     As there is no discharge, we will hold off on medications for STIs, BV or yeast.  Recommended hydrating very well consistently.  Avoid urogenital irritants especially alcohol.  Use naproxen for pain and inflammation.  Low suspicion for an acute gynecologic emergency. Counseled patient on potential for adverse effects with medications prescribed/recommended today, ER and return-to-clinic precautions discussed, patient verbalized understanding.    Jaynee Eagles, [place] 11/13/22 4128

## 2022-11-13 NOTE — ED Triage Notes (Signed)
Pt c/o pelvic pain x 3-4 days-denies vaginal d/c and urinary sx-NAD-steady gait

## 2022-11-14 ENCOUNTER — Telehealth (HOSPITAL_COMMUNITY): Payer: Self-pay | Admitting: Emergency Medicine

## 2022-11-14 ENCOUNTER — Telehealth: Payer: Self-pay

## 2022-11-14 LAB — CERVICOVAGINAL ANCILLARY ONLY
Bacterial Vaginitis (gardnerella): POSITIVE — AB
Candida Glabrata: NEGATIVE
Candida Vaginitis: NEGATIVE
Chlamydia: NEGATIVE
Comment: NEGATIVE
Comment: NEGATIVE
Comment: NEGATIVE
Comment: NEGATIVE
Comment: NEGATIVE
Comment: NORMAL
Neisseria Gonorrhea: NEGATIVE
Trichomonas: NEGATIVE

## 2022-11-14 MED ORDER — METRONIDAZOLE 500 MG PO TABS: 500.0000 mg | ORAL_TABLET | Freq: Two times a day (BID) | ORAL | 0 refills | Status: DC

## 2022-11-14 NOTE — Telephone Encounter (Signed)
Pt called and requested rx flagyl to be set to [facility] on [address]
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
## 2023-02-25 IMAGING — US US OB < 14 WEEKS - US OB TV
1 series · 15 of 28 positions shown · non-contrast
Comparison: None

CLINICAL DATA: Pelvic pain for 1 week, pregnant, LMP 12/28/2020

EXAM:
OBSTETRIC <14 WK US AND TRANSVAGINAL OB US
TECHNIQUE: Both transabdominal and transvaginal ultrasound examinations were
performed for complete evaluation of the gestation as well as the
maternal uterus, adnexal regions, and pelvic cul-de-sac.
Transvaginal technique was performed to assess early pregnancy.

[Series 1: us ob < 14 weeks - us ob tv · 54 acquisitions, 15 frames shown]
[im 1/54]
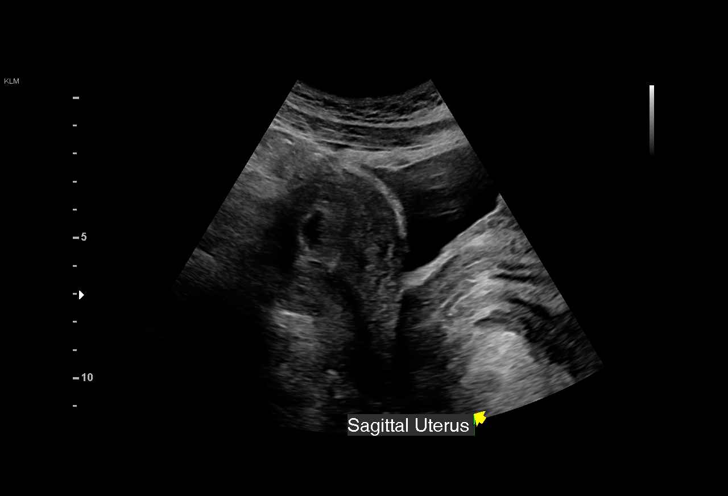
[im 4/54]
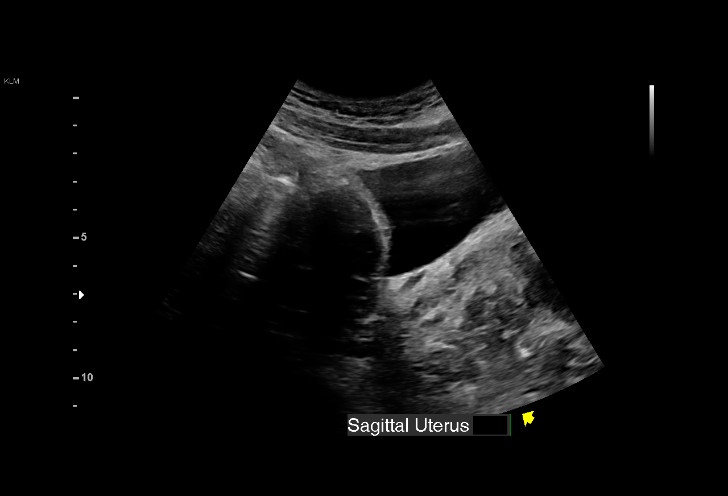
[im 8/54]
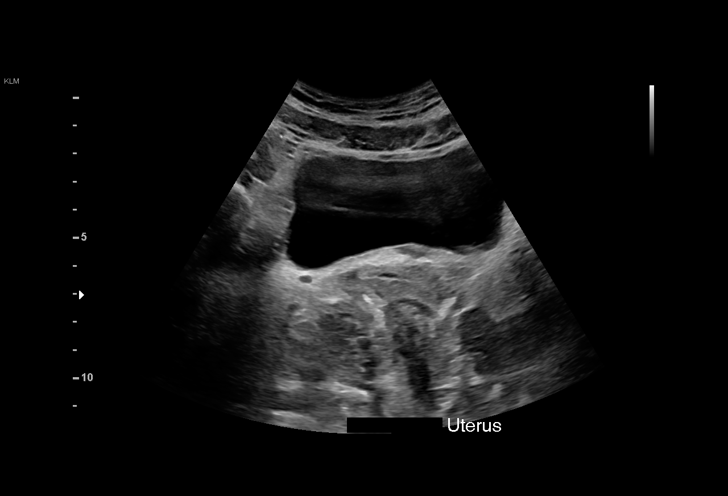
[im 12/54]
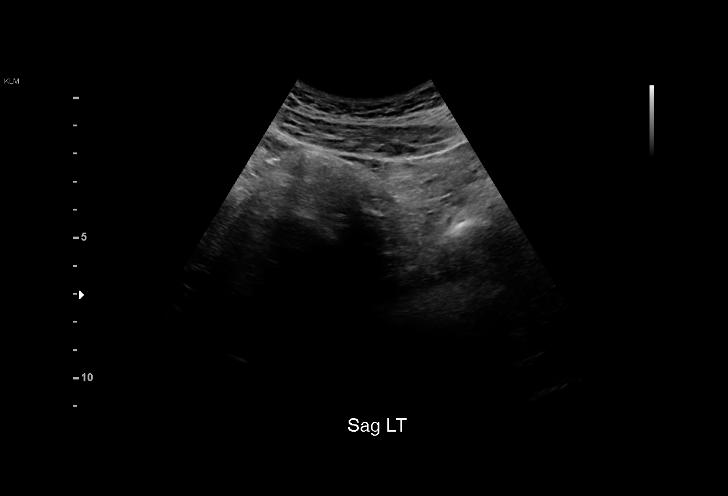
[im 16/54]
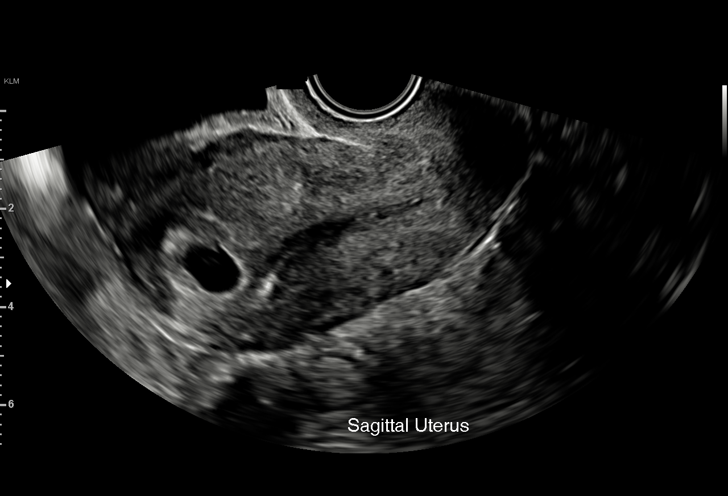
[im 20/54]
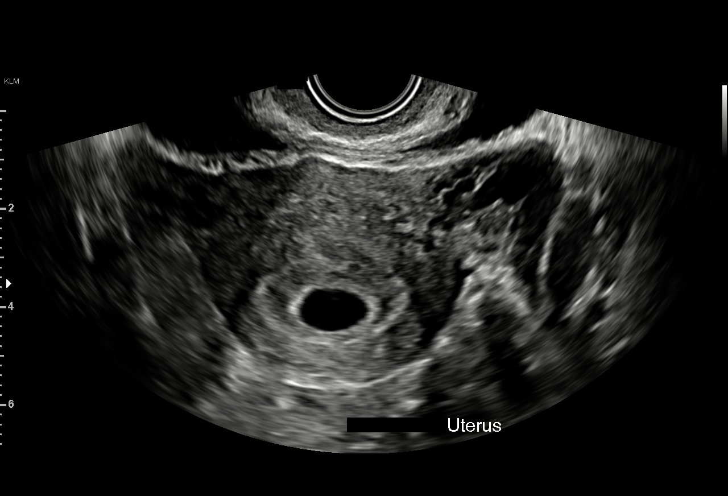
[im 24/54]
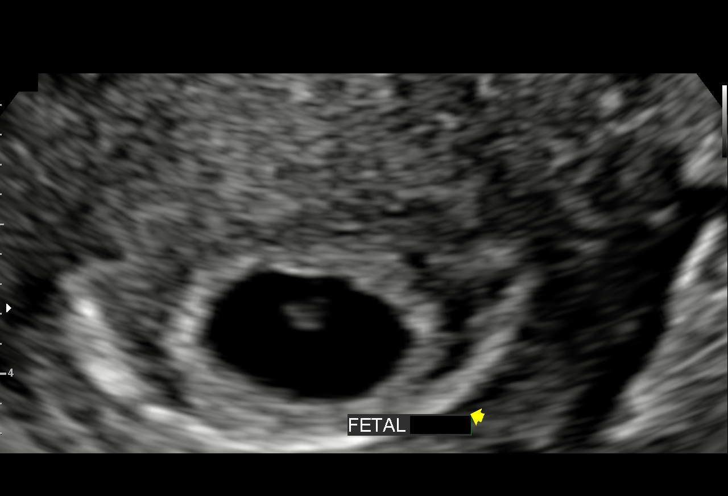
[im 28/54]
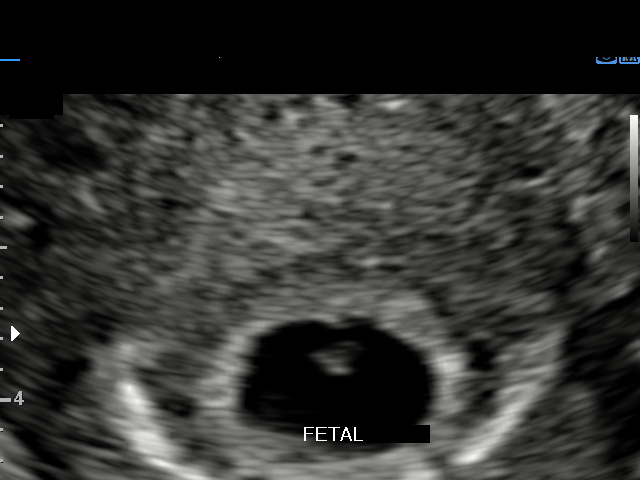
[im 30/54]
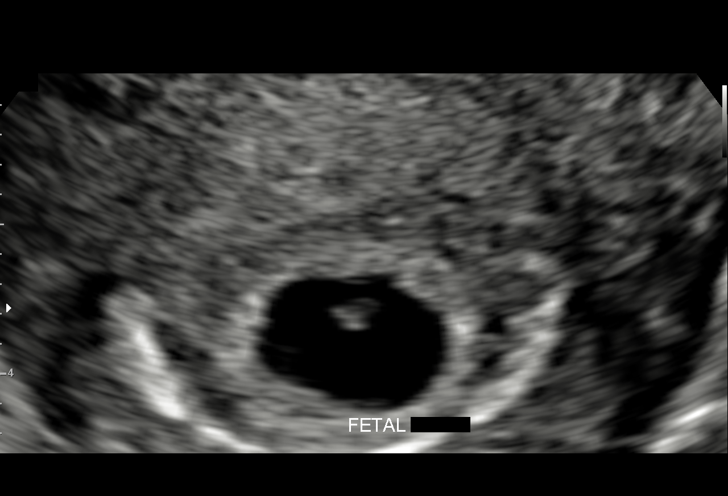
[im 34/54]
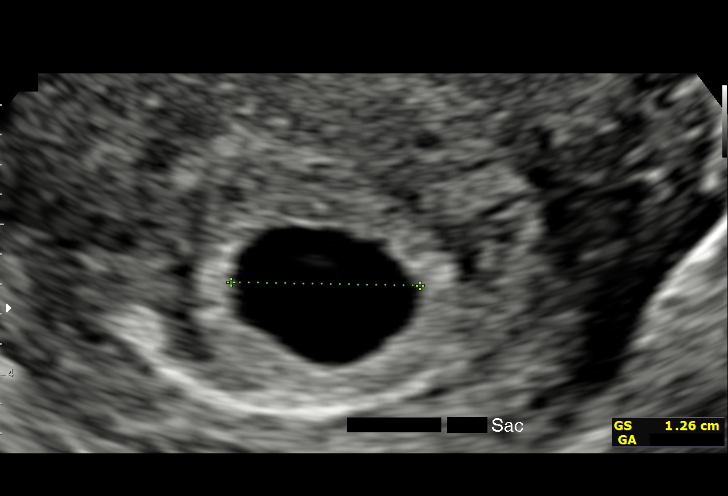
[im 38/54]
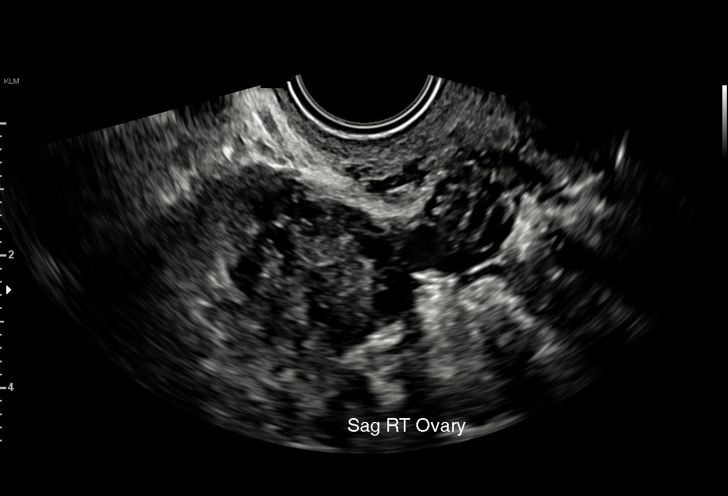
[im 42/54]
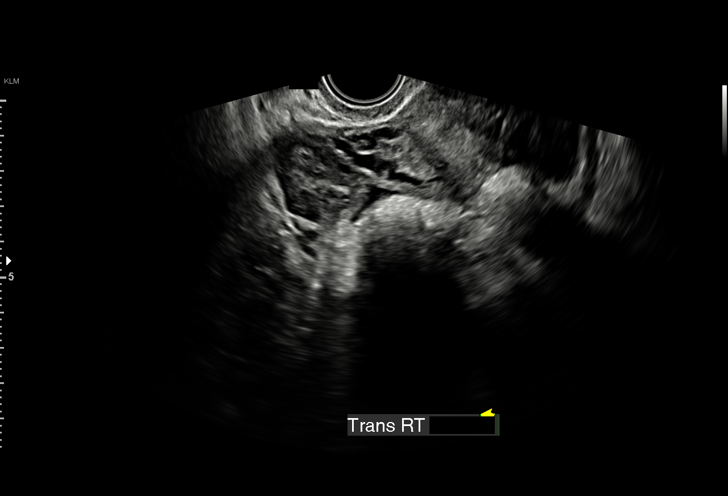
[im 46/54]
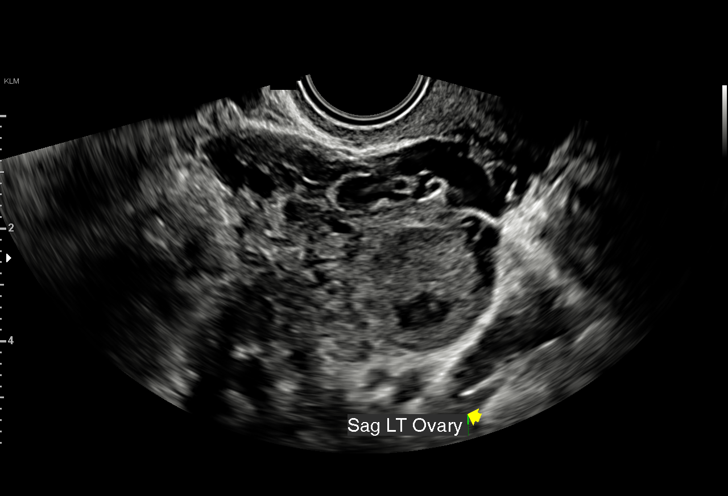
[im 50/54]
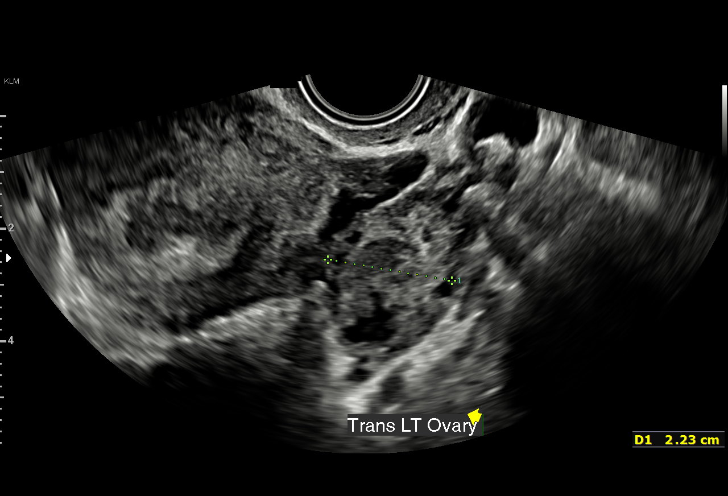
[im 54/54]
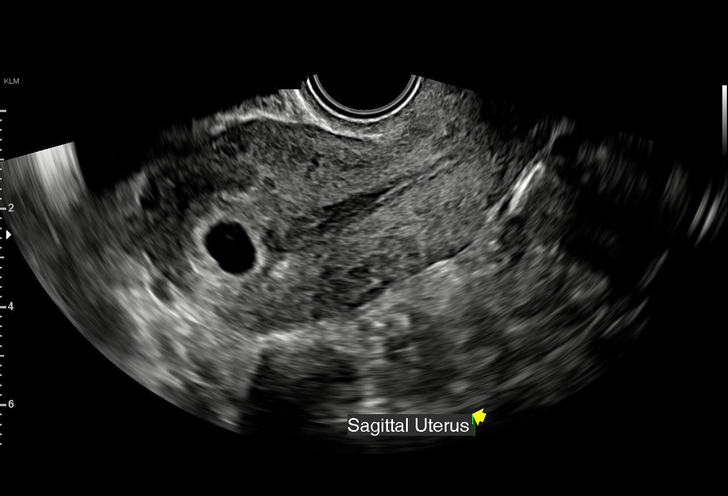

[15 of 28 positions shown; findings below may reference images not displayed]

FINDINGS: Intrauterine gestational sac: Present

Yolk sac:  Not definitely visualize

Embryo:  Questionably present

Cardiac Activity: Not identified

Heart Rate: N/A  bpm

MSD: 11.4 mm   5 w   6 d

Subchorionic hemorrhage:  None visualized.

Maternal uterus/adnexae:

RIGHT ovary normal size and morphology, 2.8 x 2.2 x 2.1 cm.

LEFT ovary measures 3.3 x 2.8 x 2.2 cm and contains a small corpus
luteum.

No free pelvic fluid or adnexal masses.
IMPRESSION: Gestational sac seen within uterus with mean sac diameter of 5 weeks
6 days EGA.

Questionable fetal pole.

Unable to establish fetal viability; may consider follow-up
ultrasound in 14 days to establish viability if clinically
indicated.

## 2023-03-17 IMAGING — US US OB TRANSVAGINAL
1 series · 15 of 28 positions shown · non-contrast
Comparison: Pelvic ultrasound 02/06/2021.

CLINICAL DATA: Pregnancy of unknown location.

EXAM:
TRANSVAGINAL OB ULTRASOUND
TECHNIQUE: Transvaginal ultrasound was performed for complete evaluation of the
gestation as well as the maternal uterus, adnexal regions, and
pelvic cul-de-sac.

[Series 1: us ob transvaginal · 59 acquisitions, 15 frames shown]
[im 1/59]
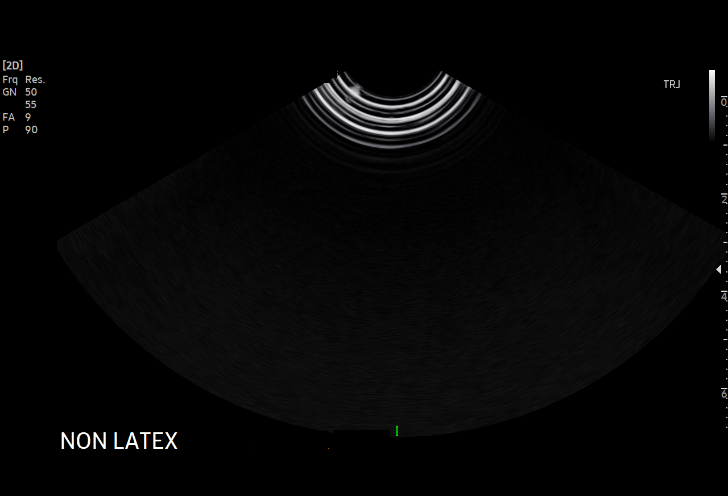
[im 5/59]
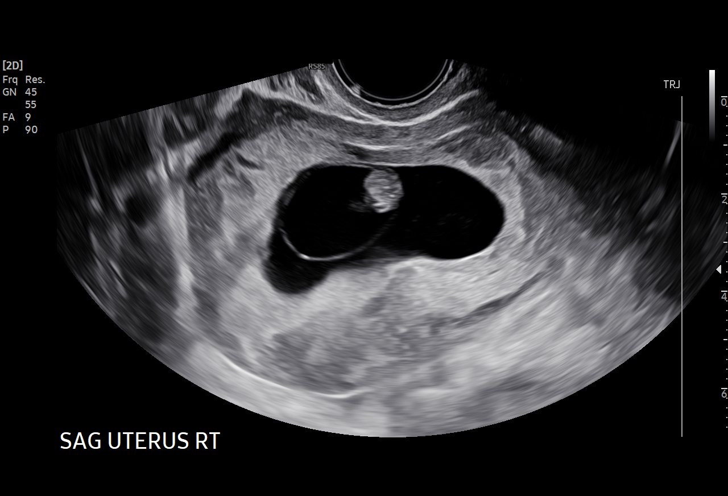
[im 9/59]
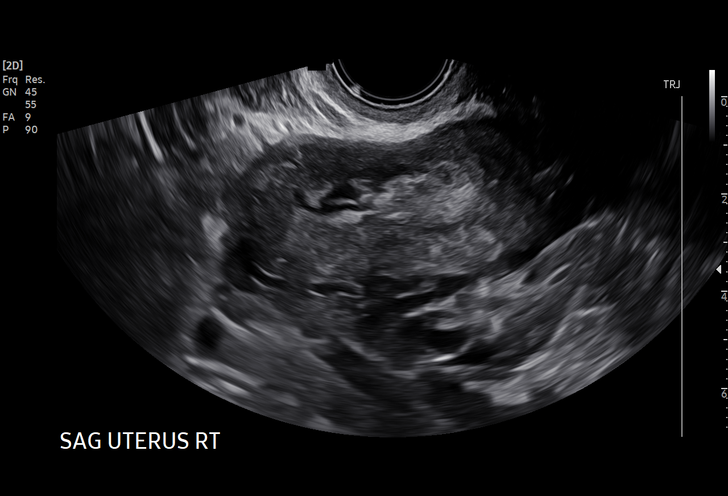
[im 13/59]
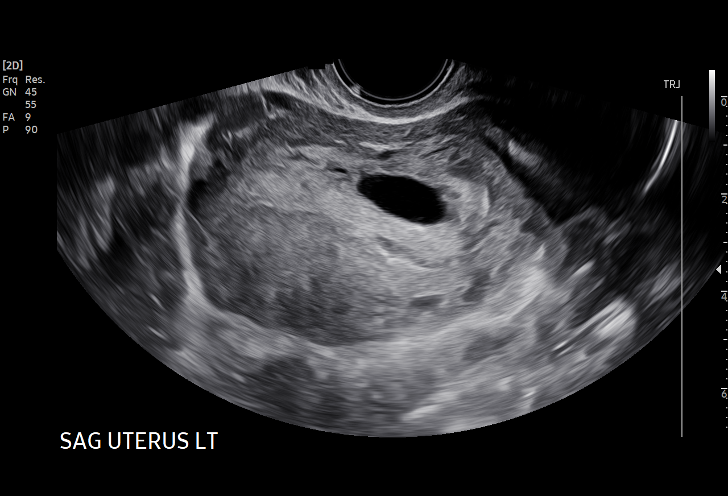
[im 18/59]
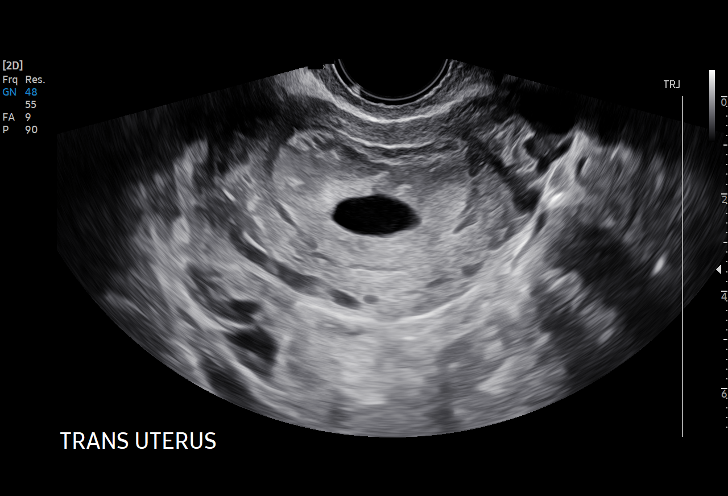
[im 22/59]
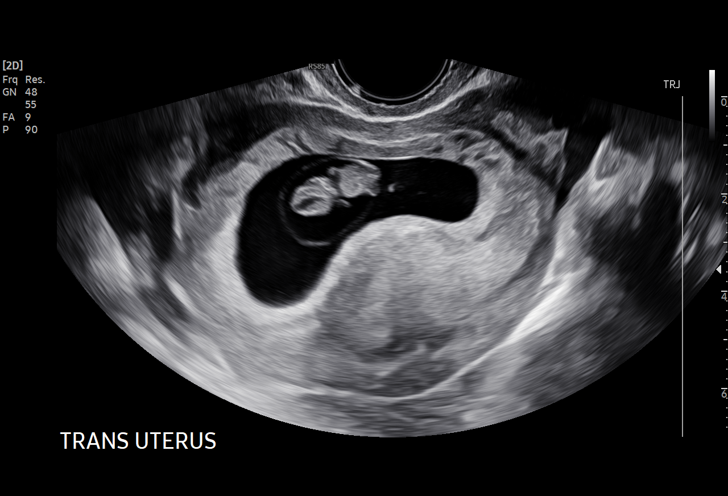
[im 26/59]
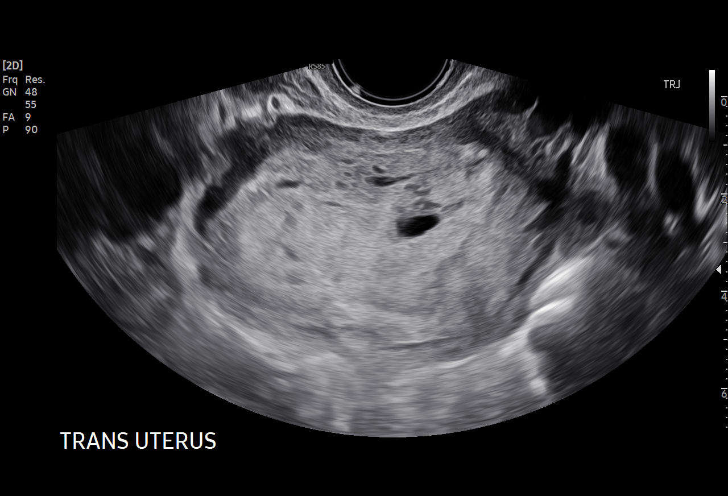
[im 31/59]
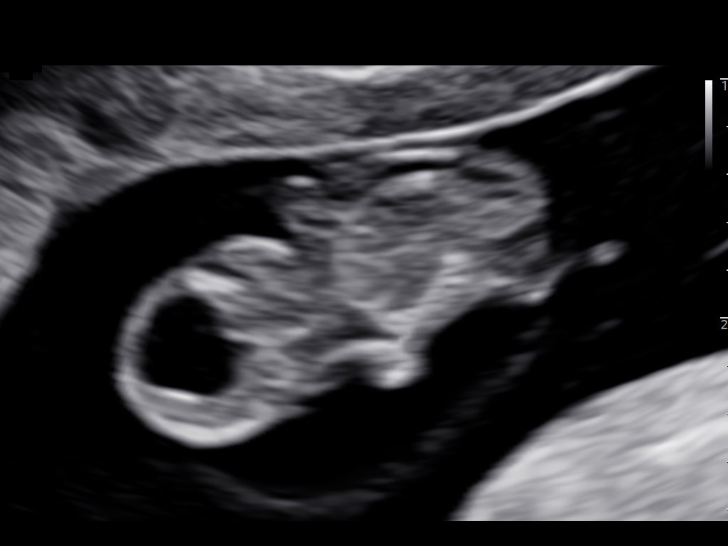
[im 33/59]
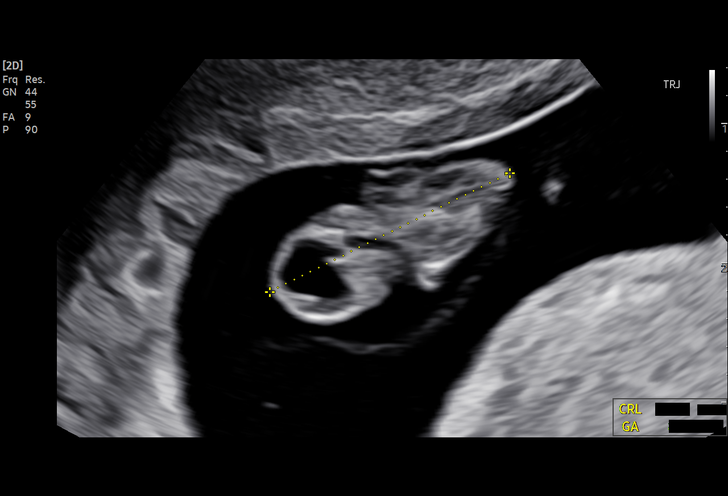
[im 37/59]
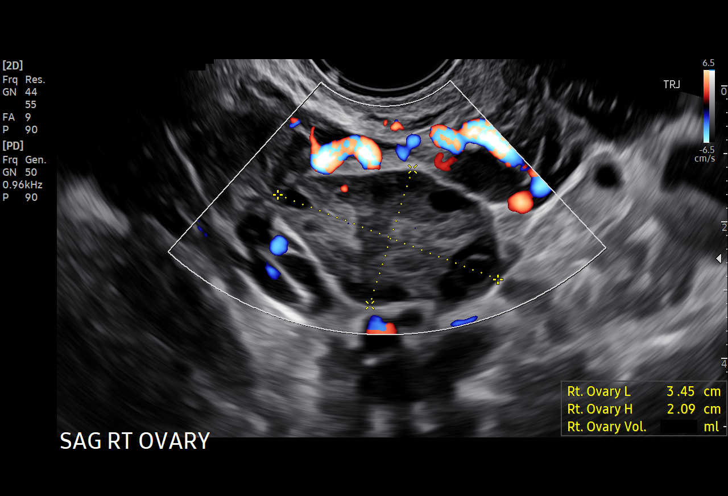
[im 41/59]
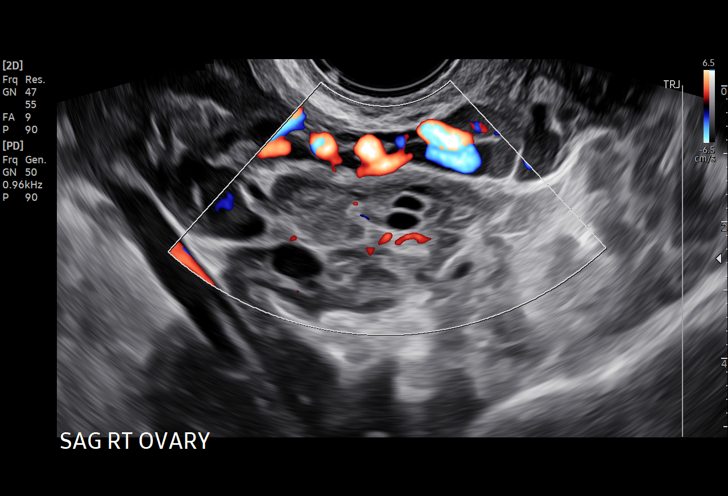
[im 46/59]
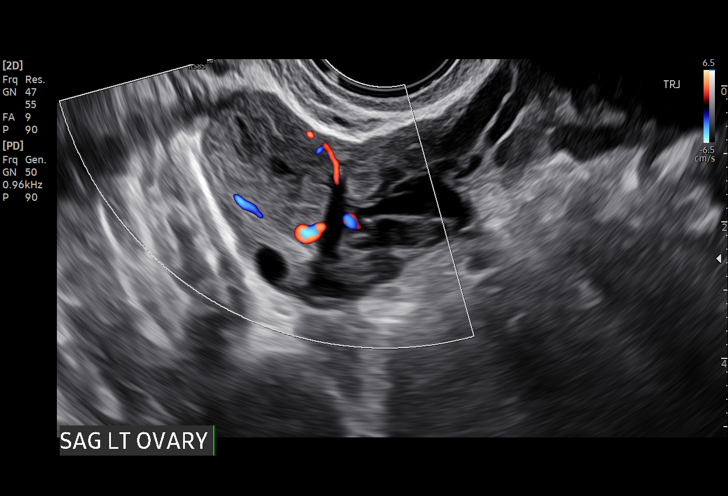
[im 50/59]
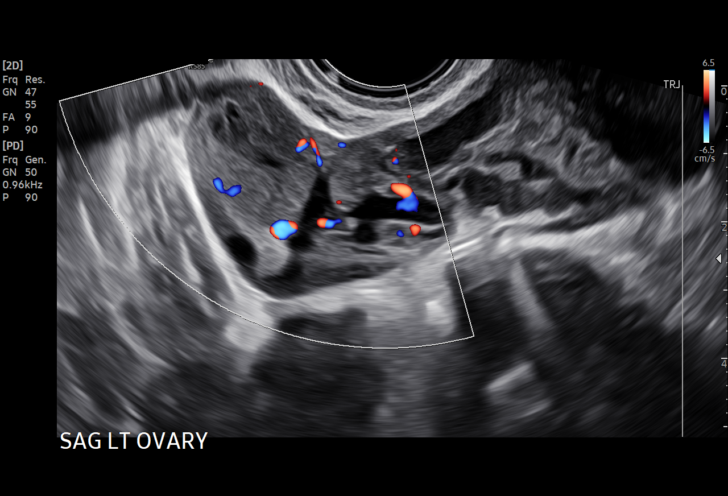
[im 54/59]
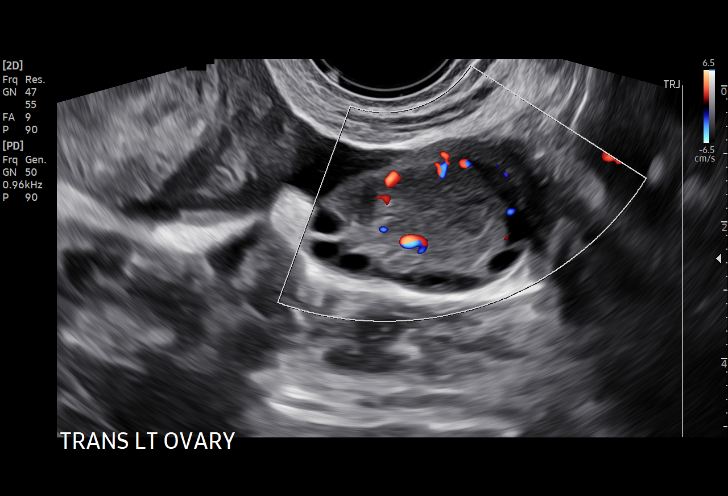
[im 59/59]
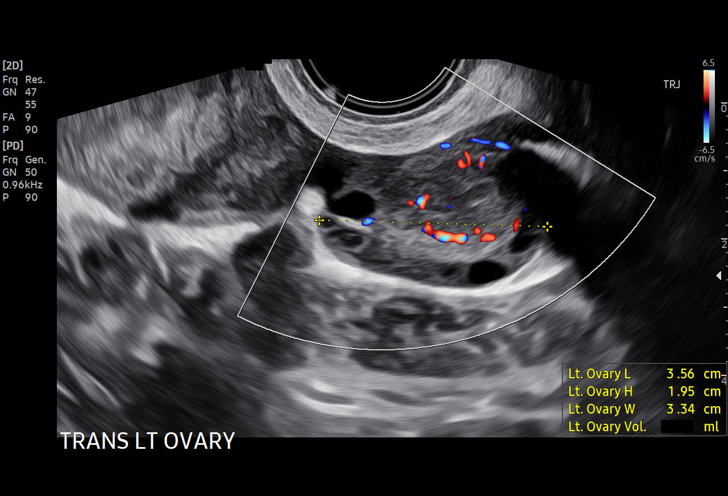

[15 of 28 positions shown; findings below may reference images not displayed]

FINDINGS: Intrauterine gestational sac: Single.

Yolk sac:  Visualized.

Embryo:  Visualized.

Cardiac Activity: Detected.

Heart Rate: 166 bpm

CRL:   19.6 mm   8 w 4 d                  US EDC: 10/04/2021.

Subchorionic hemorrhage:  None visualized.

Maternal uterus/adnexae: Corpus luteum cyst on the left noted. Trace
amount of free pelvic fluid is seen.
IMPRESSION: Single living intrauterine pregnancy.  No acute finding.

## 2023-03-20 ENCOUNTER — Ambulatory Visit
Admission: EM | Admit: 2023-03-20 | Discharge: 2023-03-20 | Disposition: A | Discharge status: Home / Self Care | Payer: Medicaid Other | Attending: Internal Medicine | Admitting: Internal Medicine

## 2023-03-20 DIAGNOSIS — R300 Dysuria: Secondary | ICD-10-CM | POA: Diagnosis not present

## 2023-03-20 DIAGNOSIS — J4530 Mild persistent asthma, uncomplicated: Secondary | ICD-10-CM | POA: Diagnosis not present

## 2023-03-20 DIAGNOSIS — N3001 Acute cystitis with hematuria: Secondary | ICD-10-CM | POA: Insufficient documentation

## 2023-03-20 DIAGNOSIS — G4700 Insomnia, unspecified: Secondary | ICD-10-CM | POA: Insufficient documentation

## 2023-03-20 LAB — POCT URINALYSIS DIP (MANUAL ENTRY)
Bilirubin, UA: NEGATIVE
Glucose, UA: NEGATIVE mg/dL
Ketones, POC UA: NEGATIVE mg/dL
Nitrite, UA: POSITIVE — AB
Protein Ur, POC: NEGATIVE mg/dL
Spec Grav, UA: 1.025 (ref 1.010–1.025)
Urobilinogen, UA: 0.2 E.U./dL
pH, UA: 6 (ref 5.0–8.0)

## 2023-03-20 LAB — POCT URINE PREGNANCY: Preg Test, Ur: NEGATIVE

## 2023-03-20 MED ORDER — FLUCONAZOLE 150 MG PO TABS: 150.0000 mg | ORAL_TABLET | ORAL | 0 refills | Status: DC

## 2023-03-20 MED ORDER — CEPHALEXIN 500 MG PO CAPS: 500.0000 mg | ORAL_CAPSULE | Freq: Two times a day (BID) | ORAL | 0 refills | Status: DC

## 2023-03-20 MED ORDER — HYDROXYZINE HCL 25 MG PO TABS
ORAL_TABLET | ORAL | 0 refills | Status: AC
Start: 2023-03-20 — End: ?

## 2023-03-20 MED ORDER — VENTOLIN HFA 108 (90 BASE) MCG/ACT IN AERS: 1.0000 | INHALATION_SPRAY | Freq: Four times a day (QID) | RESPIRATORY_TRACT | 0 refills | Status: DC | PRN

## 2023-03-20 NOTE — ED Triage Notes (Signed)
Pt c/o dysuria, urinary freq x 4 days-using AZO-NAD-steady gait

## 2023-03-20 NOTE — Discharge Instructions (Addendum)
Please start Keflex to address an urinary tract infection. Make sure you hydrate very well with plain water and a quantity of 64 ounces of water a day.  Please limit drinks that are considered urinary irritants such as soda, sweet tea, coffee, energy drinks, alcohol.  These can worsen your urinary and genital symptoms but also be the source of them.  I will let you know about your urine culture results through MyChart to see if we need to prescribe or change your antibiotics based off of those results.  Contact [facility] to establish care with a new PCP to get follow-up and further management for insomnia and your asthma.

## 2023-03-20 NOTE — ED Provider Notes (Signed)
[facility]  Note:  This document was prepared using Conservation officer, historic buildings and may include unintentional dictation errors.  MRN: [id number] DOB: 05-21-1996  Subjective:   Eileen Graham is a 27 y.o. female presenting for multiple reasons.  Reports 4-day history of urinary frequency, dysuria, urinary urgency.  Has been hydrating very consistently.  No vaginal discharge, pelvic pain, fever, nausea, vomiting.  Has no concerns about STI.  She does get yeast infections with antibiotic use. Reports difficulty with insomnia.  She has 2 children at home and they still very well but unfortunately the patient has a very difficult time falling asleep.  She is averaging 4 hours a night.  Would like to take medication as she has failed multiple over-the-counter measures.  Does not have a PCP. Requests medication refill of her albuterol inhaler.   No current facility-administered medications for this encounter.  Current Outpatient Medications:    albuterol (PROVENTIL) (2.5 MG/3ML) 0.083% nebulizer solution, Take 3 mLs (2.5 mg total) by nebulization every 6 (six) hours as needed for wheezing or shortness of breath., Disp: 75 mL, Rfl: 12   albuterol (VENTOLIN HFA) 108 (90 Base) MCG/ACT inhaler, Inhale 1-2 puffs into the lungs every 6 (six) hours as needed for wheezing or shortness of breath., Disp: 18 g, Rfl: 0   fluticasone-salmeterol (ADVAIR DISKUS) 250-50 MCG/ACT AEPB, Inhale 1 puff into the lungs in the morning and at bedtime., Disp: 180 each, Rfl: 0   levocetirizine (XYZAL) 5 MG tablet, Take 1 tablet (5 mg total) by mouth every evening., Disp: 30 tablet, Rfl: 0   metroNIDAZOLE (FLAGYL) 500 MG tablet, Take 1 tablet (500 mg total) by mouth 2 (two) times daily., Disp: 14 tablet, Rfl: 0   montelukast (SINGULAIR) 10 MG tablet, Take 10 mg by mouth at bedtime., Disp: , Rfl:    naproxen (NAPROSYN) 500 MG tablet, Take 1 tablet (500 mg total) by mouth 2 (two) times  daily with a meal., Disp: 30 tablet, Rfl: 0   predniSONE (DELTASONE) 50 MG tablet, Take 1 tablet (50 mg total) by mouth daily with breakfast., Disp: 5 tablet, Rfl: 0   promethazine-dextromethorphan (PROMETHAZINE-DM) 6.25-15 MG/5ML syrup, Take 2.5 mLs by mouth 3 (three) times daily as needed for cough., Disp: 100 mL, Rfl: 0   No Known Allergies  Past Medical History:  Diagnosis Date   Alopecia    Asthma    Headache    Infection    UTI   Shingles    Trichomonas infection      Past Surgical History:  Procedure Laterality Date   abortion     x2   NO PAST SURGERIES      Family History  Problem Relation Age of Onset   Hypertension Mother    Healthy Father    Asthma Maternal Grandfather    Congestive Heart Failure Maternal Grandfather     Social History   Tobacco Use   Smoking status: Former    Types: Cigarettes   Smokeless tobacco: Never   Tobacco comments:    "years ago"  Vaping Use   Vaping Use: Former   Devices: stopped with +UPT  Substance Use Topics   Alcohol use: Yes    Comment: occ   Drug use: Not Currently    ROS   Objective:   Vitals: BP (!) 131/91 (BP Location: Left Arm)   Pulse 75   Temp 98.1 F (36.7 C) (Oral)   Resp 20   LMP 03/12/2023 (Approximate)  SpO2 96%   Physical Exam Constitutional:      General: She is not in acute distress.    Appearance: Normal appearance. She is well-developed. She is not ill-appearing, toxic-appearing or diaphoretic.  HENT:     Head: Normocephalic and atraumatic.     Nose: Nose normal.     Mouth/Throat:     Mouth: Mucous membranes are moist.  Eyes:     General: No scleral icterus.       Right eye: No discharge.        Left eye: No discharge.     Extraocular Movements: Extraocular movements intact.     Conjunctiva/sclera: Conjunctivae normal.  Cardiovascular:     Rate and Rhythm: Normal rate.  Pulmonary:     Effort: Pulmonary effort is normal.  Abdominal:     General: Bowel sounds are normal. There  is no distension.     Palpations: Abdomen is soft. There is no mass.     Tenderness: There is no abdominal tenderness. There is no right CVA tenderness, left CVA tenderness, guarding or rebound.  Skin:    General: Skin is warm and dry.  Neurological:     General: No focal deficit present.     Mental Status: She is alert and oriented to person, place, and time.  Psychiatric:        Mood and Affect: Mood normal.        Behavior: Behavior normal.        Thought Content: Thought content normal.        Judgment: Judgment normal.    Results for orders placed or performed during the hospital encounter of 03/20/23 (from the past 24 hour(s))  POCT urinalysis dipstick     Status: Abnormal   Collection Time: 03/20/23  6:18 PM  Result Value Ref Range   Color, UA yellow yellow   Clarity, UA cloudy (A) clear   Glucose, UA negative negative mg/dL   Bilirubin, UA negative negative   Ketones, POC UA negative negative mg/dL   Spec Grav, UA 4.098 1.191 - 1.025   Blood, UA trace-intact (A) negative   pH, UA 6.0 5.0 - 8.0   Protein Ur, POC negative negative mg/dL   Urobilinogen, UA 0.2 0.2 or 1.0 E.U./dL   Nitrite, UA Positive (A) Negative   Leukocytes, UA Moderate (2+) (A) Negative  POCT urine pregnancy     Status: None   Collection Time: 03/20/23  6:18 PM  Result Value Ref Range   Preg Test, Ur Negative Negative   Assessment and Plan :   PDMP not reviewed this encounter.  1. Acute cystitis with hematuria   2. Dysuria   3. Insomnia, unspecified type   4. Mild persistent asthma without complication     Start Keflex to cover for acute cystitis, urine culture pending.  Use oral fluconazole for antibiotic associated yeast infection.  Recommended aggressive hydration, limiting urinary irritants.  Regarding her insomnia, recommended hydroxyzine as a safe option while she awaits an evaluation with her new primary care provider.  Provided her with information for the [facility].  I  refilled her albuterol inhaler, future refills can be obtained through her new PCP.  As she has no vaginal discharge or concern for STI, will defer the vaginal cytology.  Counseled patient on potential for adverse effects with medications prescribed/recommended today, ER and return-to-clinic precautions discussed, patient verbalized understanding.    Wallis Bamberg, [place] 03/20/23 1836

## 2023-03-23 LAB — URINE CULTURE: Culture: >=100000 — AB

## 2023-04-07 ENCOUNTER — Ambulatory Visit
Admission: EM | Admit: 2023-04-07 | Discharge: 2023-04-07 | Disposition: A | Discharge status: Home / Self Care | Payer: Medicaid Other | Attending: Urgent Care | Admitting: Urgent Care

## 2023-04-07 DIAGNOSIS — B3731 Acute candidiasis of vulva and vagina: Secondary | ICD-10-CM | POA: Diagnosis present

## 2023-04-07 DIAGNOSIS — B9689 Other specified bacterial agents as the cause of diseases classified elsewhere: Secondary | ICD-10-CM | POA: Diagnosis present

## 2023-04-07 DIAGNOSIS — N760 Acute vaginitis: Secondary | ICD-10-CM | POA: Diagnosis present

## 2023-04-07 MED ORDER — FLUCONAZOLE 150 MG PO TABS: 150.0000 mg | ORAL_TABLET | ORAL | 0 refills | Status: DC

## 2023-04-07 MED ORDER — METRONIDAZOLE 500 MG PO TABS: 500.0000 mg | ORAL_TABLET | Freq: Two times a day (BID) | ORAL | 0 refills | Status: DC

## 2023-04-07 NOTE — ED Triage Notes (Signed)
Pt reports vaginal discharge x 2- 3 days.  

## 2023-04-07 NOTE — ED Provider Notes (Signed)
[facility]  Note:  This document was prepared using Conservation officer, historic buildings and may include unintentional dictation errors.  MRN: [id number] DOB: 1996/01/24  Subjective:   Eileen Graham is a 27 y.o. female presenting for 3-day history of recurrent vaginal discharge.  Patient's mother is concerned for recurrent bacterial vaginosis and yeast infection.  She is not concerned for pregnancy.  Would not intend on keeping the pregnancy if she were to become pregnant.  Cannot provide a urine sample in clinic today.  Denies fever, n/v, abdominal pain, pelvic pain, rashes, dysuria, urinary frequency, hematuria.  Is requesting empiric treatment for BV and yeast.   No current facility-administered medications for this encounter.  Current Outpatient Medications:    albuterol (PROVENTIL) (2.5 MG/3ML) 0.083% nebulizer solution, Take 3 mLs (2.5 mg total) by nebulization every 6 (six) hours as needed for wheezing or shortness of breath., Disp: 75 mL, Rfl: 12   albuterol (VENTOLIN HFA) 108 (90 Base) MCG/ACT inhaler, Inhale 1-2 puffs into the lungs every 6 (six) hours as needed for wheezing or shortness of breath., Disp: 18 g, Rfl: 0   cephALEXin (KEFLEX) 500 MG capsule, Take 1 capsule (500 mg total) by mouth 2 (two) times daily., Disp: 10 capsule, Rfl: 0   fluconazole (DIFLUCAN) 150 MG tablet, Take 1 tablet (150 mg total) by mouth once a week., Disp: 2 tablet, Rfl: 0   fluticasone-salmeterol (ADVAIR DISKUS) 250-50 MCG/ACT AEPB, Inhale 1 puff into the lungs in the morning and at bedtime., Disp: 180 each, Rfl: 0   hydrOXYzine (ATARAX) 25 MG tablet, Take 0.5 - 1 tablet at bedtime as needed for insomnia., Disp: 30 tablet, Rfl: 0   levocetirizine (XYZAL) 5 MG tablet, Take 1 tablet (5 mg total) by mouth every evening., Disp: 30 tablet, Rfl: 0   metroNIDAZOLE (FLAGYL) 500 MG tablet, Take 1 tablet (500 mg total) by mouth 2 (two) times daily., Disp: 14 tablet, Rfl: 0    montelukast (SINGULAIR) 10 MG tablet, Take 10 mg by mouth at bedtime., Disp: , Rfl:    naproxen (NAPROSYN) 500 MG tablet, Take 1 tablet (500 mg total) by mouth 2 (two) times daily with a meal., Disp: 30 tablet, Rfl: 0   predniSONE (DELTASONE) 50 MG tablet, Take 1 tablet (50 mg total) by mouth daily with breakfast., Disp: 5 tablet, Rfl: 0   promethazine-dextromethorphan (PROMETHAZINE-DM) 6.25-15 MG/5ML syrup, Take 2.5 mLs by mouth 3 (three) times daily as needed for cough., Disp: 100 mL, Rfl: 0   No Known Allergies  Past Medical History:  Diagnosis Date   Alopecia    Asthma    Headache    Infection    UTI   Shingles    Trichomonas infection      Past Surgical History:  Procedure Laterality Date   abortion     x2   NO PAST SURGERIES      Family History  Problem Relation Age of Onset   Hypertension Mother    Healthy Father    Asthma Maternal Grandfather    Congestive Heart Failure Maternal Grandfather     Social History   Tobacco Use   Smoking status: Former    Types: Cigarettes   Smokeless tobacco: Never   Tobacco comments:    "years ago"  Vaping Use   Vaping Use: Former   Devices: stopped with +UPT  Substance Use Topics   Alcohol use: Yes    Comment: occ   Drug use: Not Currently  ROS   Objective:   Vitals: BP 111/68 (BP Location: Right Arm)   Pulse 90   Temp 98.1 F (36.7 C) (Oral)   Resp 16   LMP 03/13/2023 (Approximate)   SpO2 98%   Breastfeeding Yes   Physical Exam Constitutional:      General: She is not in acute distress.    Appearance: Normal appearance. She is well-developed. She is not ill-appearing, toxic-appearing or diaphoretic.  HENT:     Head: Normocephalic and atraumatic.     Nose: Nose normal.     Mouth/Throat:     Mouth: Mucous membranes are moist.     Pharynx: Oropharynx is clear.  Eyes:     General: No scleral icterus.       Right eye: No discharge.        Left eye: No discharge.     Extraocular Movements: Extraocular  movements intact.     Conjunctiva/sclera: Conjunctivae normal.  Cardiovascular:     Rate and Rhythm: Normal rate.  Pulmonary:     Effort: Pulmonary effort is normal.  Abdominal:     General: Bowel sounds are normal. There is no distension.     Palpations: Abdomen is soft. There is no mass.     Tenderness: There is no abdominal tenderness. There is no right CVA tenderness, left CVA tenderness, guarding or rebound.  Skin:    General: Skin is warm and dry.  Neurological:     General: No focal deficit present.     Mental Status: She is alert and oriented to person, place, and time.  Psychiatric:        Mood and Affect: Mood normal.        Behavior: Behavior normal.        Thought Content: Thought content normal.        Judgment: Judgment normal.     Assessment and Plan :   PDMP not reviewed this encounter.  1. Bacterial vaginosis   2. Yeast vaginitis    We will treat patient empirically for bacterial vaginosis with Flagyl and for yeast vaginitis with fluconazole.  Labs pending. Counseled patient on potential for adverse effects with medications prescribed/recommended today, ER and return-to-clinic precautions discussed, patient verbalized understanding.     Wallis Bamberg, [place] 04/07/23 [id number]

## 2023-04-08 LAB — CERVICOVAGINAL ANCILLARY ONLY
Bacterial Vaginitis (gardnerella): POSITIVE — AB
Candida Glabrata: NEGATIVE
Candida Vaginitis: NEGATIVE
Chlamydia: NEGATIVE
Comment: NEGATIVE
Comment: NEGATIVE
Comment: NEGATIVE
Comment: NEGATIVE
Comment: NEGATIVE
Comment: NORMAL
Neisseria Gonorrhea: NEGATIVE
Trichomonas: NEGATIVE

## 2023-06-26 ENCOUNTER — Telehealth: Payer: Self-pay | Admitting: Family Medicine

## 2023-06-26 DIAGNOSIS — Z3009 Encounter for other general counseling and advice on contraception: Secondary | ICD-10-CM

## 2023-06-26 NOTE — Telephone Encounter (Signed)
Patient needs a refill on her birth control until her gyn appointment.

## 2023-06-30 MED ORDER — NORGESTIM-ETH ESTRAD TRIPHASIC 0.18/0.215/0.25 MG-25 MCG PO TABS: 1.0000 | ORAL_TABLET | Freq: Every day | ORAL | 0 refills | Status: DC

## 2023-06-30 NOTE — Telephone Encounter (Signed)
Called patient and informed of refill sent to pharmacy and year worth of refills can be sent in when we see her in a couple weeks. Patient verbalized understanding.

## 2023-07-28 ENCOUNTER — Encounter: Payer: Self-pay | Admitting: Obstetrics and Gynecology

## 2023-07-28 ENCOUNTER — Ambulatory Visit: Payer: Medicaid Other | Admitting: Obstetrics and Gynecology

## 2023-07-28 ENCOUNTER — Other Ambulatory Visit: Payer: Self-pay

## 2023-07-28 ENCOUNTER — Other Ambulatory Visit (HOSPITAL_COMMUNITY)
Admission: RE | Admit: 2023-07-28 | Discharge: 2023-07-28 | Disposition: A | Discharge status: Home / Self Care | Payer: Medicaid Other | Source: Ambulatory Visit | Attending: Obstetrics and Gynecology | Admitting: Obstetrics and Gynecology

## 2023-07-28 VITALS — BP 134/87 | HR 88 | Wt 158.4 lb

## 2023-07-28 DIAGNOSIS — N898 Other specified noninflammatory disorders of vagina: Secondary | ICD-10-CM | POA: Diagnosis not present

## 2023-07-28 DIAGNOSIS — J45909 Unspecified asthma, uncomplicated: Secondary | ICD-10-CM

## 2023-07-28 DIAGNOSIS — Z3009 Encounter for other general counseling and advice on contraception: Secondary | ICD-10-CM | POA: Diagnosis not present

## 2023-07-28 DIAGNOSIS — Z0000 Encounter for general adult medical examination without abnormal findings: Secondary | ICD-10-CM | POA: Diagnosis not present

## 2023-07-28 DIAGNOSIS — Z113 Encounter for screening for infections with a predominantly sexual mode of transmission: Secondary | ICD-10-CM | POA: Diagnosis present

## 2023-07-28 LAB — POCT URINALYSIS DIP (DEVICE)
Bilirubin Urine: NEGATIVE
Glucose, UA: NEGATIVE mg/dL
Hgb urine dipstick: NEGATIVE
Ketones, ur: NEGATIVE mg/dL
Leukocytes,Ua: NEGATIVE
Nitrite: NEGATIVE
Protein, ur: NEGATIVE mg/dL
Specific Gravity, Urine: >=1.03 (ref 1.005–1.030)
Urobilinogen, UA: 0.2 mg/dL (ref 0.0–1.0)
pH: 6 (ref 5.0–8.0)

## 2023-07-28 MED ORDER — ALBUTEROL SULFATE HFA 108 (90 BASE) MCG/ACT IN AERS: 1.0000 | INHALATION_SPRAY | Freq: Four times a day (QID) | RESPIRATORY_TRACT | 0 refills | Status: DC | PRN

## 2023-07-28 MED ORDER — NORGESTIM-ETH ESTRAD TRIPHASIC 0.18/0.215/0.25 MG-25 MCG PO TABS: 1.0000 | ORAL_TABLET | Freq: Every day | ORAL | 3 refills | Status: DC

## 2023-07-28 NOTE — Progress Notes (Signed)
ANNUAL EXAM Patient name: Eileen Graham MRN [id number]  Date of birth: July 27, 1996 Chief Complaint:   Gynecologic Exam  History of Present Illness:   Eileen Graham is a 27 y.o. [phone]  female being seen today for a routine annual exam.  Current complaints: had a TAB 6/29, and then started OCPs 2-3 wks, takes continuously. Reports some spotting  odor and cloudy urine. Denies itching/irritation/frequency  No LMP recorded. (Menstrual status: Oral contraceptives).   Upstream - 07/28/23 0835       Pregnancy Intention Screening   Does the patient want to become pregnant in the next year? No    Does the patient's partner want to become pregnant in the next year? No    Would the patient like to discuss contraceptive options today? No      Contraception Wrap Up   Current Method Oral Contraceptive    End Method Oral Contraceptive    How was the end contraceptive method provided? Prescription            The pregnancy intention screening data noted above was reviewed. Potential methods of contraception were discussed. The patient elected to proceed with Oral Contraceptive.   Last pap 03/21/21. Results were: ASCUS w/ HRHPV negative. H/O abnormal pap: no Last mammogram: n/a. Results were: N/A. Family h/o breast cancer: no      07/28/2023    9:46 AM 07/02/2021    1:42 PM 04/17/2021   10:03 AM 03/21/2021   10:11 AM 03/06/2021    2:15 PM  Depression screen PHQ 2/9  Decreased Interest 0 0 0 0 0  Down, Depressed, Hopeless 0 0 0 0 0  PHQ - 2 Score 0 0 0 0 0  Altered sleeping 0 0 0 0 1  Tired, decreased energy 0 0 0 0 0  Change in appetite 0 0 0 1 1  Feeling bad or failure about yourself  0 0 0 0 0  Trouble concentrating 0 0 0 0 0  Moving slowly or fidgety/restless 0 0 0 0 0  Suicidal thoughts 0 0 0 0 0  PHQ-9 Score 0 0 0 1 2  Difficult doing work/chores    Not difficult at all         07/28/2023    9:46 AM 03/21/2021   10:11 AM 03/06/2021    2:16 PM  GAD 7 :  Generalized Anxiety Score  Nervous, Anxious, on Edge 0 0 0  Control/stop worrying 0 0 0  Worry too much - different things 0 0 0  Trouble relaxing 0 0 0  Restless 0 0 0  Easily annoyed or irritable 0 0 1  Afraid - awful might happen 0 0 0  Total GAD 7 Score 0 0 1     Review of Systems:   Pertinent items are noted in HPI Denies any headaches, blurred vision, fatigue, shortness of breath, chest pain, abdominal pain, abnormal vaginal discharge/itching/odor/irritation, problems with periods, bowel movements, urination, or intercourse unless otherwise stated above. Pertinent History Reviewed:  Reviewed past medical,surgical, social and family history.  Reviewed problem list, medications and allergies. Physical Assessment:   Vitals:   07/28/23 0834  BP: 134/87  Pulse: 88  Weight: 158 lb 6.4 oz (71.8 kg)  Body mass index is 28.06 kg/m.        Physical Examination:   General appearance - well appearing, and in no distress  Mental status - alert, oriented to person, place, and time  Psych:  She has a normal  mood and affect  Skin - warm and dry  Chest - effort normal  Heart - normal rate and regular rhythm  Neck:  midline trachea  Breasts - deferred  Abdomen - soft  Pelvic - deferred  Extremities:  No swelling or varicosities noted  Chaperone present for exam  Results for orders placed or performed in visit on 07/28/23 (from the past 24 hour(s))  POCT urinalysis dip (device)   Collection Time: 07/28/23  8:59 AM  Result Value Ref Range   Glucose, UA NEGATIVE NEGATIVE mg/dL   Bilirubin Urine NEGATIVE NEGATIVE   Ketones, ur NEGATIVE NEGATIVE mg/dL   Specific Gravity, Urine >=1.030 1.005 - 1.030   Hgb urine dipstick NEGATIVE NEGATIVE   pH 6.0 5.0 - 8.0   Protein, ur NEGATIVE NEGATIVE mg/dL   Urobilinogen, UA 0.2 0.0 - 1.0 mg/dL   Nitrite NEGATIVE NEGATIVE   Leukocytes,Ua NEGATIVE NEGATIVE    Assessment & Plan:  1. Birth control counseling Would like to continue OCPs, rx  sent  - Norgestimate-Ethinyl Estradiol Triphasic 0.18/0.215/0.25 MG-25 MCG tab; Take 1 tablet by mouth daily.  Dispense: 90 tablet; Refill: 3  2. Screen for STD (sexually transmitted disease) - cervicovaginal ancillary only ( [place]) - RPR - Hepatitis C Antibody - Hepatitis B Surface AntiGEN - HIV antibody (with reflex)  3. Vaginal odor  Checking swab, UA neg - Cervicovaginal ancillary only( [place])  3. Persistent asthma without complication, unspecified asthma severity Needed refill, rx sent  - albuterol (VENTOLIN HFA) 108 (90 Base) MCG/ACT inhaler; Inhale 1-2 puffs into the lungs every 6 (six) hours as needed for wheezing or shortness of breath.  Dispense: 18 g; Refill: 0   Labs/procedures today:   Mammogram: @ 29yo, or sooner if problems Colonoscopy: @ 29yo, or sooner if problems  Orders Placed This Encounter  Procedures   RPR   Hepatitis C Antibody   Hepatitis B Surface AntiGEN   HIV antibody (with reflex)   POCT urinalysis dip (device)    Meds:  Meds ordered this encounter  Medications   Norgestimate-Ethinyl Estradiol Triphasic 0.18/0.215/0.25 MG-25 MCG tab    Sig: Take 1 tablet by mouth daily.    Dispense:  90 tablet    Refill:  3   albuterol (VENTOLIN HFA) 108 (90 Base) MCG/ACT inhaler    Sig: Inhale 1-2 puffs into the lungs every 6 (six) hours as needed for wheezing or shortness of breath.    Dispense:  18 g    Refill:  0    Follow-up: Return in about 8 months (around 03/26/2024), or PAP.  Albertine Grates, FNP

## 2023-07-29 LAB — CERVICOVAGINAL ANCILLARY ONLY
Bacterial Vaginitis (gardnerella): POSITIVE — AB
Candida Glabrata: NEGATIVE
Candida Vaginitis: NEGATIVE
Chlamydia: NEGATIVE
Comment: NEGATIVE
Comment: NEGATIVE
Comment: NEGATIVE
Comment: NEGATIVE
Comment: NEGATIVE
Comment: NORMAL
Neisseria Gonorrhea: NEGATIVE
Trichomonas: NEGATIVE

## 2023-07-29 LAB — HIV ANTIBODY (ROUTINE TESTING W REFLEX): HIV Screen 4th Generation wRfx: NONREACTIVE

## 2023-07-29 LAB — RPR: RPR Ser Ql: NONREACTIVE

## 2023-07-29 LAB — HEPATITIS C ANTIBODY: Hep C Virus Ab: NONREACTIVE

## 2023-07-29 LAB — HEPATITIS B SURFACE ANTIGEN: Hepatitis B Surface Ag: NEGATIVE

## 2023-07-29 MED ORDER — METRONIDAZOLE 500 MG PO TABS: 500.0000 mg | ORAL_TABLET | Freq: Two times a day (BID) | ORAL | 0 refills | Status: AC

## 2023-07-29 NOTE — Addendum Note (Signed)
Addended by: Sue Lush on: 07/29/2023 04:09 PM   Modules accepted: Orders

## 2023-07-30 ENCOUNTER — Telehealth: Payer: Medicaid Other | Admitting: Physician Assistant

## 2023-07-30 DIAGNOSIS — T3695XA Adverse effect of unspecified systemic antibiotic, initial encounter: Secondary | ICD-10-CM | POA: Diagnosis not present

## 2023-07-30 DIAGNOSIS — B379 Candidiasis, unspecified: Secondary | ICD-10-CM | POA: Diagnosis not present

## 2023-07-30 MED ORDER — FLUCONAZOLE 150 MG PO TABS: ORAL_TABLET | ORAL | 0 refills | Status: DC

## 2023-07-30 NOTE — Progress Notes (Signed)
Virtual Visit Consent   Eileen Graham, you are scheduled for a virtual visit with a [facility] provider today. Just as with appointments in the office, your consent must be obtained to participate. Your consent will be active for this visit and any virtual visit you may have with one of our providers in the next 365 days. If you have a MyChart account, a copy of this consent can be sent to you electronically.  As this is a virtual visit, video technology does not allow for your provider to perform a traditional examination. This may limit your provider's ability to fully assess your condition. If your provider identifies any concerns that need to be evaluated in person or the need to arrange testing (such as labs, EKG, etc.), we will make arrangements to do so. Although advances in technology are sophisticated, we cannot ensure that it will always work on either your end or our end. If the connection with a video visit is poor, the visit may have to be switched to a telephone visit. With either a video or telephone visit, we are not always able to ensure that we have a secure connection.  By engaging in this virtual visit, you consent to the provision of healthcare and authorize for your insurance to be billed (if applicable) for the services provided during this visit. Depending on your insurance coverage, you may receive a charge related to this service.  I need to obtain your verbal consent now. Are you willing to proceed with your visit today? Eileen Graham has provided verbal consent on 07/30/2023 for a virtual visit (video or telephone). Piedad Climes, [place]  Date: 07/30/2023 6:10 PM  Virtual Visit via Video Note   I, Piedad Climes, connected with  Eileen Graham  ([id number], May 11, 1996) on 07/30/23 at  6:30 PM EDT by a video-enabled telemedicine application and verified that I am speaking with the correct person using two identifiers.  Location: Patient:  Virtual Visit Location Patient: Home Provider: Virtual Visit Location Provider: Home Office   I discussed the limitations of evaluation and management by telemedicine and the availability of in person appointments. The patient expressed understanding and agreed to proceed.    History of Present Illness: Eileen Graham is a 27 y.o. who identifies as a female who was assigned female at birth, and is being seen today for concern of antibiotic-induced yeast infection. Is currently on a regimen of metronidazole for bv that she tested positive for ay annual with her GYN. Notes she always gets a secondary yeast infection with use of metronidazole and is requesting a script for Diflucan just in case.   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 Repeat in 3 days if needed., Disp: 2 tablet, Rfl: 0   albuterol (PROVENTIL) (2.5 MG/3ML) 0.083% nebulizer solution, Take 3 mLs (2.5 mg total) by nebulization every 6 (six) hours as needed for wheezing or shortness of breath., Disp: 75 mL, Rfl: 12   albuterol (VENTOLIN HFA) 108 (90 Base) MCG/ACT inhaler, Inhale 1-2 puffs into the lungs every 6 (six) hours as needed for wheezing or shortness of breath., Disp: 18 g, Rfl: 0   hydrOXYzine (ATARAX)  25 MG tablet, Take 0.5 - 1 tablet at bedtime as needed for insomnia., Disp: 30 tablet, Rfl: 0   metroNIDAZOLE (FLAGYL) 500 MG tablet, Take 1 tablet (500 mg total) by mouth 2 (two) times daily for 7 days., Disp: 14 tablet, Rfl: 0   montelukast (SINGULAIR) 10 MG tablet, Take 10 mg by mouth at bedtime. (Patient not taking: Reported on 07/28/2023), Disp: , Rfl:    Norgestimate-Ethinyl Estradiol  Triphasic 0.18/0.215/0.25 MG-25 MCG tab, Take 1 tablet by mouth daily., Disp: 90 tablet, Rfl: 3  Observations/Objective: Patient is well-developed, well-nourished in no acute distress.  Resting comfortably at home.  Head is normocephalic, atraumatic.  No labored breathing. Speech is clear and coherent with logical content.  Patient is alert and oriented at baseline.   Assessment and Plan: 1. Antibiotic-induced yeast infection - fluconazole (DIFLUCAN) 150 MG tablet; Take 1 tablet PO once. Repeat in 3 days if needed.  Dispense: 2 tablet; Refill: 0  Currently asymptomatic for this but has a huge history, especially while taking Flagyl. Will sed in a 2-dose course of Diflucan to use as directed, if noting symptoms of yeast infections starting. Supportive measures and OTC medications reviewed.   Follow Up Instructions: I discussed the assessment and treatment plan with the patient. The patient was provided an opportunity to ask questions and all were answered. The patient agreed with the plan and demonstrated an understanding of the instructions.  A copy of instructions were sent to the patient via MyChart unless otherwise noted below.   The patient was advised to call back or seek an in-person evaluation if the symptoms worsen or if the condition fails to improve as anticipated.  Time:  I spent 10 minutes with the patient via telehealth technology discussing the above problems/concerns.    Piedad Climes, PA-C

## 2023-07-30 NOTE — Patient Instructions (Signed)
Eileen Graham, thank you for joining Piedad Climes, PA-C for today's virtual visit.  While this provider is not your primary care provider (PCP), if your PCP is located in our provider database this encounter information will be shared with them immediately following your visit.   A [facility] MyChart account gives you access to today's visit and all your visits, tests, and labs performed at [facility] " click here if you don't have a [facility] MyChart account or go to [email or web address]  Consent: (Patient) Eileen Graham provided verbal consent for this virtual visit at the beginning of the encounter.  Current Medications:  Current Outpatient Medications:    albuterol (PROVENTIL) (2.5 MG/3ML) 0.083% nebulizer solution, Take 3 mLs (2.5 mg total) by nebulization every 6 (six) hours as needed for wheezing or shortness of breath., Disp: 75 mL, Rfl: 12   albuterol (VENTOLIN HFA) 108 (90 Base) MCG/ACT inhaler, Inhale 1-2 puffs into the lungs every 6 (six) hours as needed for wheezing or shortness of breath., Disp: 18 g, Rfl: 0   hydrOXYzine (ATARAX) 25 MG tablet, Take 0.5 - 1 tablet at bedtime as needed for insomnia., Disp: 30 tablet, Rfl: 0   metroNIDAZOLE (FLAGYL) 500 MG tablet, Take 1 tablet (500 mg total) by mouth 2 (two) times daily for 7 days., Disp: 14 tablet, Rfl: 0   montelukast (SINGULAIR) 10 MG tablet, Take 10 mg by mouth at bedtime. (Patient not taking: Reported on 07/28/2023), Disp: , Rfl:    Norgestimate-Ethinyl Estradiol Triphasic 0.18/0.215/0.25 MG-25 MCG tab, Take 1 tablet by mouth daily., Disp: 90 tablet, Rfl: 3   Medications ordered in this encounter:  No orders of the defined types were placed in this encounter.    *If you need refills on other medications prior to your next appointment, please contact your pharmacy*  Follow-Up: Call back or seek an in-person evaluation if the symptoms worsen or if the condition fails to improve as  anticipated.  [facility] [phone]  Other Instructions Vaginal Yeast Infection, Adult  Vaginal yeast infection is a condition that causes vaginal discharge as well as soreness, swelling, and redness (inflammation) of the vagina. This is a common condition. Some women get this infection frequently. What are the causes? This condition is caused by a change in the normal balance of the yeast (Candida) and normal bacteria that live in the vagina. This change causes an overgrowth of yeast, which causes the inflammation. What increases the risk? The condition is more likely to develop in women who: Take antibiotic medicines. Have diabetes. Take birth control pills. Are pregnant. Douche often. Have a weak body defense system (immune system). Have been taking steroid medicines for a long time. Frequently wear tight clothing. What are the signs or symptoms? Symptoms of this condition include: White, thick, creamy vaginal discharge. Swelling, itching, redness, and irritation of the vagina. The lips of the vagina (labia) may be affected as well. Pain or a burning feeling while urinating. Pain during sex. How is this diagnosed? This condition is diagnosed based on: Your medical history. A physical exam. A pelvic exam. Your health care provider will examine a sample of your vaginal discharge under a microscope. Your health care provider may send this sample for testing to confirm the diagnosis. How is this treated? This condition is treated with medicine. Medicines may be over-the-counter or prescription. You may be told to use one or more of the following: Medicine that is taken by mouth (orally). Medicine that is  applied as a cream (topically). Medicine that is inserted directly into the vagina (suppository). Follow these instructions at home: Take or apply over-the-counter and prescription medicines only as told by your health care provider. Do not use tampons until  your health care provider approves. Do not have sex until your infection has cleared. Sex can prolong or worsen your symptoms of infection. Ask your health care provider when it is safe to resume sexual activity. Keep all follow-up visits. This is important. How is this prevented?  Do not wear tight clothes, such as pantyhose or tight pants. Wear breathable cotton underwear. Do not use douches, perfumed soap, creams, or powders. Wipe from front to back after using the toilet. If you have diabetes, keep your blood sugar levels under control. Ask your health care provider for other ways to prevent yeast infections. Contact a health care provider if: You have a fever. Your symptoms go away and then return. Your symptoms do not get better with treatment. Your symptoms get worse. You have new symptoms. You develop blisters in or around your vagina. You have blood coming from your vagina and it is not your menstrual period. You develop pain in your abdomen. Summary Vaginal yeast infection is a condition that causes discharge as well as soreness, swelling, and redness (inflammation) of the vagina. This condition is treated with medicine. Medicines may be over-the-counter or prescription. Take or apply over-the-counter and prescription medicines only as told by your health care provider. Do not douche. Resume sexual activity or use of tampons as instructed by your health care provider. Contact a health care provider if your symptoms do not get better with treatment or your symptoms go away and then return. This information is not intended to replace advice given to you by your health care provider. Make sure you discuss any questions you have with your health care provider. Document Revised: 01/08/2021 Document Reviewed: 01/08/2021 Elsevier Patient Education  2024 Elsevier Inc.    If you have been instructed to have an in-person evaluation today at a local Urgent Care facility, please use  the link below. It will take you to a list of all of our available [facility], including address, phone number and hours of operation. Please do not delay care.  [facility]  If you or a family member do not have a primary care provider, use the link below to schedule a visit and establish care. When you choose a [facility] primary care physician or advanced practice provider, you gain a long-term partner in health. Find a Primary Care Provider  Learn more about [facility]'s in-office and virtual care options: [facility] - Get Care Now

## 2023-07-31 ENCOUNTER — Encounter: Payer: Self-pay | Admitting: Family Medicine

## 2023-07-31 ENCOUNTER — Inpatient Hospital Stay (HOSPITAL_COMMUNITY)
Admission: AD | Admit: 2023-07-31 | Discharge: 2023-07-31 | Disposition: A | Discharge status: Home / Self Care | Payer: Medicaid Other | Attending: Obstetrics and Gynecology | Admitting: Obstetrics and Gynecology

## 2023-07-31 ENCOUNTER — Inpatient Hospital Stay (HOSPITAL_COMMUNITY): Payer: Medicaid Other

## 2023-07-31 DIAGNOSIS — O071 Delayed or excessive hemorrhage following failed attempted termination of pregnancy: Secondary | ICD-10-CM | POA: Diagnosis present

## 2023-07-31 DIAGNOSIS — O074 Failed attempted termination of pregnancy without complication: Secondary | ICD-10-CM | POA: Diagnosis not present

## 2023-07-31 DIAGNOSIS — Z3A00 Weeks of gestation of pregnancy not specified: Secondary | ICD-10-CM | POA: Diagnosis not present

## 2023-07-31 DIAGNOSIS — Z3200 Encounter for pregnancy test, result unknown: Secondary | ICD-10-CM | POA: Diagnosis present

## 2023-07-31 DIAGNOSIS — O0281 Inappropriate change in quantitative human chorionic gonadotropin (hCG) in early pregnancy: Secondary | ICD-10-CM | POA: Diagnosis not present

## 2023-07-31 DIAGNOSIS — N939 Abnormal uterine and vaginal bleeding, unspecified: Secondary | ICD-10-CM | POA: Diagnosis present

## 2023-07-31 LAB — COMPREHENSIVE METABOLIC PANEL
ALT: 11 U/L (ref 0–44)
AST: 14 U/L — ABNORMAL LOW (ref 15–41)
Albumin: 3.8 g/dL (ref 3.5–5.0)
Alkaline Phosphatase: 32 U/L — ABNORMAL LOW (ref 38–126)
Anion gap: 12 (ref 5–15)
BUN: 12 mg/dL (ref 6–20)
CO2: 20 mmol/L — ABNORMAL LOW (ref 22–32)
Calcium: 8.7 mg/dL — ABNORMAL LOW (ref 8.9–10.3)
Chloride: 104 mmol/L (ref 98–111)
Creatinine, Ser: 0.62 mg/dL (ref 0.44–1.00)
GFR, Estimated: >60 mL/min (ref >60)
Glucose, Bld: 96 mg/dL (ref 70–99)
Potassium: 3.9 mmol/L (ref 3.5–5.1)
Sodium: 136 mmol/L (ref 135–145)
Total Bilirubin: 0.3 mg/dL (ref 0.3–1.2)
Total Protein: 6.7 g/dL (ref 6.5–8.1)

## 2023-07-31 LAB — CBC
HCT: 33 % — ABNORMAL LOW (ref 36.0–46.0)
Hemoglobin: 11 g/dL — ABNORMAL LOW (ref 12.0–15.0)
MCH: 31.5 pg (ref 26.0–34.0)
MCHC: 33.3 g/dL (ref 30.0–36.0)
MCV: 94.6 fL (ref 80.0–100.0)
Platelets: 205 10*3/uL (ref 150–400)
RBC: 3.49 MIL/uL — ABNORMAL LOW (ref 3.87–5.11)
RDW: 11.9 % (ref 11.5–15.5)
WBC: 7.2 10*3/uL (ref 4.0–10.5)
nRBC: 0 % (ref 0.0–0.2)

## 2023-07-31 LAB — POCT PREGNANCY, URINE: Preg Test, Ur: POSITIVE — AB

## 2023-07-31 LAB — HCG, QUANTITATIVE, PREGNANCY: hCG, Beta Chain, Quant, S: 19 m[IU]/mL — ABNORMAL HIGH (ref <5)

## 2023-07-31 NOTE — MAU Provider Note (Signed)
History     CSN: [id number]  Arrival date and time: 07/31/23 1543   Event Date/Time   First Provider Initiated Contact with Patient 07/31/23 1706      Chief Complaint  Patient presents with   Vaginal Bleeding   Eileen Graham is a 27 y.o. [id number] at.  She presents today for vaginal bleeding.  She states she has been experiencing heavy bleeding with clots (mandarin size) that started today.  She reports recently having a TAB in July 29, but had no follow up care. She reports she bleed for one week and then it resided.  She states she has been sexually active and has been using pills since her procedure.  Patient reports cramping, but states it is mild. She states she did not have any follow up care after her procedure.   OB History     Gravida  5   Para  2   Term  2   Preterm  0   AB  3   Living  2      SAB  0   IAB  3   Ectopic  0   Multiple  0   Live Births  2           Past Medical History:  Diagnosis Date   Alopecia    Asthma    Headache    Hyperemesis gravidarum 08/10/2019   Infection    UTI   Shingles    Supervision of high-risk pregnancy 03/06/2021              Nursing Staff    Provider      Office Location     [facility]    Dating     LMP      Language     English    Anatomy US     WNL with EIF       Flu Vaccine     NA    Genetic/Carrier Screen     NIPS: low risk   AFP:     [place]: neg 4/4      TDaP Vaccine      08/07/21    Hgb A1C or   GTT    Early   Third trimester neg      COVID Vaccine    No         LAB RESULTS       Rhogam     NA    Blood T   Trichomonas infection     Past Surgical History:  Procedure Laterality Date   abortion     x2   NO PAST SURGERIES      Family History  Problem Relation Age of Onset   Hypertension Mother    Healthy Father    Asthma Maternal Grandfather    Congestive Heart Failure Maternal Grandfather     Social History   Tobacco Use   Smoking status: Former    Types: Cigarettes   Smokeless tobacco:  Never   Tobacco comments:    "years ago"  Vaping Use   Vaping status: Former   Devices: stopped with +UPT  Substance Use Topics   Alcohol use: Yes    Comment: occ   Drug use: Not Currently    Allergies: No Known Allergies  Medications Prior to Admission  Medication Sig Dispense Refill Last Dose   albuterol (PROVENTIL) (2.5 MG/3ML) 0.083% nebulizer solution Take 3 mLs (2.5 mg total) by nebulization every 6 (six) hours as needed  for wheezing or shortness of breath. 75 mL 12    albuterol (VENTOLIN HFA) 108 (90 Base) MCG/ACT inhaler Inhale 1-2 puffs into the lungs every 6 (six) hours as needed for wheezing or shortness of breath. 18 g 0    fluconazole (DIFLUCAN) 150 MG tablet Take 1 tablet PO once. Repeat in 3 days if needed. 2 tablet 0    hydrOXYzine (ATARAX) 25 MG tablet Take 0.5 - 1 tablet at bedtime as needed for insomnia. 30 tablet 0    metroNIDAZOLE (FLAGYL) 500 MG tablet Take 1 tablet (500 mg total) by mouth 2 (two) times daily for 7 days. 14 tablet 0    montelukast (SINGULAIR) 10 MG tablet Take 10 mg by mouth at bedtime. (Patient not taking: Reported on 07/28/2023)      Norgestimate-Ethinyl Estradiol Triphasic 0.18/0.215/0.25 MG-25 MCG tab Take 1 tablet by mouth daily. 90 tablet 3     Review of Systems  Gastrointestinal:  Negative for constipation, diarrhea, nausea and vomiting.  Genitourinary:  Positive for vaginal bleeding. Negative for difficulty urinating, dysuria and vaginal discharge.  Neurological:  Negative for dizziness, light-headedness and headaches.   Physical Exam   Blood pressure 135/79, pulse 92, temperature 98.2 F (36.8 C), resp. rate 18, height 5\' 3"  (1.6 m), weight 71.2 kg, currently breastfeeding.  Physical Exam Vitals reviewed.  Constitutional:      Appearance: Normal appearance.  HENT:     Head: Normocephalic and atraumatic.  Eyes:     Conjunctiva/sclera: Conjunctivae normal.  Cardiovascular:     Rate and Rhythm: Normal rate.  Pulmonary:      Effort: Pulmonary effort is normal. No respiratory distress.  Musculoskeletal:        General: Normal range of motion.     Cervical back: Normal range of motion.  Skin:    General: Skin is warm and dry.  Neurological:     Mental Status: She is alert and oriented to person, place, and time.  Psychiatric:        Mood and Affect: Mood normal.        Behavior: Behavior normal.     MAU Course  Procedures Results for orders placed or performed during the hospital encounter of 07/31/23 (from the past 24 hour(s))  Pregnancy, urine POC     Status: Abnormal   Collection Time: 07/31/23  4:41 PM  Result Value Ref Range   Preg Test, Ur POSITIVE (A) NEGATIVE  hCG, quantitative, pregnancy     Status: Abnormal   Collection Time: 07/31/23  5:42 PM  Result Value Ref Range   hCG, Beta Chain, Quant, S 19 (H) <5 mIU/mL  CBC     Status: Abnormal   Collection Time: 07/31/23  5:42 PM  Result Value Ref Range   WBC 7.2 4.0 - 10.5 K/uL   RBC 3.49 (L) 3.87 - 5.11 MIL/uL   Hemoglobin 11.0 (L) 12.0 - 15.0 g/dL   HCT 16.1 (L) 09.6 - 04.5 %   MCV 94.6 80.0 - 100.0 fL   MCH 31.5 26.0 - 34.0 pg   MCHC 33.3 30.0 - 36.0 g/dL   RDW 40.9 81.1 - 91.4 %   Platelets 205 150 - 400 K/uL   nRBC 0.0 0.0 - 0.2 %  Comprehensive metabolic panel     Status: Abnormal   Collection Time: 07/31/23  5:42 PM  Result Value Ref Range   Sodium 136 135 - 145 mmol/L   Potassium 3.9 3.5 - 5.1 mmol/L   Chloride 104 98 - 111  mmol/L   CO2 20 (L) 22 - 32 mmol/L   Glucose, Bld 96 70 - 99 mg/dL   BUN 12 6 - 20 mg/dL   Creatinine, Ser 8.11 0.44 - 1.00 mg/dL   Calcium 8.7 (L) 8.9 - 10.3 mg/dL   Total Protein 6.7 6.5 - 8.1 g/dL   Albumin 3.8 3.5 - 5.0 g/dL   AST 14 (L) 15 - 41 U/L   ALT 11 0 - 44 U/L   Alkaline Phosphatase 32 (L) 38 - 126 U/L   Total Bilirubin 0.3 0.3 - 1.2 mg/dL   GFR, Estimated >91 >47 mL/min   Anion gap 12 5 - 15     MDM Labs: CMP, CBC, hCG  Ultrasound Consult Coordination of Follow Up Care Assessment  and Plan  27 year old [id number]  Vaginal Bleeding  -POC Reviewed. -Labs ordered. -Patient offered and declines pain medication. -Patient expresses desire for discharge and informed that if results return straightforward will send home and send results via mychart. -Patient agreeable. -Send for [name] and await results.   Cherre Robins 07/31/2023, 5:06 PM   Reassessment (7:11 PM) -Results and imaging reviewed by Dr. Gildardo Griffes who suspects molar vs retained products.  -Dr. Shawnie Pons to bedside to discuss findings and follow up with patient. -Scheduled for [facility] Friday Sept 27th at  1330.  Patient instructed to arrive at 1130 in [facility]. -Patient reports she needs to leave and requests all information be sent via mychart.  Cherre Robins MSN, CNM Advanced Practice Provider, [facility]

## 2023-07-31 NOTE — MAU Note (Signed)
.  Eileen Graham is a 27 y.o. at Unknown here in MAU reporting: she had a D&C abortion July 27 .  Stated she had bled for a week and then it stopped . Then bled for a day last week. And then started heavy bleeding today. Has not had any follow up since procedure.  Went to Office on 9/23 to have  birth control pills refilled. No record of pregnancy test noted .  LMP: unknown- none since IAB Onset of complaint: today Pain score: 3 Vitals:   07/31/23 1631  BP: 135/79  Pulse: 92  Resp: 18  Temp: 98.2 F (36.8 C)     FHT:n/a Lab orders placed from triage:

## 2023-08-01 ENCOUNTER — Encounter (HOSPITAL_COMMUNITY): Payer: Self-pay | Admitting: Obstetrics and Gynecology

## 2023-08-01 ENCOUNTER — Ambulatory Visit (HOSPITAL_COMMUNITY)
Admission: AD | Admit: 2023-08-01 | Discharge: 2023-08-01 | Disposition: A | Discharge status: Home / Self Care | Payer: Medicaid Other | Source: Ambulatory Visit | Attending: Obstetrics and Gynecology | Admitting: Obstetrics and Gynecology

## 2023-08-01 ENCOUNTER — Encounter (HOSPITAL_COMMUNITY)
Admission: AD | Disposition: A | Discharge status: Home / Self Care | Payer: Self-pay | Source: Ambulatory Visit | Attending: Obstetrics and Gynecology

## 2023-08-01 ENCOUNTER — Inpatient Hospital Stay (HOSPITAL_COMMUNITY): Payer: Medicaid Other | Admitting: Certified Registered Nurse Anesthetist

## 2023-08-01 ENCOUNTER — Other Ambulatory Visit: Payer: Self-pay | Admitting: Obstetrics and Gynecology

## 2023-08-01 ENCOUNTER — Other Ambulatory Visit: Payer: Self-pay

## 2023-08-01 DIAGNOSIS — Z87891 Personal history of nicotine dependence: Secondary | ICD-10-CM | POA: Insufficient documentation

## 2023-08-01 DIAGNOSIS — O020 Blighted ovum and nonhydatidiform mole: Secondary | ICD-10-CM | POA: Insufficient documentation

## 2023-08-01 DIAGNOSIS — O074 Failed attempted termination of pregnancy without complication: Secondary | ICD-10-CM | POA: Diagnosis not present

## 2023-08-01 DIAGNOSIS — O019 Hydatidiform mole, unspecified: Secondary | ICD-10-CM

## 2023-08-01 DIAGNOSIS — Z3A00 Weeks of gestation of pregnancy not specified: Secondary | ICD-10-CM

## 2023-08-01 DIAGNOSIS — Z01818 Encounter for other preprocedural examination: Secondary | ICD-10-CM

## 2023-08-01 DIAGNOSIS — J45909 Unspecified asthma, uncomplicated: Secondary | ICD-10-CM | POA: Diagnosis not present

## 2023-08-01 DIAGNOSIS — Z9889 Other specified postprocedural states: Principal | ICD-10-CM

## 2023-08-01 HISTORY — PX: DILATION AND EVACUATION: SHX1459

## 2023-08-01 LAB — HCG, QUANTITATIVE, PREGNANCY: hCG, Beta Chain, Quant, S: 20 m[IU]/mL — ABNORMAL HIGH (ref <5)

## 2023-08-01 LAB — TYPE AND SCREEN
ABO/RH(D): O POS
Antibody Screen: NEGATIVE

## 2023-08-01 SURGERY — DILATION AND EVACUATION, UTERUS
Anesthesia: General | Site: Vagina

## 2023-08-01 SURGERY — DILATION AND CURETTAGE
Anesthesia: General

## 2023-08-01 MED ORDER — ONDANSETRON HCL 4 MG/2ML IJ SOLN
INTRAMUSCULAR | Status: DC | PRN
  Administered 2023-08-01: 4 mg via INTRAVENOUS

## 2023-08-01 MED ORDER — DEXAMETHASONE SODIUM PHOSPHATE 10 MG/ML IJ SOLN
INTRAMUSCULAR | Status: DC | PRN
  Administered 2023-08-01: 10 mg via INTRAVENOUS

## 2023-08-01 MED ORDER — KETOROLAC TROMETHAMINE 30 MG/ML IJ SOLN
INTRAMUSCULAR | Status: DC | PRN
Start: 2023-08-01 — End: 2023-08-01
  Administered 2023-08-01: 30 mg via INTRAVENOUS

## 2023-08-01 MED ORDER — PROPOFOL 10 MG/ML IV BOLUS
INTRAVENOUS | Status: DC | PRN
  Administered 2023-08-01: 200 mg via INTRAVENOUS

## 2023-08-01 MED ORDER — DEXAMETHASONE SODIUM PHOSPHATE 10 MG/ML IJ SOLN
INTRAMUSCULAR | Status: AC
  Filled 2023-08-01: qty 1

## 2023-08-01 MED ORDER — ACETAMINOPHEN 500 MG PO TABS
1000.0000 mg | ORAL_TABLET | Freq: Once | ORAL | Status: AC
  Administered 2023-08-01: 1000 mg via ORAL
  Filled 2023-08-01: qty 2

## 2023-08-01 MED ORDER — ONDANSETRON HCL 4 MG/2ML IJ SOLN
INTRAMUSCULAR | Status: AC
  Filled 2023-08-01: qty 2

## 2023-08-01 MED ORDER — ORAL CARE MOUTH RINSE: 15.0000 mL | Freq: Once | OROMUCOSAL | Status: AC

## 2023-08-01 MED ORDER — OXYCODONE HCL 5 MG PO TABS
ORAL_TABLET | ORAL | Status: AC
  Filled 2023-08-01: qty 1

## 2023-08-01 MED ORDER — HEATING PADS PADS: 1.0000 | MEDICATED_PAD | Status: AC | PRN

## 2023-08-01 MED ORDER — FENTANYL CITRATE (PF) 250 MCG/5ML IJ SOLN
INTRAMUSCULAR | Status: AC
  Filled 2023-08-01: qty 5

## 2023-08-01 MED ORDER — CHLORHEXIDINE GLUCONATE 0.12 % MT SOLN
15.0000 mL | Freq: Once | OROMUCOSAL | Status: AC
  Administered 2023-08-01: 15 mL via OROMUCOSAL
  Filled 2023-08-01: qty 15

## 2023-08-01 MED ORDER — AMISULPRIDE (ANTIEMETIC) 5 MG/2ML IV SOLN: 10.0000 mg | Freq: Once | INTRAVENOUS | Status: DC | PRN

## 2023-08-01 MED ORDER — FENTANYL CITRATE (PF) 100 MCG/2ML IJ SOLN: 25.0000 ug | INTRAMUSCULAR | Status: DC | PRN

## 2023-08-01 MED ORDER — OXYCODONE HCL 5 MG PO TABS
5.0000 mg | ORAL_TABLET | Freq: Once | ORAL | Status: AC | PRN
  Administered 2023-08-01: 5 mg via ORAL

## 2023-08-01 MED ORDER — LACTATED RINGERS IV SOLN: INTRAVENOUS | Status: DC

## 2023-08-01 MED ORDER — MIDAZOLAM HCL 2 MG/2ML IJ SOLN
INTRAMUSCULAR | Status: AC
  Filled 2023-08-01: qty 2

## 2023-08-01 MED ORDER — SILVER NITRATE-POT NITRATE 75-25 % EX MISC
CUTANEOUS | Status: DC | PRN
  Administered 2023-08-01: 3

## 2023-08-01 MED ORDER — LIDOCAINE HCL 1 % IJ SOLN
INTRAMUSCULAR | Status: DC | PRN
  Administered 2023-08-01: 20 mL

## 2023-08-01 MED ORDER — PROPOFOL 10 MG/ML IV BOLUS
INTRAVENOUS | Status: AC
  Filled 2023-08-01: qty 20

## 2023-08-01 MED ORDER — KETOROLAC TROMETHAMINE 30 MG/ML IJ SOLN
INTRAMUSCULAR | Status: AC
  Filled 2023-08-01: qty 1

## 2023-08-01 MED ORDER — LIDOCAINE 2% (20 MG/ML) 5 ML SYRINGE
INTRAMUSCULAR | Status: DC | PRN
  Administered 2023-08-01: 60 mg via INTRAVENOUS

## 2023-08-01 MED ORDER — MIDAZOLAM HCL 2 MG/2ML IJ SOLN
INTRAMUSCULAR | Status: DC | PRN
  Administered 2023-08-01: 2 mg via INTRAVENOUS

## 2023-08-01 MED ORDER — OXYCODONE HCL 5 MG/5ML PO SOLN: 5.0000 mg | Freq: Once | ORAL | Status: AC | PRN

## 2023-08-01 MED ORDER — LIDOCAINE 2% (20 MG/ML) 5 ML SYRINGE
INTRAMUSCULAR | Status: AC
  Filled 2023-08-01: qty 5

## 2023-08-01 MED ORDER — DOXYCYCLINE HYCLATE 100 MG IV SOLR
200.0000 mg | Freq: Once | INTRAVENOUS | Status: AC
  Administered 2023-08-01: 200 mg via INTRAVENOUS
  Filled 2023-08-01: qty 200

## 2023-08-01 MED ORDER — IBUPROFEN 600 MG PO TABS: 600.0000 mg | ORAL_TABLET | Freq: Four times a day (QID) | ORAL | Status: DC | PRN

## 2023-08-01 MED ORDER — FENTANYL CITRATE (PF) 250 MCG/5ML IJ SOLN
INTRAMUSCULAR | Status: DC | PRN
  Administered 2023-08-01 (×2): 50 ug via INTRAVENOUS

## 2023-08-01 SURGICAL SUPPLY — 19 items
FILTER UTR ASPR ASSEMBLY (MISCELLANEOUS) ×1 IMPLANT
GLOVE BIOGEL PI IND STRL 7.0 (GLOVE) ×1 IMPLANT
GLOVE BIOGEL PI IND STRL 7.5 (GLOVE) ×1 IMPLANT
GLOVE SS BIOGEL STRL SZ 7 (GLOVE) IMPLANT
GLOVE SURG SS PI 6.5 STRL IVOR (GLOVE) IMPLANT
GOWN STRL REUS W/ TWL LRG LVL3 (GOWN DISPOSABLE) ×1 IMPLANT
GOWN STRL REUS W/ TWL XL LVL3 (GOWN DISPOSABLE) ×1 IMPLANT
GOWN STRL REUS W/TWL LRG LVL3 (GOWN DISPOSABLE) ×1
GOWN STRL REUS W/TWL XL LVL3 (GOWN DISPOSABLE) ×1
HOSE CONNECTING 18IN BERKELEY (TUBING) ×1 IMPLANT
KIT BERKELEY 1ST TRI 3/8 NO TR (MISCELLANEOUS) ×1 IMPLANT
KIT BERKELEY 1ST TRIMESTER 3/8 (MISCELLANEOUS) ×1 IMPLANT
NS IRRIG 1000ML POUR BTL (IV SOLUTION) ×1 IMPLANT
PACK VAGINAL MINOR WOMEN LF (CUSTOM PROCEDURE TRAY) ×1 IMPLANT
PAD OB MATERNITY 4.3X12.25 (PERSONAL CARE ITEMS) ×1 IMPLANT
SET BERKELEY SUCTION TUBING (SUCTIONS) ×1 IMPLANT
SPIKE FLUID TRANSFER (MISCELLANEOUS) ×1 IMPLANT
TOWEL GREEN STERILE FF (TOWEL DISPOSABLE) ×2 IMPLANT
VACURETTE 8 RIGID CVD (CANNULA) IMPLANT

## 2023-08-01 NOTE — Anesthesia Preprocedure Evaluation (Signed)
Anesthesia Evaluation  Patient identified by MRN, date of birth, ID band Patient awake    Reviewed: Allergy & Precautions, NPO status , Patient's Chart, lab work & pertinent test results  Airway Mallampati: II  TM Distance: >3 FB Neck ROM: Full    Dental  (+) Dental Advisory Given   Pulmonary asthma , former smoker   breath sounds clear to auscultation       Cardiovascular negative cardio ROS  Rhythm:Regular Rate:Normal     Neuro/Psych negative neurological ROS     GI/Hepatic negative GI ROS, Neg liver ROS,,,  Endo/Other  negative endocrine ROS    Renal/GU negative Renal ROS     Musculoskeletal   Abdominal   Peds  Hematology  (+) Blood dyscrasia, anemia   Anesthesia Other Findings   Reproductive/Obstetrics Molar pregnancy                              Anesthesia Physical Anesthesia Plan  ASA: 2  Anesthesia Plan: General   Post-op Pain Management: Tylenol PO (pre-op)* and Toradol IV (intra-op)*   Induction: Intravenous  PONV Risk Score and Plan: 3 and Dexamethasone, Ondansetron, Midazolam and Treatment may vary due to age or medical condition  Airway Management Planned: Oral ETT and LMA  Additional Equipment:   Intra-op Plan:   Post-operative Plan: Extubation in OR  Informed Consent: I have reviewed the patients History and Physical, chart, labs and discussed the procedure including the risks, benefits and alternatives for the proposed anesthesia with the patient or authorized representative who has indicated his/her understanding and acceptance.     Dental advisory given  Plan Discussed with: CRNA  Anesthesia Plan Comments:        Anesthesia Quick Evaluation

## 2023-08-01 NOTE — Anesthesia Procedure Notes (Signed)
Procedure Name: LMA Insertion Date/Time: 08/01/2023 10:51 AM  Performed by: Shary Decamp, CRNAPre-anesthesia Checklist: Patient identified, Emergency Drugs available, Suction available and Patient being monitored Patient Re-evaluated:Patient Re-evaluated prior to induction Oxygen Delivery Method: Circle system utilized Preoxygenation: Pre-oxygenation with 100% oxygen Induction Type: IV induction Ventilation: Mask ventilation without difficulty LMA: LMA flexible inserted LMA Size: 4.0 Number of attempts: 1 Placement Confirmation: positive ETCO2 and breath sounds checked- equal and bilateral Tube secured with: Tape Dental Injury: Teeth and Oropharynx as per pre-operative assessment

## 2023-08-01 NOTE — Op Note (Signed)
Operative Note   08/01/2023  PRE-OP DIAGNOSIS: Retained products of conception. Non viable pregnancy. Concern for molar pregnancy on ultrasound   POST-OP DIAGNOSIS: Same  SURGEON: Surgeons and Role:    [place] Bing, MD - Primary  ASSISTANT: None  PROCEDURE:  Suction dilation and curettage  ANESTHESIA: General and paracervical block  ESTIMATED BLOOD LOSS:  DRAINS: I/O cath per OR note  TOTAL IV FLUIDS: per OR note  SPECIMENS: products of conception to pathology  VTE PROPHYLAXIS: SCDs to the bilateral lower extremities  ANTIBIOTICS: Doxycycline 200mg  IV x 1 pre op  COMPLICATIONS: none  DISPOSITION: PACU - hemodynamically stable.  CONDITION: stable  BLOOD TYPE:  O Positive. Rhogam given:not applicable  FINDINGS: Exam under anesthesia revealed 10 week sized uterus with no masses and bilateral adnexa without masses or fullness. Large amount of necrotic appearing products of conception were seen, with gritty texture in all four quadrants. Clean endometrial stripe at the end of the procedure  PROCEDURE IN DETAIL:  After informed consent was obtained, the patient was taken to the operating room where anesthesia was obtained without difficulty. The patient was positioned in the dorsal lithotomy position in [place] stirrups. The patient was examined under anesthesia, with the above noted findings.  The bi-valved speculum was placed inside the patient's vagina, and the the anterior lip of the cervix was seen and grasped with the tenaculum.  A paracervical block was achieved with 20mL of 1% lidocaine and then the cervix was progressively dilated to a 31 French-Pratt dilator.  The suction was then calibrated to and connected to the number 10 cannula, which was then introduced with the above noted findings. A gentle curettage was done at the end and yield no products of conception.   The suction was then done one more time to remove any remaining curettage material. Post  procedure bedside ultrasound negative  Excellent hemostasis was noted, and all instruments were removed, with excellent hemostasis noted throughout.  She was then taken out of dorsal lithotomy. The patient tolerated the procedure well.  Sponge, lap and instrument counts were correct x2.  The patient was taken to recovery room in excellent condition.  Cornelia Copa MD Attending [facility] Midwife)

## 2023-08-01 NOTE — Transfer of Care (Signed)
Immediate Anesthesia Transfer of Care Note  Patient: Eileen Graham  Procedure(s) Performed: DILATATION AND EVACUATION (Vagina )  Patient Location: PACU  Anesthesia Type:General  Level of Consciousness: awake, alert , oriented, and patient cooperative  Airway & Oxygen Therapy: Patient Spontanous Breathing and Patient connected to face mask oxygen  Post-op Assessment: Report given to RN, Post -op Vital signs reviewed and stable, and Patient moving all extremities X 4  Post vital signs: Reviewed and stable  Last Vitals:  Vitals Value Taken Time  BP 120/77 08/01/23 1135  Temp    Pulse 91 08/01/23 1137  Resp 15 08/01/23 1137  SpO2 97 % 08/01/23 1137  Vitals shown include unfiled device data.  Last Pain:  Vitals:   08/01/23 1026  TempSrc:   PainSc: 2       Patients Stated Pain Goal: 2 (08/01/23 1026)  Complications: No notable events documented.

## 2023-08-01 NOTE — Discharge Instructions (Addendum)
   We will discuss your surgery once again in detail at your post-op visit in two to four weeks. If you haven't already done so, please call to make your appointment as soon as possible.  Dilation and Curettage or Vacuum Curettage, Care After These instructions give you information on caring for yourself after your procedure. Your doctor may also give you more specific instructions. Call your doctor if you have any problems or questions after your procedure. HOME CARE Do not drive for 24 hours. Wait 1 week before doing any activities that wear you out. Do not stand for a long time. Limit stair climbing to once or twice a day. Rest often. Continue with your usual diet. Drink enough fluids to keep your pee (urine) clear or pale yellow. If you have a hard time pooping (constipation), you may: Take a medicine to help you go poop (laxative) as told by your doctor. Eat more fruit and bran. Drink more fluids. Take showers, not baths, for as long as told by your doctor. Do not swim or use a hot tub until your doctor says it is okay. Have someone with you for 1day after the procedure. Do not douche, use tampons, or have sex (intercourse) until seen by your doctor Only take medicines as told by your doctor. Do not take aspirin. It can cause bleeding. Keep all doctor visits. GET HELP IF: You have cramps or pain not helped by medicine. You have new pain in the belly (abdomen). You have a bad smelling fluid coming from your vagina. You have a rash. You have problems with any medicine. GET HELP RIGHT AWAY IF:  You start to bleed more than a regular period. You have a fever. You have chest pain. You have trouble breathing. You feel dizzy or feel like passing out (fainting). You pass out. You have pain in the tops of your shoulders. You have vaginal bleeding with or without clumps of blood (blood clots). MAKE SURE YOU: Understand these instructions. Will watch your condition. Will get help  right away if you are not doing well or get worse. Document Released: 07/30/2008 Document Revised: 10/26/2013 Document Reviewed: 05/20/2013 ExitCare Patient Information 2015 [employer]. This information is not intended to replace advice given to you by your health care provider. Make sure you discuss any questions you have with your health care provider.   

## 2023-08-01 NOTE — H&P (Signed)
Obstetrics & Gynecology Surgical H&P   Date of Surgery: 08/01/2023    Primary OBGYN: [facility]  Reason for Admission: retained products of conception; concern for molar pregnancy  History of Present Illness: Ms. Morrish is a 27 y.o. [id number] (No LMP recorded. (Menstrual status: Oral contraceptives).), with the above CC. PMHx is significant for recent surgical elective AB in late June  Patient seen with MAU and diagnosed with RPOCs and set up for surgery today; pt only endorses scant spotting and no fevers, chills since seen yesterday in the MAU  ROS: A 12-point review of systems was performed and negative, except as stated in the above HPI.  OBGYN History: As per HPI. OB History  Gravida Para Term Preterm AB Living  5 2 2  0 3 2  SAB IAB Ectopic Multiple Live Births  0 3 0 0 2    # Outcome Date GA Lbr Len/2nd Weight Sex Type Anes PTL Lv  5 IAB 2024          4 Term 09/19/21 [redacted]w[redacted]d / 00:16 2990 g F Vag-Spont EPI  LIV     Birth Comments: wnl  3 Term 03/17/13    M Vag-Spont   LIV  2 IAB           1 IAB              Past Medical History: Past Medical History:  Diagnosis Date   Alopecia    Asthma    Headache    Hyperemesis gravidarum 08/10/2019   Infection    UTI   Shingles    Supervision of high-risk pregnancy 03/06/2021              Nursing Staff    Provider      Office Location     [facility]    Dating     LMP      Language     English    Anatomy US     WNL with EIF       Flu Vaccine     NA    Genetic/Carrier Screen     NIPS: low risk   AFP:     Horizon: neg 4/4      TDaP Vaccine      08/07/21    Hgb A1C or   GTT    Early   Third trimester neg      COVID Vaccine    No         LAB RESULTS       Rhogam     NA    Blood T   Trichomonas infection     Past Surgical History: Past Surgical History:  Procedure Laterality Date   abortion     x2   NO PAST SURGERIES      Family History:  Family History  Problem Relation Age of Onset    Hypertension Mother    Healthy Father    Asthma Maternal Grandfather    Congestive Heart Failure Maternal Grandfather     Social History:  Social History   Socioeconomic History   Marital status: Single    Spouse name: Not on file   Number of children: Not on file   Years of education: Not on file   Highest education level: Not on file  Occupational History   Not on file  Tobacco Use   Smoking status: Former    Types: Cigarettes   Smokeless tobacco: Never   Tobacco  comments:    "years ago"  Vaping Use   Vaping status: Former   Devices: stopped with +UPT  Substance and Sexual Activity   Alcohol use: Yes    Comment: occ   Drug use: Not Currently   Sexual activity: Yes    Birth control/protection: None  Other Topics Concern   Not on file  Social History Narrative   ** Merged History Encounter **       Social Determinants of Health   Financial Resource Strain: Not on file  Food Insecurity: No Food Insecurity (07/28/2023)   Hunger Vital Sign    Worried About Running Out of Food in the Last Year: Never true    Ran Out of Food in the Last Year: Never true  Transportation Needs: No Transportation Needs (07/28/2023)   PRAPARE - Administrator, Civil Service (Medical): No    Lack of Transportation (Non-Medical): No  Physical Activity: Not on file  Stress: Not on file  Social Connections: Not on file  Intimate Partner Violence: Not on file    Allergy: Allergies  Allergen Reactions   Latex Itching    Current Outpatient Medications: Medications Prior to Admission  Medication Sig Dispense Refill Last Dose   albuterol (PROVENTIL) (2.5 MG/3ML) 0.083% nebulizer solution Take 3 mLs (2.5 mg total) by nebulization every 6 (six) hours as needed for wheezing or shortness of breath. 75 mL 12 Past Week   albuterol (VENTOLIN HFA) 108 (90 Base) MCG/ACT inhaler Inhale 1-2 puffs into the lungs every 6 (six) hours as needed for wheezing or shortness of breath. 18 g 0 More  than a month   hydrOXYzine (ATARAX) 25 MG tablet Take 0.5 - 1 tablet at bedtime as needed for insomnia. 30 tablet 0 More than a month   metroNIDAZOLE (FLAGYL) 500 MG tablet Take 1 tablet (500 mg total) by mouth 2 (two) times daily for 7 days. 14 tablet 0    montelukast (SINGULAIR) 10 MG tablet Take 10 mg by mouth at bedtime. (Patient not taking: Reported on 07/28/2023)        Hospital Medications: Current Facility-Administered Medications  Medication Dose Route Frequency Provider Last Rate Last Admin   acetaminophen (TYLENOL) tablet 1,000 mg  1,000 mg Oral Once Marcene Duos, MD       chlorhexidine (PERIDEX) 0.12 % solution 15 mL  15 mL Mouth/Throat Once Collene Schlichter, MD       Or   Oral care mouth rinse  15 mL Mouth Rinse Once Collene Schlichter, MD       doxycycline (VIBRAMYCIN) 200 mg in dextrose 5 % 250 mL IVPB  200 mg Intravenous Once Paoli Bing, MD       lactated ringers infusion   Intravenous Continuous Custar Bing, MD       lactated ringers infusion   Intravenous Continuous Collene Schlichter, MD         Physical Exam:  Current Vital Signs 24h Vital Sign Ranges  T 98.9 F (37.2 C) Temp  Avg: 98.6 F (37 C)  Min: 98.2 F (36.8 C)  Max: 98.9 F (37.2 C)  BP (!) 142/87 BP  Min: 135/79  Max: 142/87  HR 84 Pulse  Avg: 88  Min: 84  Max: 92  RR 18 Resp  Avg: 18  Min: 18  Max: 18  SaO2 98 % Room Air SpO2  Avg: 98 %  Min: 98 %  Max: 98 %       24 Hour I/O  Current Shift I/O  Time Ins Outs No intake/output data recorded. No intake/output data recorded.    Body mass index is 27.81 kg/m. General appearance: Well nourished, well developed female in no acute distress.  Cardiovascular: S1, S2 normal, no murmur, rub or gallop, regular rate and rhythm Respiratory:  Clear to auscultation bilateral. Normal respiratory effort Abdomen: positive bowel sounds and no masses, hernias; diffusely non tender to palpation, non distended Neuro/Psych:  Normal mood and affect.  Skin:   Warm and dry.  Extremities: no clubbing, cyanosis, or edema.   Laboratory: Quant 9/26: 19 O POS  Recent Labs  Lab 07/31/23 1742  WBC 7.2  HGB 11.0*  HCT 33.0*  PLT 205   Recent Labs  Lab 07/31/23 1742  NA 136  K 3.9  CL 104  CO2 20*  BUN 12  CREATININE 0.62  CALCIUM 8.7*  PROT 6.7  BILITOT 0.3  ALKPHOS 32*  ALT 11  AST 14*  GLUCOSE 96    Imaging:  Narrative & Impression  CLINICAL DATA:  Vaginal bleeding.   EXAM: OBSTETRIC <14 WK [name] AND TRANSVAGINAL OB US   TECHNIQUE: Both transabdominal and transvaginal ultrasound examinations were performed for complete evaluation of the gestation as well as the maternal uterus, adnexal regions, and pelvic cul-de-sac. Transvaginal technique was performed to assess early pregnancy.   COMPARISON:  None Available.   FINDINGS: Intrauterine gestational sac: None   Yolk sac:  Not Visualized.   Embryo:  Not Visualized.   Cardiac Activity: Not Visualized.   Heart Rate: N/A  bpm   Maternal uterus/adnexae: The endometrium measures 33.6 mm in thickness and is heterogeneous and hypervascular on color Doppler evaluation.   The bilateral ovaries are visualized and are normal in appearance.   No pelvic free fluid is seen.   IMPRESSION: Mildly thickened, heterogeneous, hypervascular endometrium consistent with retained products of conception.     Electronically Signed   By: Aram Candela M.D.   On: 07/31/2023 21:11      Assessment: Ms. Genrich is a 27 y.o. [id number] here for scheduled surgery for retained products of conception; pt stable  Plan: D/w her re: surgery and pt amenable to proceeding with suction d&c. Will re-draw quant today in case it does come back as molar as final pathology and draw updated t&s; no need to wait on labs before proceeding with surgery  Plan for d/c from pacu.    Cornelia Copa MD Attending Center for [facility])

## 2023-08-01 NOTE — Anesthesia Postprocedure Evaluation (Signed)
Anesthesia Post Note  Patient: Eileen Graham Cancer  Procedure(s) Performed: DILATATION AND EVACUATION (Vagina )     Patient location during evaluation: PACU Anesthesia Type: General Level of consciousness: awake and alert Pain management: pain level controlled Vital Signs Assessment: post-procedure vital signs reviewed and stable Respiratory status: spontaneous breathing, nonlabored ventilation, respiratory function stable and patient connected to nasal cannula oxygen Cardiovascular status: blood pressure returned to baseline and stable Postop Assessment: no apparent nausea or vomiting Anesthetic complications: no  No notable events documented.  Last Vitals:  Vitals:   08/01/23 1200 08/01/23 1215  BP: 122/72 123/70  Pulse: 75 73  Resp: 14 13  Temp:    SpO2: 99% 99%    Last Pain:  Vitals:   08/01/23 1145  TempSrc:   PainSc: 2    Pain Goal: Patients Stated Pain Goal: 2 (08/01/23 1026)                 Farrin Shadle L Jadier Rockers

## 2023-08-02 ENCOUNTER — Encounter (HOSPITAL_COMMUNITY): Payer: Self-pay | Admitting: Obstetrics and Gynecology

## 2023-08-04 ENCOUNTER — Encounter: Payer: Self-pay | Admitting: Obstetrics and Gynecology

## 2023-08-04 LAB — SURGICAL PATHOLOGY

## 2023-08-07 ENCOUNTER — Telehealth: Payer: Self-pay | Admitting: Obstetrics and Gynecology

## 2023-08-07 ENCOUNTER — Other Ambulatory Visit (HOSPITAL_COMMUNITY): Payer: Medicaid Other | Attending: Obstetrics and Gynecology

## 2023-08-07 DIAGNOSIS — O020 Blighted ovum and nonhydatidiform mole: Secondary | ICD-10-CM | POA: Insufficient documentation

## 2023-08-07 NOTE — Telephone Encounter (Signed)
GYN Telephone note Patient called and d&c pathology results d/w her and that it is consistent with a molar pregnancy. Last hcg six days ago was 20. I told her that generally all you need is surgery but we do have to monitor her levels down to negative x 3 weeks and then monthtly x 3 months. Pt has to take off work on the weekdays so will come to MAU this weekend, tentatively for Saturday, for repeat hcg.  All questions asked and answered  Cornelia Copa MD Attending Center for [employer] (Faculty Practice) 08/07/2023 Time: [phone]
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
## 2023-08-11 NOTE — Progress Notes (Signed)
Error

## 2023-08-15 ENCOUNTER — Telehealth: Payer: Self-pay

## 2023-08-15 NOTE — Telephone Encounter (Signed)
-----   Message from Koppel sent at 08/13/2023  9:16 PM EDT ----- Please call her and set her up for a non stat beta hcg.

## 2023-08-15 NOTE — Telephone Encounter (Signed)
Patient called back.  Patient stated she will go this weekend to MAU to have her labs done.  Pt. Stated she needs to reschedule her appointment for post op on 10/25, rescheduled for 11/05 due to work schedule.   Felecia Shelling  CMA

## 2023-08-17 ENCOUNTER — Other Ambulatory Visit: Payer: Self-pay

## 2023-08-17 ENCOUNTER — Inpatient Hospital Stay (HOSPITAL_COMMUNITY)
Admission: AD | Admit: 2023-08-17 | Discharge: 2023-08-17 | Disposition: A | Discharge status: Home / Self Care | Payer: Medicaid Other | Attending: Obstetrics and Gynecology | Admitting: Obstetrics and Gynecology

## 2023-08-17 DIAGNOSIS — Z3A00 Weeks of gestation of pregnancy not specified: Secondary | ICD-10-CM

## 2023-08-17 DIAGNOSIS — O020 Blighted ovum and nonhydatidiform mole: Secondary | ICD-10-CM | POA: Diagnosis present

## 2023-08-17 LAB — HCG, QUANTITATIVE, PREGNANCY: hCG, Beta Chain, Quant, S: <1 m[IU]/mL (ref <5)

## 2023-08-17 NOTE — MAU Note (Signed)
.  Eileen Graham is a 27 y.o. at Unknown here in MAU for follow up hcg.  Pt reports bleeding is still light to moderate.  Denies pain today.   Pain score: 0 Vitals:   08/17/23 1121  BP: 127/75  Pulse: 89  Resp: 15  Temp: 99 F (37.2 C)      Lab orders placed from triage:   hcg

## 2023-08-17 NOTE — MAU Provider Note (Signed)
History     CSN: [id number]  Arrival date and time: 08/17/23 1043   Event Date/Time   First Provider Initiated Contact with Patient 08/17/23 1128      Chief Complaint  Patient presents with   Follow-up   HPI Eileen Graham is a 27 y.o. [id number] who presents today for repeat hCG draw. She was seen on 9/26 in MAU for VB, suspected molar pregnancy vs retained POC. Had D&C on 9/27. Pathology c/w molar pregnancy -- here for repeat hCG today. Reports light to moderate VB, no pain. No f/c, n/v, abd pain, c/d, urinary sxs today.    Past Medical History:  Diagnosis Date   Alopecia    Asthma    Headache    Hyperemesis gravidarum 08/10/2019   Infection    UTI   Postpartum hypertension 11/21/2021   Shingles    Supervision of high-risk pregnancy 03/06/2021              Nursing Staff    Provider      Office Location     [facility]    Dating     LMP      Language     English    Anatomy US     WNL with EIF       Flu Vaccine     NA    Genetic/Carrier Screen     NIPS: low risk   AFP:     Horizon: neg 4/4      TDaP Vaccine      08/07/21    Hgb A1C or   GTT    Early   Third trimester neg      COVID Vaccine    No         LAB RESULTS       Rhogam     NA    Blood T   Trichomonas infection     Past Surgical History:  Procedure Laterality Date   abortion     x2   DILATION AND EVACUATION N/A 08/01/2023   Procedure: DILATATION AND EVACUATION;  Surgeon: Iredell Bing, MD;  Location: [facility] OR;  Service: Gynecology;  Laterality: N/A;   NO PAST SURGERIES      Family History  Problem Relation Age of Onset   Hypertension Mother    Healthy Father    Asthma Maternal Grandfather    Congestive Heart Failure Maternal Grandfather     Social History   Tobacco Use   Smoking status: Former    Types: Cigarettes   Smokeless tobacco: Never   Tobacco comments:    "years ago"  Vaping Use   Vaping status: Every Day   Devices: stopped with +UPT  Substance Use Topics   Alcohol use: Yes    Comment:  occ   Drug use: Not Currently    Allergies:  Allergies  Allergen Reactions   Latex Itching    Medications Prior to Admission  Medication Sig Dispense Refill Last Dose   albuterol (PROVENTIL) (2.5 MG/3ML) 0.083% nebulizer solution Take 3 mLs (2.5 mg total) by nebulization every 6 (six) hours as needed for wheezing or shortness of breath. (Patient taking differently: Take 2.5 mg by nebulization as needed for wheezing or shortness of breath.) 75 mL 12    albuterol (VENTOLIN HFA) 108 (90 Base) MCG/ACT inhaler Inhale 1-2 puffs into the lungs every 6 (six) hours as needed for wheezing or shortness of breath. (Patient taking differently: Inhale 2-3 puffs into the lungs as needed for wheezing  or shortness of breath.) 18 g 0    fluconazole (DIFLUCAN) 150 MG tablet Take 150 mg by mouth daily. (Patient not taking: Reported on 08/01/2023)      Heating Pads PADS 1 Pad by Does not apply route as needed.      ibuprofen (ADVIL) 600 MG tablet Take 1 tablet (600 mg total) by mouth every 6 (six) hours as needed.      Norgestimate-Ethinyl Estradiol Triphasic (TRI-LO-MARZIA) 0.18/0.215/0.25 MG-25 MCG tab Take 1 tablet by mouth daily.      PRESCRIPTION MEDICATION Take 1 tablet by mouth as needed (muscle spasms).       ROS reviewed and pertinent positives and negatives as documented in HPI.  Physical Exam   Blood pressure 127/75, pulse 89, temperature 99 F (37.2 C), temperature source Oral, resp. rate 15, currently breastfeeding.  Physical Exam Constitutional:      General: She is not in acute distress.    Appearance: Normal appearance. She is not ill-appearing.  HENT:     Head: Normocephalic and atraumatic.  Cardiovascular:     Rate and Rhythm: Normal rate.  Pulmonary:     Effort: Pulmonary effort is normal.     Breath sounds: Normal breath sounds.  Abdominal:     Palpations: Abdomen is soft.     Tenderness: There is no abdominal tenderness. There is no guarding.  Musculoskeletal:        General:  Normal range of motion.  Skin:    General: Skin is warm and dry.     Findings: No rash.  Neurological:     General: No focal deficit present.     Mental Status: She is alert and oriented to person, place, and time.     MAU Course  Procedures  MDM 27 y.o. [id number] s/p D&C for VB, found to have molar pregnnacy here for repeat quant. No sxs reported today, appears well on exam. Plan for repeat quant, pt desires to be discharged, will reach out via MyChart or phone call with results.   hCG pattern: 9/26 - 19 9/27 - 20  Assessment and Plan  Molar pregnancy - Plan: Discharge patient Repeat quant today, plan for monitoring until negative for 3 weeks then monthly for 3 months. Will discuss recommendations w pt, message sent to [facility] to assist in coordinating f/up labs.   Sundra Aland, MD OB Fellow, Faculty Practice [facility], Center for [facility]  08/17/2023, 12:31 PM

## 2023-08-25 ENCOUNTER — Ambulatory Visit: Payer: Medicaid Other

## 2023-08-29 ENCOUNTER — Ambulatory Visit: Payer: Medicaid Other | Admitting: Obstetrics and Gynecology

## 2023-09-02 IMAGING — US US MFM FETAL BPP W/O NON-STRESS
1 series · 15 of 16 positions shown · non-contrast
Comparison: none

[Series 1: us mfm fetal bpp w/o non-stress · 16 acquisitions, 15 frames shown]
[im 1/16]
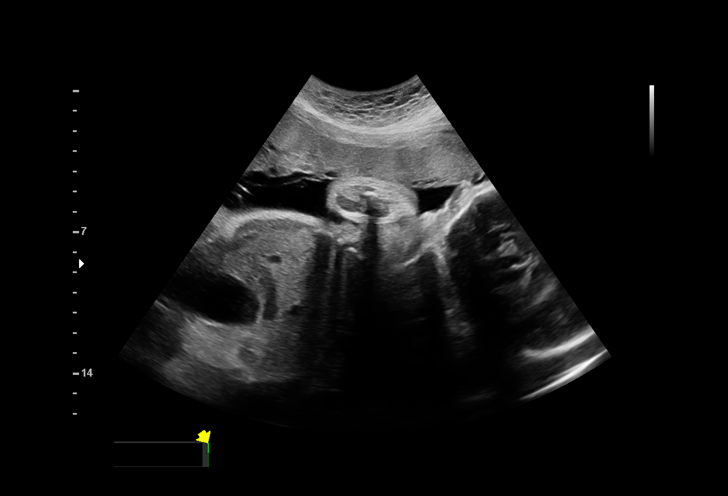
[im 2/16]
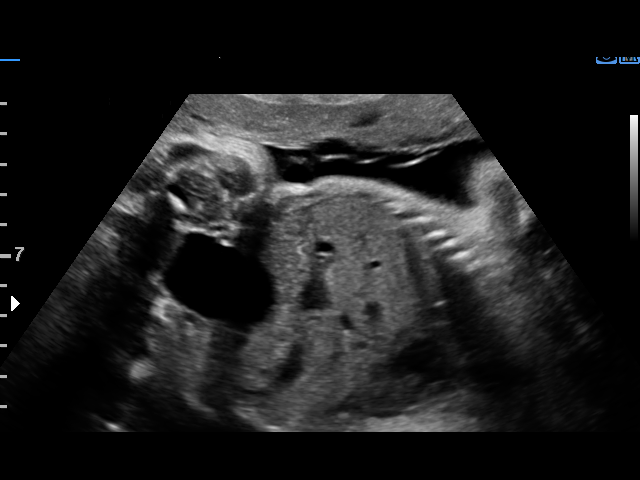
[im 3/16]
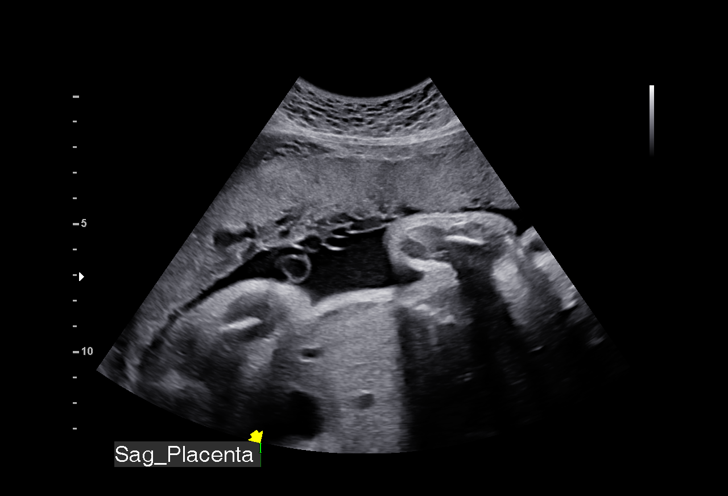
[im 4/16]
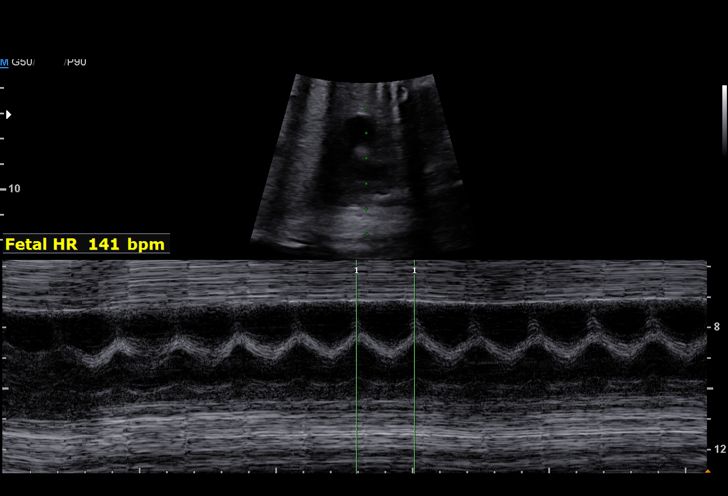
[im 5/16]
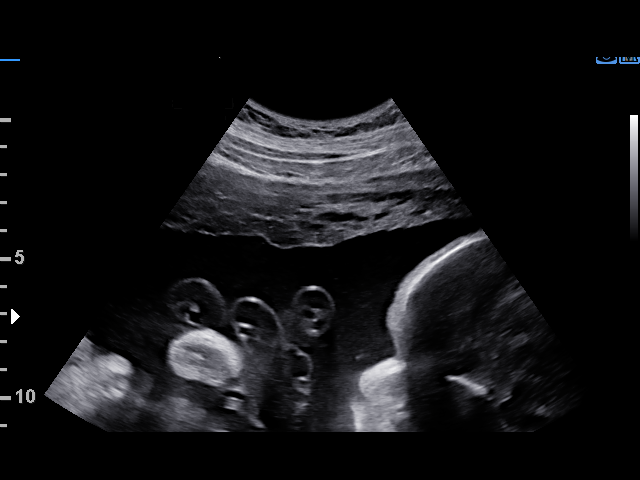
[im 6/16]
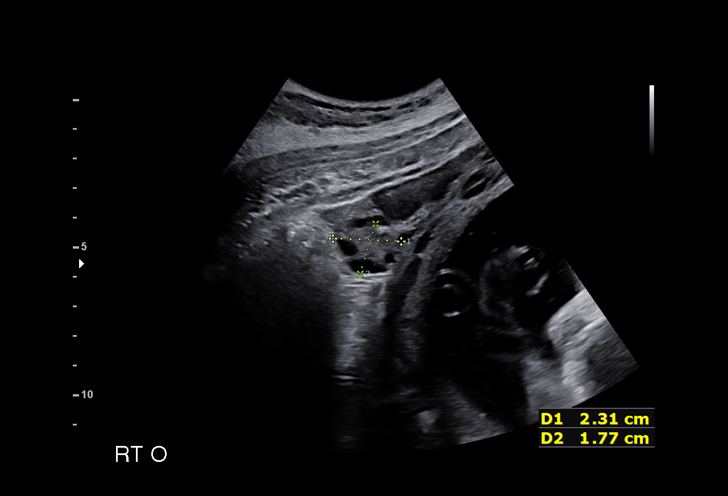
[im 7/16]
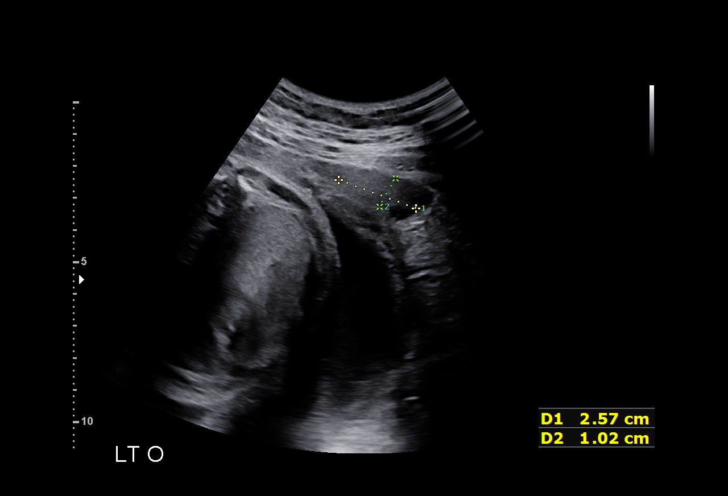
[im 9/16]
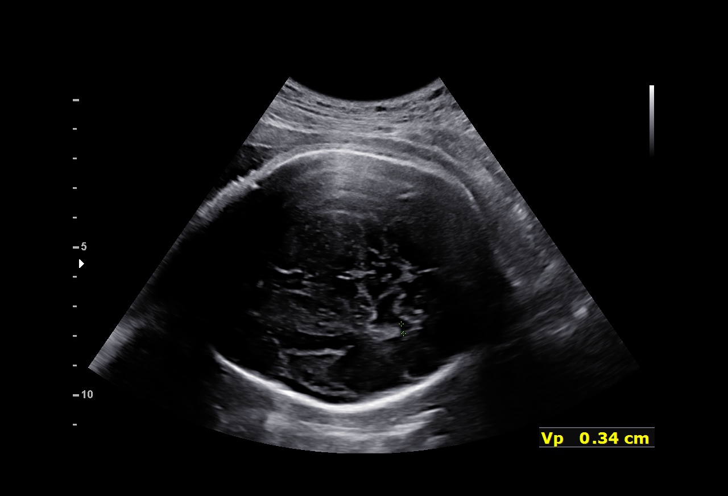
[im 10/16]
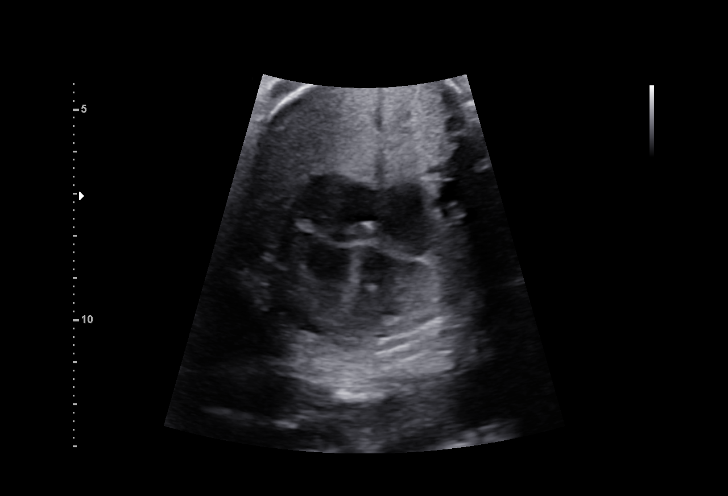
[im 11/16]
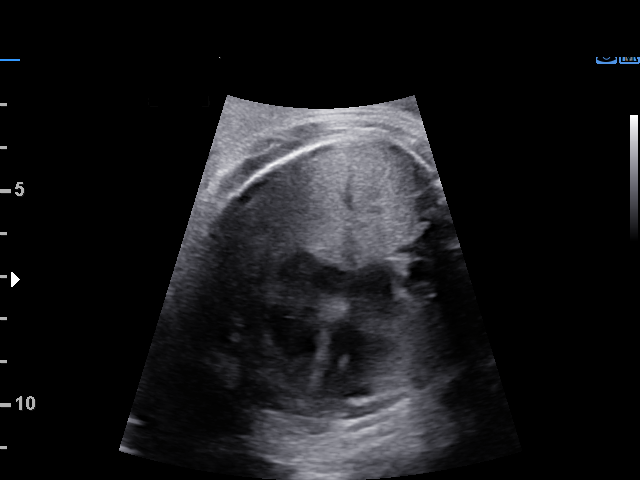
[im 12/16]
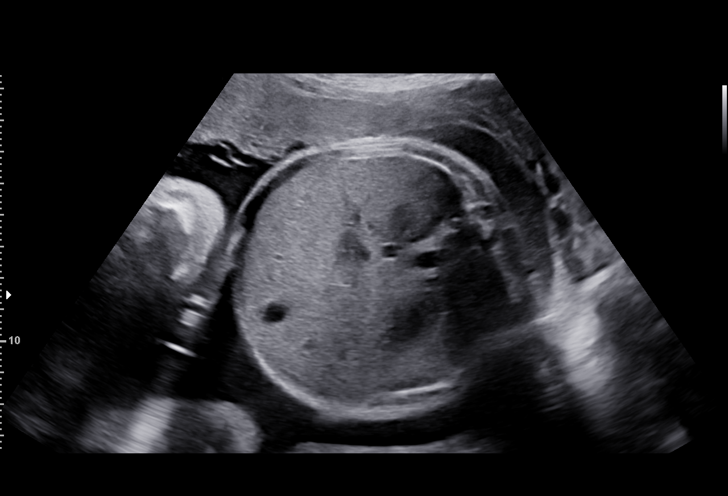
[im 13/16]
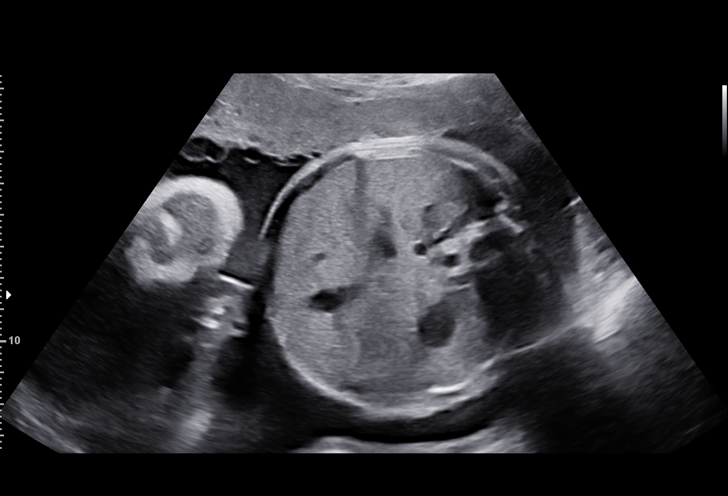
[im 14/16]
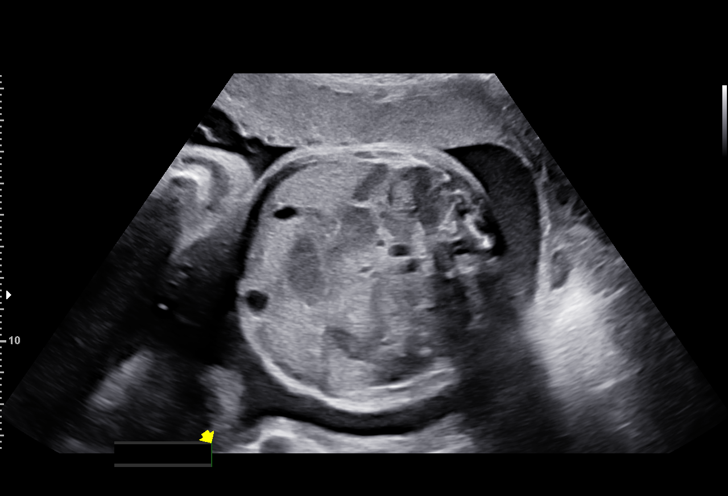
[im 15/16]
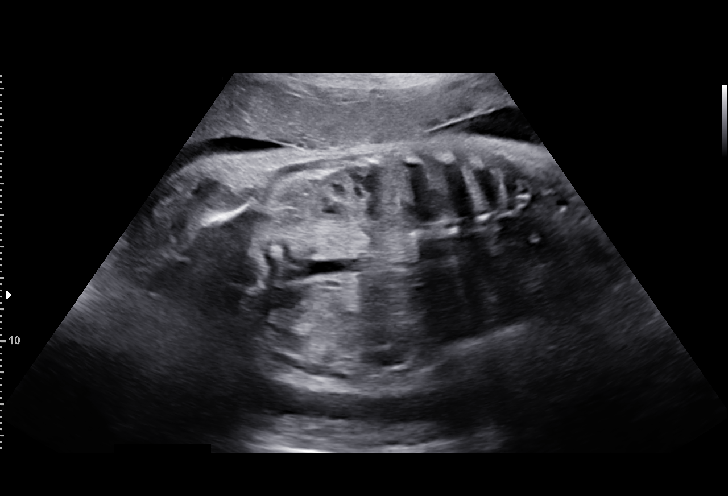
[im 16/16]
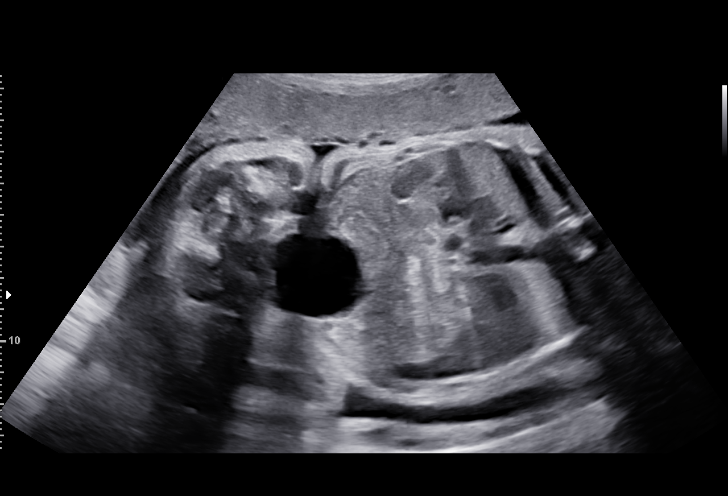

[15 of 16 positions shown; findings below may reference images not displayed]

Indications

 32 weeks gestation of pregnancy
 Echogenic intracardiac focus of the heart
 (EIF)
 Asthma                                          TOO.FO [id number]
 LR NIPS, Neg Horizon, Neg AFP
Fetal Evaluation

 Num Of Fetuses:          1
 Fetal Heart Rate(bpm):   141
 Cardiac Activity:        Observed
 Presentation:            Cephalic
 Placenta:                Anterior
 P. Cord Insertion:       Previously Visualized

 Amniotic Fluid
 AFI FV:      Subjectively increased

 AFI Sum(cm)     %Tile       Largest Pocket(cm)
 22.13           85

 RUQ(cm)       RLQ(cm)       LUQ(cm)        LLQ(cm)

Biometry
 LV:        3.4  mm
OB History

 Gravidity:    4         Term:   1
 TOP:          2         Living: 1
Gestational Age

 LMP:           32w 5d        Date:  12/28/20                 EDD:   10/04/21
 Best:          32w 5d     Det. By:  LMP  (12/28/20)          EDD:   10/04/21
Anatomy

 Ventricles:            Appears normal         Kidneys:                Appear normal
 Heart:                 Appears normal         Bladder:                Appears normal
                        (4CH, axis, and
                        situs)
 Stomach:               Appears normal, left
                        sided
Cervix Uterus Adnexa

 Cervix
 Not visualized (advanced GA >23wks)

 Right Ovary
 Visualized.

 Left Ovary
 Visualized.
Impression

 Antenatal testing
 The biophysical profile was [DATE] with good fetal movement and
 amniotic fluid volume.
Recommendations

 Follow up as clinically indicated.

## 2023-09-09 ENCOUNTER — Ambulatory Visit (INDEPENDENT_AMBULATORY_CARE_PROVIDER_SITE_OTHER): Payer: Medicaid Other | Admitting: Obstetrics and Gynecology

## 2023-09-09 ENCOUNTER — Other Ambulatory Visit: Payer: Self-pay

## 2023-09-09 VITALS — BP 150/95 | HR 89 | Wt 161.0 lb

## 2023-09-09 DIAGNOSIS — J302 Other seasonal allergic rhinitis: Secondary | ICD-10-CM

## 2023-09-09 DIAGNOSIS — O020 Blighted ovum and nonhydatidiform mole: Secondary | ICD-10-CM

## 2023-09-09 DIAGNOSIS — Z3009 Encounter for other general counseling and advice on contraception: Secondary | ICD-10-CM

## 2023-09-09 DIAGNOSIS — J45909 Unspecified asthma, uncomplicated: Secondary | ICD-10-CM

## 2023-09-09 DIAGNOSIS — R030 Elevated blood-pressure reading, without diagnosis of hypertension: Secondary | ICD-10-CM | POA: Insufficient documentation

## 2023-09-09 DIAGNOSIS — Z3A00 Weeks of gestation of pregnancy not specified: Secondary | ICD-10-CM

## 2023-09-09 DIAGNOSIS — Z0000 Encounter for general adult medical examination without abnormal findings: Secondary | ICD-10-CM

## 2023-09-09 DIAGNOSIS — Z1330 Encounter for screening examination for mental health and behavioral disorders, unspecified: Secondary | ICD-10-CM | POA: Diagnosis not present

## 2023-09-09 MED ORDER — VENTOLIN HFA 108 (90 BASE) MCG/ACT IN AERS: 1.0000 | INHALATION_SPRAY | Freq: Four times a day (QID) | RESPIRATORY_TRACT | 1 refills | Status: DC | PRN

## 2023-09-09 MED ORDER — SLYND 4 MG PO TABS: 1.0000 | ORAL_TABLET | Freq: Every day | ORAL | 3 refills | Status: DC

## 2023-09-09 MED ORDER — MONTELUKAST SODIUM 10 MG PO TABS: 10.0000 mg | ORAL_TABLET | Freq: Every day | ORAL | 3 refills | Status: AC | PRN

## 2023-09-09 NOTE — Progress Notes (Signed)
Obstetrics and Gynecology Visit Return Patient Evaluation  Appointment Date: 09/09/2023  Primary Care Provider: Default, Provider  OBGYN Clinic: [facility]   Chief Complaint: regular post op visit, molar pregnancy follow up.   History of Present Illness:  Eileen Graham is a 27 y.o. doing well and no problems or issues. Pt on Tri lo mariza and had a period starting yesterday.   Patient and an abortion in June and no period since then. Eileen Graham, regular periods prior to that.   08/17/23: <1 07/31/23: 19 08/01/23: d&c>molar pregnancy; hcg 20  Review of Systems: as noted in the History of Present Illness.  Medications: tri lo marzia ( EE)   Allergies: is allergic to latex.  Physical Exam:  BP (!) 153/90   Pulse 97   Wt 161 lb (73 kg)   LMP 09/08/2023 Comment: light bleeding  BMI 28.52 kg/m  Body mass index is 28.52 kg/m. Repeat BP 150/95 General appearance: Well nourished, well developed female in no acute distress.  Neuro/Psych:  Normal mood and affect.    Assessment: patient doing well  Plan:  1. Molar pregnancy D/w her re: qwk beta until neg x 3 and qmonth until neg x 37m which she is amenable to - Beta hCG quant (ref lab)  2. Contraception Will switch to [facility] and f/u BP at lab visits. PCP referral made  3. Seasonal allergies Singulair and albuterol request and refilled today  4. Transient hypertension  Return in about 1 week (around 09/16/2023) for 7-10d lab only non stat hcg and BP check .  No future appointments.  Cornelia Copa MD Attending [facility] [employer])

## 2023-09-10 LAB — BETA HCG QUANT (REF LAB): hCG Quant: <1 m[IU]/mL

## 2023-09-15 ENCOUNTER — Other Ambulatory Visit: Payer: Self-pay

## 2023-09-15 DIAGNOSIS — O020 Blighted ovum and nonhydatidiform mole: Secondary | ICD-10-CM

## 2023-09-15 NOTE — Telephone Encounter (Signed)
error 

## 2023-09-17 ENCOUNTER — Other Ambulatory Visit: Payer: Self-pay

## 2023-09-17 ENCOUNTER — Other Ambulatory Visit: Payer: Medicaid Other

## 2023-09-17 ENCOUNTER — Ambulatory Visit (INDEPENDENT_AMBULATORY_CARE_PROVIDER_SITE_OTHER): Payer: Medicaid Other

## 2023-09-17 VITALS — BP 155/94 | HR 92 | Ht 63.0 in | Wt 161.9 lb

## 2023-09-17 DIAGNOSIS — Z3A00 Weeks of gestation of pregnancy not specified: Secondary | ICD-10-CM

## 2023-09-17 DIAGNOSIS — O020 Blighted ovum and nonhydatidiform mole: Secondary | ICD-10-CM

## 2023-09-17 DIAGNOSIS — R030 Elevated blood-pressure reading, without diagnosis of hypertension: Secondary | ICD-10-CM

## 2023-09-17 NOTE — Progress Notes (Signed)
Pt here today for BP check due to starting new BC pills Rx Slynd x 1 week ago. Pt states doing well on them. No symptoms with taking new med. States had migraine yesterday. Pt states having only elevated BP's when taking BC pills. Pt denies any nausea, headaches or visual changes today.  Pt is unsure what she wants to do for birth control at this point. Has tried Nexplanon, IUD and Depo. Recent Hx of Molar pregnancy and is here today for next non-stat beta as well.   Will inform Dr Vergie Living of results today. Dr Vergie Living instructed patient to continue to take Rx Slynd and will follow up with primary care. Helped make primary care appt for Nov 27th at 840am at [facility] Pt agreeable to date and time of appt.   Perris Tripathi, RNC   174/103- 1st BP 155/94- 2nd BP

## 2023-09-18 LAB — BETA HCG QUANT (REF LAB): hCG Quant: <1 m[IU]/mL

## 2023-09-22 ENCOUNTER — Other Ambulatory Visit: Payer: Self-pay | Admitting: Obstetrics and Gynecology

## 2023-09-22 DIAGNOSIS — O020 Blighted ovum and nonhydatidiform mole: Secondary | ICD-10-CM

## 2023-09-23 ENCOUNTER — Telehealth: Payer: Self-pay

## 2023-09-23 IMAGING — US US MFM FETAL BPP W/ NON-STRESS
1 series · 13 of 28 positions shown · non-contrast
Comparison: none

[Series 1: us mfm fetal bpp w/ non-stress · 82 acquisitions, 13 frames shown]
[im 4/82]
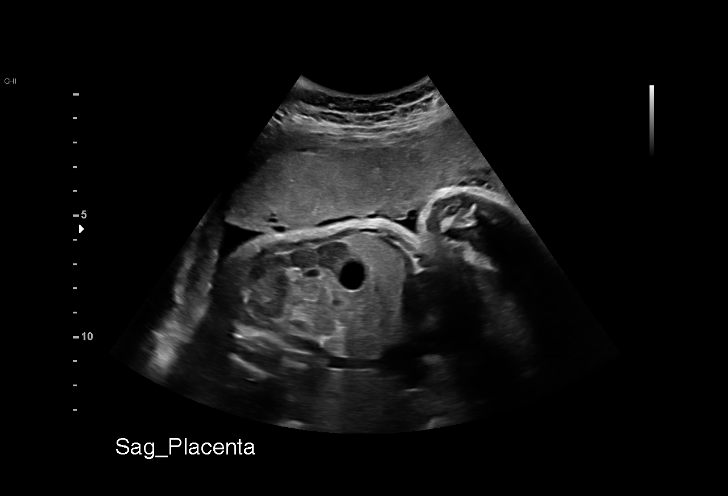
[im 10/82]
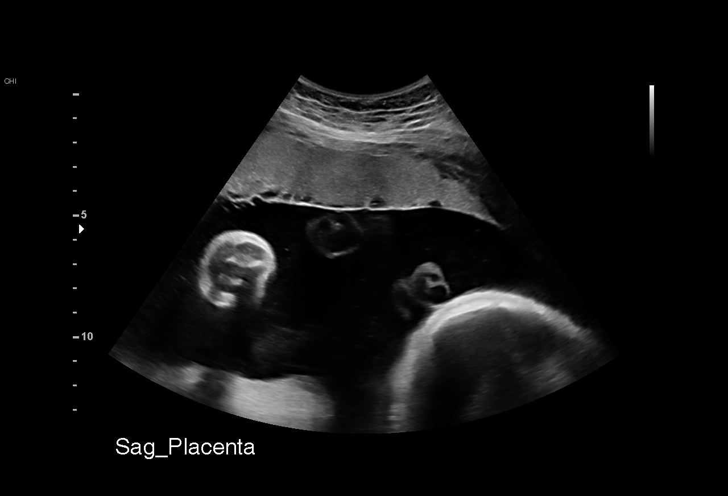
[im 16/82]
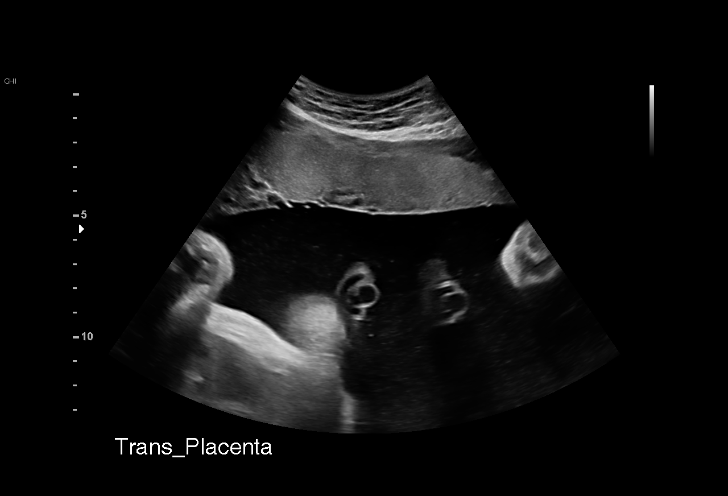
[im 22/82]
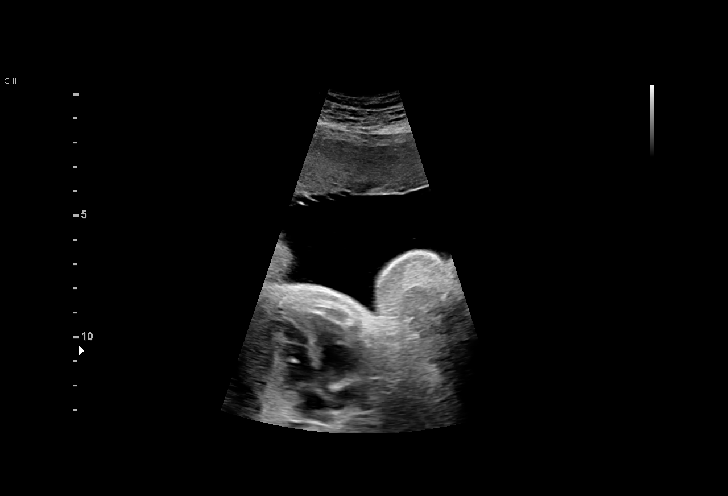
[im 28/82]
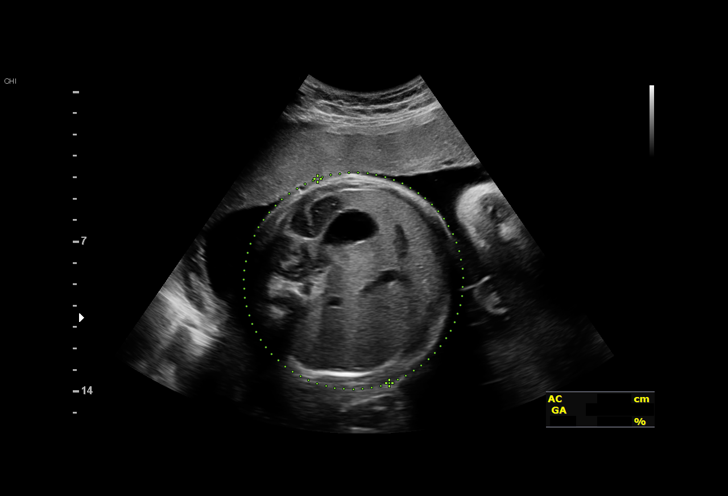
[im 34/82]
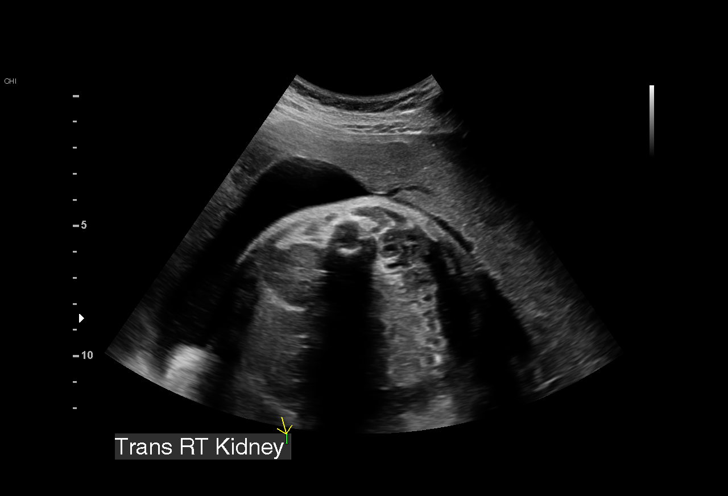
[im 43/82]
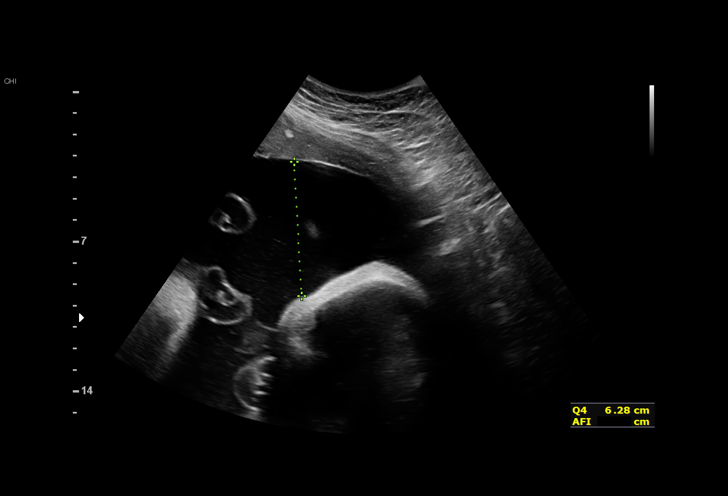
[im 49/82]
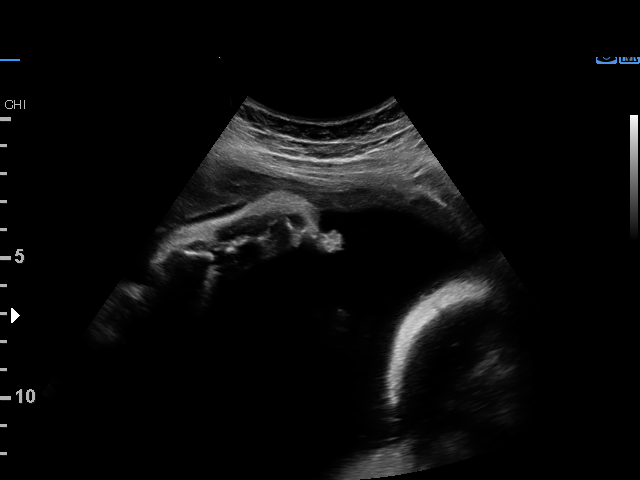
[im 55/82]
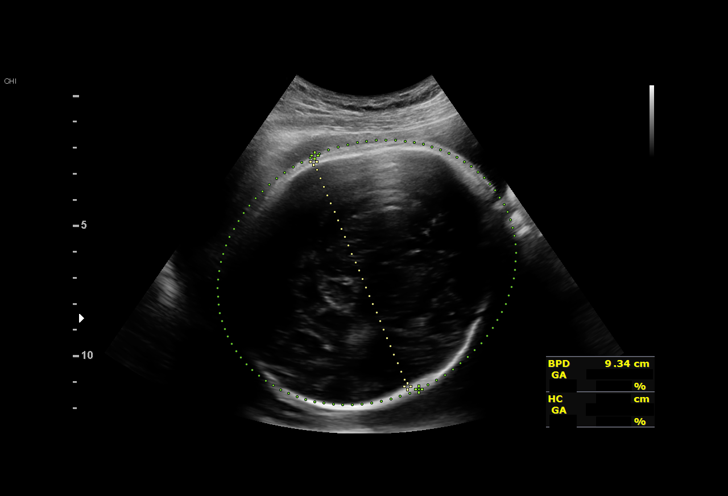
[im 61/82]
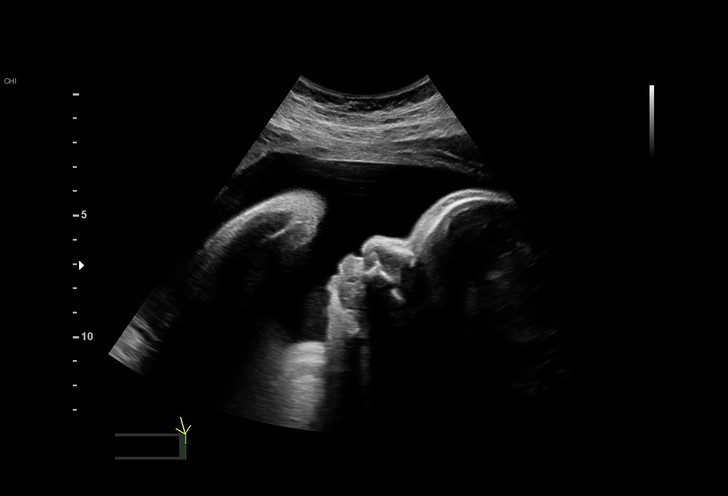
[im 67/82]
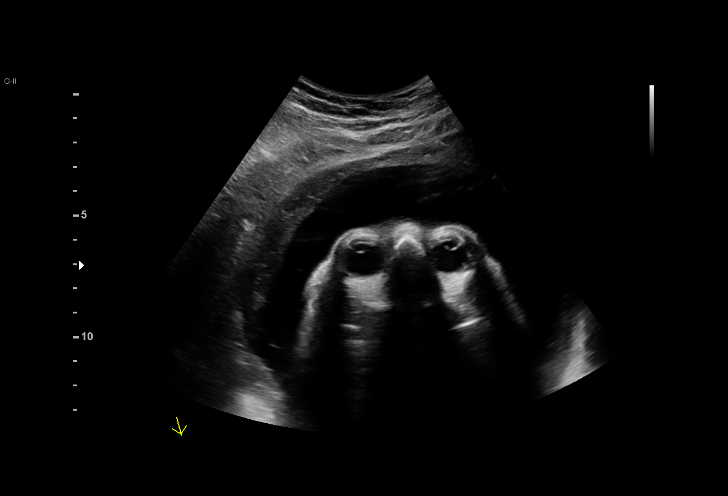
[im 73/82]
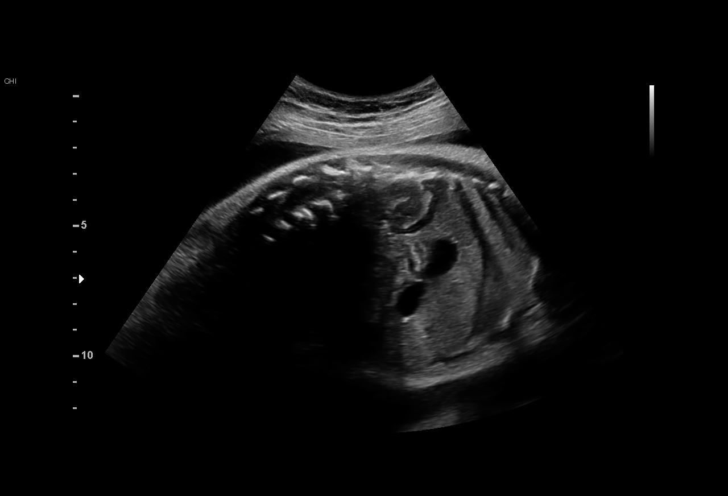
[im 79/82]
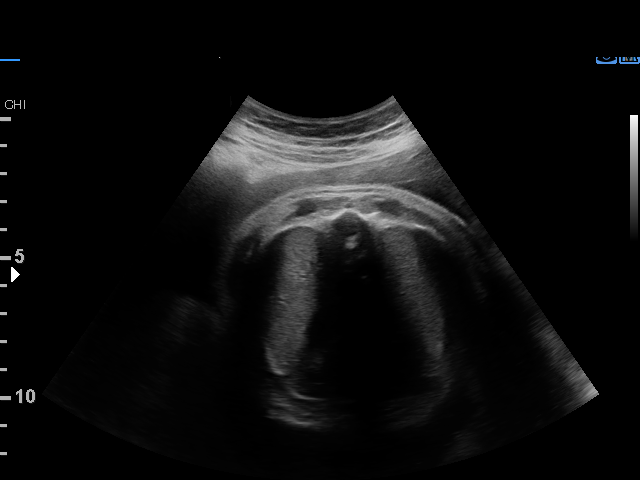

[13 of 28 positions shown; findings below may reference images not displayed]

W/NONSTRESS

Indications

 Polyhydramnios, third trimester, antepartum
 condition or complication, unspecified fetus
 Echogenic intracardiac focus of the heart
 (EIF)
 Asthma                                         PMM.TM [id number]
 LR NIPS, Neg Horizon, Neg AFP
 35 weeks gestation of pregnancy
Fetal Evaluation

 Num Of Fetuses:         1
 Fetal Heart Rate(bpm):  140
 Cardiac Activity:       Observed
 Presentation:           Cephalic
 Placenta:               Anterior
 P. Cord Insertion:      Visualized, central

 Amniotic Fluid
 AFI FV:      Polyhydramnios

 AFI Sum(cm)     %Tile       Largest Pocket(cm)
 25.5            95

 RUQ(cm)       RLQ(cm)       LUQ(cm)        LLQ(cm)

Biophysical Evaluation

 Amniotic F.V:   Pocket => 2 cm             F. Tone:        Observed
 F. Movement:    Observed                   N.S.T:          Nonreactive
 F. Breathing:   Not Observed               Score:          [DATE]
Biometry

 BPD:      93.5  mm     G. Age:  38w 0d         97  %    CI:        76.57   %    70 - 86
                                                         FL/HC:      19.4   %    20.1 -
 HC:      338.5  mm     G. Age:  38w 6d         89  %    HC/AC:      1.06        0.93 -
 AC:       320   mm     G. Age:  35w 6d         65  %    FL/BPD:     70.3   %    71 - 87
 FL:       65.7  mm     G. Age:  33w 6d          7  %    FL/AC:      20.5   %    20 - 24

 LV:        7.2  mm
 Est. FW:    9711  gm      6 lb 3 oz     56  %
OB History

 Gravidity:    4         Term:   1
 TOP:          2        Living:  1
Gestational Age

 LMP:           35w 5d        Date:  12/28/20                 EDD:   10/04/21
 U/S Today:     36w 5d                                        EDD:   09/27/21
 Best:          35w 5d     Det. By:  LMP  (12/28/20)          EDD:   10/04/21
Anatomy

 Cranium:               Appears normal         Aortic Arch:            Previously seen
 Cavum:                 Appears normal         Ductal Arch:            Previously seen
 Ventricles:            Appears normal         Diaphragm:              Appears normal
 Choroid Plexus:        Previously seen        Stomach:                Appears normal, left
                                                                       sided
 Cerebellum:            Previously seen        Abdomen:                Appears normal
 Posterior Fossa:       Previously seen        Abdominal Wall:         Appears nml (cord
                                                                       insert, abd wall)
 Nuchal Fold:           Previously seen        Cord Vessels:           Appears normal (3
                                                                       vessel cord)
 Face:                  Appears normal         Kidneys:                Appear normal
                        (orbits and profile)
 Lips:                  Previously seen        Bladder:                Appears normal
 Thoracic:              Appears normal         Spine:                  Previously seen
 Heart:                 Appears normal; EIF    Upper Extremities:      Previously seen
 RVOT:                  Previously seen        Lower Extremities:      Previously seen
 LVOT:                  Previously seen

 Other:  Female gender previously visualized. Heels and 5th digit  previously
         visualized. VC, 3VV and 3VTV previously visualized. Nasal bone and
         lenses visualized.
Cervix Uterus Adnexa

 Cervix
 Not visualized (advanced GA >01wks)
Comments

 Shamir Sakamoto was seen for a growth scan and
 biophysical profile due to polyhydramnios noted during her
 prior exams.  She has screened negative for gestational
 diabetes in her current pregnancy.
 The fetal growth measures appropriate for her gestational
 age (EFW 6 pounds 3 ounces, 58th percentile).
 Polyhydramnios with a total AFI of 25.5 cm continues to be
 noted today.
 A biophysical profile performed today was  [DATE].  She
 received a -2 for fetal breathing movements that did not meet
 criteria.  We then placed her on the fetal monitor for over an
 hour for an NST.
 The NST was uninterpretable as a consistent fetal heart rate
 tracing could not be obtained due to vigorous fetal
 movements, making her total biophysical profile score [DATE].
 Due to the [DATE] biophysical profile, the patient was sent
 to the hospital for prolonged monitoring.
 Should she be discharged home, she already has another
 BPP scheduled in our office in 1 week.
 A total of 20 minutes was spent counseling and coordinating
 the care for this patient.  Greater than 50% of the time was
 spent in direct face-to-face contact.

## 2023-09-23 NOTE — Telephone Encounter (Signed)
Called patient at number listed in chart--verified with full name and DOB. Relayed result information below as well as the MyChart message that the provider sent to the patient via MyChart message in regards to results. Patient verbalized understanding and had no other questions or concerns.   Offered to schedule patient for lab visit needed in mid-December--patient stated that she could not currently schedule that appointment until she has her work schedule. Told patient I would send a message to our front desk for scheduling and that if she does get her work schedule before we've contacted her for scheduling, then she can also reach out to [name] for scheduling. Patient verbalized understanding.   Maureen Ralphs RN on 09/23/23 at [employer]

## 2023-09-23 NOTE — Telephone Encounter (Signed)
-----   Message from [place] sent at 09/22/2023  6:01 PM EST ----- Needs labs only beta hcg visit for mid December. thanks

## 2023-10-01 ENCOUNTER — Ambulatory Visit: Payer: Medicaid Other | Admitting: Family Medicine

## 2023-10-15 ENCOUNTER — Other Ambulatory Visit: Payer: Medicaid Other

## 2023-12-03 ENCOUNTER — Ambulatory Visit
Admission: EM | Admit: 2023-12-03 | Discharge: 2023-12-03 | Disposition: A | Discharge status: Home / Self Care | Payer: Medicaid Other | Attending: Family Medicine | Admitting: Family Medicine

## 2023-12-03 ENCOUNTER — Other Ambulatory Visit: Payer: Self-pay

## 2023-12-03 DIAGNOSIS — J4521 Mild intermittent asthma with (acute) exacerbation: Secondary | ICD-10-CM | POA: Diagnosis not present

## 2023-12-03 DIAGNOSIS — R6889 Other general symptoms and signs: Secondary | ICD-10-CM | POA: Diagnosis not present

## 2023-12-03 MED ORDER — PROMETHAZINE-DM 6.25-15 MG/5ML PO SYRP: 5.0000 mL | ORAL_SOLUTION | Freq: Four times a day (QID) | ORAL | 0 refills | Status: DC | PRN

## 2023-12-03 MED ORDER — OSELTAMIVIR PHOSPHATE 75 MG PO CAPS: 75.0000 mg | ORAL_CAPSULE | Freq: Two times a day (BID) | ORAL | 0 refills | Status: DC

## 2023-12-03 MED ORDER — ALBUTEROL SULFATE HFA 108 (90 BASE) MCG/ACT IN AERS: 1.0000 | INHALATION_SPRAY | Freq: Four times a day (QID) | RESPIRATORY_TRACT | 0 refills | Status: DC | PRN

## 2023-12-03 MED ORDER — PREDNISONE 20 MG PO TABS
40.0000 mg | ORAL_TABLET | Freq: Every day | ORAL | 0 refills | Status: AC
Start: 2023-12-03 — End: 2023-12-08

## 2023-12-03 MED ORDER — ALBUTEROL SULFATE (2.5 MG/3ML) 0.083% IN NEBU: 2.5000 mg | INHALATION_SOLUTION | Freq: Four times a day (QID) | RESPIRATORY_TRACT | 0 refills | Status: DC | PRN

## 2023-12-03 NOTE — ED Provider Notes (Signed)
[facility]    CSN: [id number] Arrival date & time: 12/03/23  1235      History   Chief Complaint Chief Complaint  Patient presents with   Cough    HPI Eileen Graham is a 28 y.o. female  presents for evaluation of URI symptoms for 2 days. Patient reports associated symptoms of cough, congestion, body aches, chills, sore throat, diarrhea, wheezing. Denies nausea vomiting or ear pain. Patient does have a hx of asthma.  She is out of her albuterol inhaler at home nebulizer.  Patient is not an active smoker.   Reports no sick contacts.  Pt has taken cough medicine OTC for symptoms.  Patient denies pregnancy or breast-feeding.  Chart review shows recently pregnant as of 11/04/2023 but patient states she is no longer pregnant.  Pt has no other concerns at this time.    Cough Associated symptoms: chills, myalgias, sore throat and wheezing     Past Medical History:  Diagnosis Date   Alopecia    Asthma    Headache    Hyperemesis gravidarum 08/10/2019   Infection    UTI   Postpartum hypertension 11/21/2021   Shingles    Supervision of high-risk pregnancy 03/06/2021              Nursing Staff    Provider      Office Location     [facility]    Dating     LMP      Language     English    Anatomy US     WNL with EIF       Flu Vaccine     NA    Genetic/Carrier Screen     NIPS: low risk   AFP:     Horizon: neg 4/4      TDaP Vaccine      08/07/21    Hgb A1C or   GTT    Early   Third trimester neg      COVID Vaccine    No         LAB RESULTS       Rhogam     NA    Blood T   Trichomonas infection     Patient Active Problem List   Diagnosis Date Noted   Transient hypertension 09/09/2023   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 Authorizing Provider  albuterol (PROVENTIL) (2.5 MG/3ML) 0.083% nebulizer solution Take 3 mLs (2.5 mg total) by nebulization every 6 (six) hours as needed for wheezing or shortness of breath. 12/03/23  Yes Radford Pax, NP  albuterol (VENTOLIN HFA) 108 (90 Base) MCG/ACT inhaler Inhale 1-2 puffs into the lungs every 6 (six) hours as needed. 12/03/23  Yes Radford Pax, NP  oseltamivir (TAMIFLU) 75 MG capsule Take 1 capsule (75 mg total) by mouth every 12 (twelve) hours. 12/03/23  Yes Radford Pax, NP  predniSONE (DELTASONE) 20 MG tablet Take 2 tablets (40 mg total) by mouth daily with breakfast for 5 days. 12/03/23 12/08/23 Yes Radford Pax, NP  promethazine-dextromethorphan (PROMETHAZINE-DM) 6.25-15 MG/5ML syrup Take 5 mLs by mouth 4 (four) times daily as needed for cough. 12/03/23  Yes Radford Pax, NP  Drospirenone (SLYND) 4 MG TABS Take 1 tablet (4 mg total) by mouth daily at 6 (six) AM. 09/09/23   Glendora Bing, MD  Heating Pads PADS 1 Pad by Does not apply route as needed. 08/01/23   Bethany Bing, MD  montelukast (SINGULAIR) 10 MG tablet Take 1 tablet (10 mg total) by mouth daily as needed (allergies). 09/09/23    Bing, MD  famotidine (PEPCID) 40 MG tablet Take 1 tablet (40 mg total) by mouth every evening. 08/09/19 06/12/20  Nugent, Odie Sera, NP  glycopyrrolate (ROBINUL) 2 MG tablet Take 1 tablet (2 mg total) by mouth 3 (three) times daily as needed. 08/07/19 06/12/20  Leftwich-Kirby, Wilmer Floor, CNM  loratadine (CLARITIN) 10 MG tablet Take 1 tablet (10 mg total) by mouth daily. 02/19/19 06/12/20  Wieters, Hallie C, PA-C  metoCLOPramide (REGLAN) 10 MG  tablet Take 1 tablet (10 mg total) by mouth 3 (three) times daily with meals. 08/09/19 06/12/20  Nugent, Odie Sera, NP  potassium chloride (KLOR-CON) 10 MEQ tablet Take 1 tablet (10 mEq total) by mouth daily. 08/09/19 06/12/20  Nugent, Odie Sera, NP    Family History Family History  Problem Relation Age of Onset   Hypertension Mother    Healthy Father    Asthma Maternal Grandfather    Congestive Heart Failure Maternal Grandfather     Social History Social History   Tobacco Use   Smoking status: Former    Types: Cigarettes   Smokeless tobacco: Never   Tobacco comments:    "years ago"  Vaping Use   Vaping status: Every Day   Devices: stopped with +UPT  Substance Use Topics   Alcohol use: Yes    Comment: occ   Drug use: Not Currently     Allergies   Latex   Review of Systems Review of Systems  Constitutional:  Positive for chills.  HENT:  Positive for congestion and sore throat.   Respiratory:  Positive for cough and wheezing.   Musculoskeletal:  Positive for myalgias.     Physical Exam Triage Vital Signs ED Triage Vitals  Encounter Vitals Group     BP 12/03/23 1330 122/81     Systolic BP Percentile --      Diastolic BP Percentile --      Pulse Rate 12/03/23 1330 (!) 110     Resp 12/03/23 1330 18     Temp 12/03/23 1330 99.9 F (37.7 C)     Temp Source 12/03/23 1330 Oral     SpO2 12/03/23 1330 96 %     Weight --      Height --      Head Circumference --      Peak Flow --      Pain Score 12/03/23 1332 10     Pain Loc --      Pain Education --      Exclude from  Growth Chart --    No data found.  Updated Vital Signs BP 122/81 (BP Location: Right Arm)   Pulse (!) 110   Temp 99.9 F (37.7 C) (Oral)   Resp 18   SpO2 96%   Visual Acuity Right Eye Distance:   Left Eye Distance:   Bilateral Distance:    Right Eye Near:   Left Eye Near:    Bilateral Near:     Physical Exam Vitals and nursing note reviewed.  Constitutional:      General: She is not in  acute distress.    Appearance: She is well-developed. She is not ill-appearing.  HENT:     Head: Normocephalic and atraumatic.     Right Ear: Tympanic membrane and ear canal normal.     Left Ear: Tympanic membrane and ear canal normal.     Nose: Congestion present.     Mouth/Throat:     Mouth: Mucous membranes are moist.     Pharynx: Oropharynx is clear. Uvula midline. No oropharyngeal exudate or posterior oropharyngeal erythema.     Tonsils: No tonsillar exudate or tonsillar abscesses.  Eyes:     Conjunctiva/sclera: Conjunctivae normal.     Pupils: Pupils are equal, round, and reactive to light.  Cardiovascular:     Rate and Rhythm: Regular rhythm. Tachycardia present.     Heart sounds: Normal heart sounds.     Comments: Tachycardia at 110 and low-grade fever Pulmonary:     Effort: Pulmonary effort is normal.     Breath sounds: Normal breath sounds. No wheezing.  Musculoskeletal:     Cervical back: Normal range of motion and neck supple.  Lymphadenopathy:     Cervical: No cervical adenopathy.  Skin:    General: Skin is warm and dry.  Neurological:     General: No focal deficit present.     Mental Status: She is alert and oriented to person, place, and time.  Psychiatric:        Mood and Affect: Mood normal.        Behavior: Behavior normal.      UC Treatments / Results  Labs (all labs ordered are listed, but only abnormal results are displayed) Labs Reviewed - No data to display  EKG   Radiology No results found.  Procedures Procedures (including critical care time)  Medications Ordered in UC Medications - No data to display  Initial Impression / Assessment and Plan / UC Course  I have reviewed the triage vital signs and the nursing notes.  Pertinent labs & imaging results that were available during my care of the patient were reviewed by me and considered in my medical decision making (see chart for details).     Reviewed exam and symptoms with patient.   No red flags.  Will start Tamiflu for influenza-like symptoms.  She will albuterol inhaler and nebulizer solution.  Promethazine DM as needed for cough, side effect profile reviewed.  Prednisone daily for 5 days.  Discussed rest and fluids.  PCP follow-up as symptoms do not improve.  ER precautions reviewed. Final Clinical Impressions(s) / UC Diagnoses   Final diagnoses:  Influenza-like symptoms  Mild intermittent asthma with acute exacerbation     Discharge Instructions      Start Tamiflu twice daily for 5 days.  I have refilled your albuterol inhaler and nebulizer solution to use for your wheezing or shortness of breath.  Prednisone daily for 5 days.  You may take Promethazine DM as needed for cough.  Please note this medication can make you drowsy.  Do not drink alcohol or drive while on this medication.  Lots of rest and fluids.  Please follow-up with your PCP if your symptoms do not improve.  Please go to the ER if you develop any worsening symptoms.  I hope you feel better soon!    ED Prescriptions     Medication Sig Dispense Auth. Provider   oseltamivir (TAMIFLU) 75 MG capsule Take 1 capsule (75 mg total) by mouth every 12 (twelve) hours. 10 capsule Radford Pax, NP   albuterol (VENTOLIN HFA) 108 (90 Base) MCG/ACT inhaler Inhale 1-2 puffs into the lungs every 6 (six) hours as needed. 1 each Radford Pax, NP   albuterol (PROVENTIL) (2.5 MG/3ML) 0.083% nebulizer solution Take 3 mLs (2.5 mg total) by nebulization every 6 (six) hours as needed for wheezing or shortness of breath. 75 mL Radford Pax, NP   promethazine-dextromethorphan (PROMETHAZINE-DM) 6.25-15 MG/5ML syrup Take 5 mLs by mouth 4 (four) times daily as needed for cough. 118 mL Radford Pax, NP   predniSONE (DELTASONE) 20 MG tablet Take 2 tablets (40 mg total) by mouth daily with breakfast for 5 days. 10 tablet Radford Pax, NP      PDMP not reviewed this encounter.   Radford Pax, NP 12/03/23 1401

## 2023-12-03 NOTE — Discharge Instructions (Addendum)
Start Tamiflu twice daily for 5 days.  I have refilled your albuterol inhaler and nebulizer solution to use for your wheezing or shortness of breath.  Prednisone daily for 5 days.  You may take Promethazine DM as needed for cough.  Please note this medication can make you drowsy.  Do not drink alcohol or drive while on this medication.  Lots of rest and fluids.  Please follow-up with your PCP if your symptoms do not improve.  Please go to the ER if you develop any worsening symptoms.  I hope you feel better soon!

## 2023-12-03 NOTE — ED Triage Notes (Signed)
C/O  flu like symptoms x 4 days. C/O body aches , chills, non productive cough, sore thraot, diarrhea yesterday. Taking ibuprofen OTC for symptoms.

## 2023-12-24 ENCOUNTER — Telehealth: Payer: Medicaid Other | Admitting: Physician Assistant

## 2023-12-24 DIAGNOSIS — T3695XA Adverse effect of unspecified systemic antibiotic, initial encounter: Secondary | ICD-10-CM | POA: Diagnosis not present

## 2023-12-24 DIAGNOSIS — N760 Acute vaginitis: Secondary | ICD-10-CM

## 2023-12-24 DIAGNOSIS — B379 Candidiasis, unspecified: Secondary | ICD-10-CM | POA: Diagnosis not present

## 2023-12-24 DIAGNOSIS — B9689 Other specified bacterial agents as the cause of diseases classified elsewhere: Secondary | ICD-10-CM

## 2023-12-24 MED ORDER — FLUCONAZOLE 150 MG PO TABS
150.0000 mg | ORAL_TABLET | ORAL | 0 refills | Status: DC | PRN
Start: 2023-12-24 — End: 2024-02-19

## 2023-12-24 MED ORDER — METRONIDAZOLE 500 MG PO TABS
500.0000 mg | ORAL_TABLET | Freq: Two times a day (BID) | ORAL | 0 refills | Status: AC
Start: 2023-12-24 — End: 2023-12-31

## 2023-12-24 NOTE — Patient Instructions (Signed)
Eileen Graham, thank you for joining Margaretann Loveless, PA-C for today's virtual visit.  While this provider is not your primary care provider (PCP), if your PCP is located in our provider database this encounter information will be shared with them immediately following your visit.   A [facility] MyChart account gives you access to today's visit and all your visits, tests, and labs performed at [facility] " click here if you don't have a [facility] MyChart account or go to [email or web address]  Consent: (Patient) Eileen Graham provided verbal consent for this virtual visit at the beginning of the encounter.  Current Medications:  Current Outpatient Medications:    fluconazole (DIFLUCAN) 150 MG tablet, Take 1 tablet (150 mg total) by mouth every 3 (three) days as needed., Disp: 2 tablet, Rfl: 0   metroNIDAZOLE (FLAGYL) 500 MG tablet, Take 1 tablet (500 mg total) by mouth 2 (two) times daily for 7 days., Disp: 14 tablet, Rfl: 0   albuterol (PROVENTIL) (2.5 MG/3ML) 0.083% nebulizer solution, Take 3 mLs (2.5 mg total) by nebulization every 6 (six) hours as needed for wheezing or shortness of breath., Disp: 75 mL, Rfl: 0   albuterol (VENTOLIN HFA) 108 (90 Base) MCG/ACT inhaler, Inhale 1-2 puffs into the lungs every 6 (six) hours as needed., Disp: 1 each, Rfl: 0   Drospirenone (SLYND) 4 MG TABS, Take 1 tablet (4 mg total) by mouth daily at 6 (six) AM., Disp: 90 tablet, Rfl: 3   Heating Pads PADS, 1 Pad by Does not apply route as needed., Disp: , Rfl:    montelukast (SINGULAIR) 10 MG tablet, Take 1 tablet (10 mg total) by mouth daily as needed (allergies)., Disp: 30 tablet, Rfl: 3   oseltamivir (TAMIFLU) 75 MG capsule, Take 1 capsule (75 mg total) by mouth every 12 (twelve) hours., Disp: 10 capsule, Rfl: 0   promethazine-dextromethorphan (PROMETHAZINE-DM) 6.25-15 MG/5ML syrup, Take 5 mLs by mouth 4 (four) times daily as needed for cough., Disp: 118 mL, Rfl: 0    Medications ordered in this encounter:  Meds ordered this encounter  Medications   metroNIDAZOLE (FLAGYL) 500 MG tablet    Sig: Take 1 tablet (500 mg total) by mouth 2 (two) times daily for 7 days.    Dispense:  14 tablet    Refill:  0    Supervising Provider:   Merrilee Jansky [[id number]]   fluconazole (DIFLUCAN) 150 MG tablet    Sig: Take 1 tablet (150 mg total) by mouth every 3 (three) days as needed.    Dispense:  2 tablet    Refill:  0    Supervising Provider:   Merrilee Jansky [[id number]]     *If you need refills on other medications prior to your next appointment, please contact your pharmacy*  Follow-Up: Call back or seek an in-person evaluation if the symptoms worsen or if the condition fails to improve as anticipated.  [facility] Virtual [facility] [phone]  Other Instructions  Vaginal Infection (Bacterial Vaginosis): What to Know  Bacterial vaginosis is an infection of the vagina. It happens when the balance of normal germs (bacteria) in the vagina changes. It's common among females ages 33 to 6. If left untreated, it can increase your risk of getting a sexually transmitted infection (STI). If you're pregnant, you need to get treated right away. This infection can cause a baby to be born early or at a low birth weight. What are the causes? This happens when too many harmful germs  grow in the vagina. The exact reason why this happens isn't known. You can't get this infection from toilet seats, bedding, swimming pools, or contact with objects around you. What increases the risk? Having new or multiple sexual partners, or unprotected sex. Douching. Using an intrauterine device (IUD). Smoking. Alcohol and drug abuse. Taking certain antibiotics. Being pregnant. You can get a vaginal infection without being sexually active. However, it most often occurs in sexually active females. What are the signs or symptoms? Some females have no symptoms. If you have symptoms,  they may include: Wallace Cullens or white vaginal discharge. It can be watery or foamy. A fish-like smell, especially after sex or during your menstrual period. Itching in and around the vagina. Burning or pain with peeing. How is this diagnosed? This infection is diagnosed based on: Your medical history. A physical exam of the vagina. Checking a sample of vaginal fluid for harmful bacteria or uncommon cells. How is this treated? This condition is treated with antibiotics. These may be given as: A pill. A cream for your vagina. A medicine that you put into your vagina called a suppository. If the infection comes back, you may need more antibiotics. Follow these instructions at home: Medicines Take your medicines only as told. Take or apply your antibiotics as told. Do not stop using them even if you start to feel better. General instructions If you have a female sexual partner, tell her about the infection. She should see her health care provider. Female partners don't need treatment. Avoid sex until treatment is complete. Drink more fluids as told. Keep the area around your vagina and rectum clean. Wash the area daily with warm water. Wipe yourself from front to back after pooping. If you're breastfeeding, talk to your provider about continuing during treatment. How is this prevented? Self-care Do not douche or use vaginal deodorant sprays. Douching can upset the balance of good and harmful bacteria in the vagina, which can cause an infection to happen again. Wear cotton or cotton-lined underwear. Avoid wearing tight pants or pantyhose, especially in the summer. Safe sex Use condoms correctly and every time you have sex. Use dental dams to protect yourself during oral sex. Limit the number of sexual partners. Get tested for STIs. Your sexual partner should also get tested. Drugs and alcohol Do not smoke, vape, or use nicotine or tobacco. Do not use drugs. Limit the amount of alcohol  you drink because it can lead to risky sexual behavior. Where to find more information To learn more: Go to TonerPromos.no. Click Health Topics A-Z. Type "bacterial vaginosis" in the search box. American Sexual Health Association (ASHA): ashasexualhealth.org U.S. Department of Health and Health and safety inspector, Office on Women's Health: TravelLesson.ca Contact a health care provider if: Your symptoms don't get better, even after treatment. You have more discharge or pain when peeing. You have a fever or chills. You have pain in your belly or pelvis. You have pain during sex. You have vaginal bleeding between menstrual periods. This information is not intended to replace advice given to you by your health care provider. Make sure you discuss any questions you have with your health care provider. Document Revised: 04/09/2023 Document Reviewed: 04/09/2023 Elsevier Patient Education  2024 Elsevier Inc.   If you have been instructed to have an in-person evaluation today at a local Urgent Care facility, please use the link below. It will take you to a list of all of our available  Urgent Cares, including address, phone number and hours  of operation. Please do not delay care.  [facility] Urgent C[facility]  If you or a family member do not have a primary care provider, use the link below to schedule a visit and establish care. When you choose a [facility] primary care physician or advanced practice provider, you gain a long-term partner in health. Find a Primary Care Provider  Learn more about [facility]'s in-office and virtual care options: [facility] - Get Care Now

## 2023-12-24 NOTE — Progress Notes (Signed)
Virtual Visit Consent   Eileen Graham, you are scheduled for a virtual visit with a [facility] provider today. Just as with appointments in the office, your consent must be obtained to participate. Your consent will be active for this visit and any virtual visit you may have with one of our providers in the next 365 days. If you have a MyChart account, a copy of this consent can be sent to you electronically.  As this is a virtual visit, video technology does not allow for your provider to perform a traditional examination. This may limit your provider's ability to fully assess your condition. If your provider identifies any concerns that need to be evaluated in person or the need to arrange testing (such as labs, EKG, etc.), we will make arrangements to do so. Although advances in technology are sophisticated, we cannot ensure that it will always work on either your end or our end. If the connection with a video visit is poor, the visit may have to be switched to a telephone visit. With either a video or telephone visit, we are not always able to ensure that we have a secure connection.  By engaging in this virtual visit, you consent to the provision of healthcare and authorize for your insurance to be billed (if applicable) for the services provided during this visit. Depending on your insurance coverage, you may receive a charge related to this service.  I need to obtain your verbal consent now. Are you willing to proceed with your visit today? Maryssa Mekiah Wahler has provided verbal consent on 12/24/2023 for a virtual visit (video or telephone). Margaretann Loveless, PA-C  Date: 12/24/2023 11:42 AM   Virtual Visit via Video Note   I, Margaretann Loveless, connected with  Eileen Graham  ([id number], 12/14/1995) on 12/24/23 at 11:45 AM EST by a video-enabled telemedicine application and verified that I am speaking with the correct person using two identifiers.  Location: Patient:  Virtual Visit Location Patient: Home Provider: Virtual Visit Location Provider: Home Office   I discussed the limitations of evaluation and management by telemedicine and the availability of in person appointments. The patient expressed understanding and agreed to proceed.    History of Present Illness: Eileen Graham is a 28 y.o. who identifies as a female who was assigned female at birth, and is being seen today for vaginal discharge.  HPI: Vaginal Discharge The patient's primary symptoms include a genital odor and vaginal discharge. This is a new problem. The current episode started in the past 7 days (2 days). The problem occurs constantly. The problem has been unchanged. Associated symptoms include back pain (very mild). Pertinent negatives include no abdominal pain, chills, dysuria, fever, flank pain, frequency or nausea. The vaginal discharge was malodorous, white and milky. There has been no bleeding. She has not been passing clots. She has not been passing tissue. Nothing aggravates the symptoms. Treatments tried: vaginal probiotics. The treatment provided no relief. She is sexually active. No, her partner does not have an STD.     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   Asthma 03/21/2021   Allergic rhinitis 08/10/2019    Allergies:  Allergies  Allergen Reactions   Latex Itching   Medications:  Current Outpatient Medications:    fluconazole (DIFLUCAN) 150 MG tablet, Take 1 tablet (150 mg total) by mouth every 3 (three) days as needed., Disp: 2 tablet,  Rfl: 0   metroNIDAZOLE (FLAGYL) 500 MG tablet, Take 1 tablet (500 mg total) by mouth 2 (two) times daily for 7 days., Disp: 14 tablet, Rfl: 0   albuterol (PROVENTIL) (2.5 MG/3ML) 0.083% nebulizer solution, Take 3 mLs (2.5 mg total) by nebulization every 6 (six) hours as needed for wheezing or shortness of breath., Disp: 75 mL, Rfl: 0    albuterol (VENTOLIN HFA) 108 (90 Base) MCG/ACT inhaler, Inhale 1-2 puffs into the lungs every 6 (six) hours as needed., Disp: 1 each, Rfl: 0   Drospirenone (SLYND) 4 MG TABS, Take 1 tablet (4 mg total) by mouth daily at 6 (six) AM., Disp: 90 tablet, Rfl: 3   Heating Pads PADS, 1 Pad by Does not apply route as needed., Disp: , Rfl:    montelukast (SINGULAIR) 10 MG tablet, Take 1 tablet (10 mg total) by mouth daily as needed (allergies)., Disp: 30 tablet, Rfl: 3   oseltamivir (TAMIFLU) 75 MG capsule, Take 1 capsule (75 mg total) by mouth every 12 (twelve) hours., Disp: 10 capsule, Rfl: 0   promethazine-dextromethorphan (PROMETHAZINE-DM) 6.25-15 MG/5ML syrup, Take 5 mLs by mouth 4 (four) times daily as needed for cough., Disp: 118 mL, Rfl: 0  Observations/Objective: Patient is well-developed, well-nourished in no acute distress.  Resting comfortably at home.  Head is normocephalic, atraumatic.  No labored breathing.  Speech is clear and coherent with logical content.  Patient is alert and oriented at baseline.    Assessment and Plan: 1. BV (bacterial vaginosis) (Primary) - metroNIDAZOLE (FLAGYL) 500 MG tablet; Take 1 tablet (500 mg total) by mouth 2 (two) times daily for 7 days.  Dispense: 14 tablet; Refill: 0  2. Antibiotic-induced yeast infection - fluconazole (DIFLUCAN) 150 MG tablet; Take 1 tablet (150 mg total) by mouth every 3 (three) days as needed.  Dispense: 2 tablet; Refill: 0  - Symptoms consistent with BV - Metronidazole prescribed - Limit bubble baths, scented lotions/soaps/detergents - Limit tight fitting clothing - Diflucan given as prophylaxis as patient tends to get vaginal yeast infections with antibiotic use - Seek on person evaluation if not improving or if symptoms worsen   Follow Up Instructions: I discussed the assessment and treatment plan with the patient. The patient was provided an opportunity to ask questions and all were answered. The patient agreed with the  plan and demonstrated an understanding of the instructions.  A copy of instructions were sent to the patient via MyChart unless otherwise noted below.    The patient was advised to call back or seek an in-person evaluation if the symptoms worsen or if the condition fails to improve as anticipated.    Margaretann Loveless, PA-C

## 2024-02-13 ENCOUNTER — Other Ambulatory Visit: Payer: Self-pay | Admitting: Obstetrics and Gynecology

## 2024-02-13 DIAGNOSIS — J4521 Mild intermittent asthma with (acute) exacerbation: Secondary | ICD-10-CM

## 2024-02-19 ENCOUNTER — Ambulatory Visit
Admission: EM | Admit: 2024-02-19 | Discharge: 2024-02-19 | Disposition: A | Discharge status: Home / Self Care | Attending: Family Medicine | Admitting: Family Medicine

## 2024-02-19 DIAGNOSIS — B9689 Other specified bacterial agents as the cause of diseases classified elsewhere: Secondary | ICD-10-CM | POA: Diagnosis present

## 2024-02-19 DIAGNOSIS — R3915 Urgency of urination: Secondary | ICD-10-CM | POA: Insufficient documentation

## 2024-02-19 DIAGNOSIS — N760 Acute vaginitis: Secondary | ICD-10-CM | POA: Insufficient documentation

## 2024-02-19 LAB — POCT URINALYSIS DIP (MANUAL ENTRY)
Bilirubin, UA: NEGATIVE
Blood, UA: NEGATIVE
Glucose, UA: NEGATIVE mg/dL
Ketones, POC UA: NEGATIVE mg/dL
Leukocytes, UA: NEGATIVE
Nitrite, UA: NEGATIVE
Protein Ur, POC: NEGATIVE mg/dL
Spec Grav, UA: <=1.005 — AB
Urobilinogen, UA: 0.2 U/dL
pH, UA: 5

## 2024-02-19 LAB — POCT URINE PREGNANCY: Preg Test, Ur: NEGATIVE

## 2024-02-19 MED ORDER — FLUCONAZOLE 150 MG PO TABS
150.0000 mg | ORAL_TABLET | ORAL | 0 refills | Status: DC
Start: 2024-02-19 — End: 2024-03-31

## 2024-02-19 MED ORDER — METRONIDAZOLE 500 MG PO TABS: 500.0000 mg | ORAL_TABLET | Freq: Two times a day (BID) | ORAL | 0 refills | Status: DC

## 2024-02-19 NOTE — ED Provider Notes (Addendum)
 [facility] - URGENT CARE C[facility]  Note:  This document was prepared using Conservation officer, historic buildings and may include unintentional dictation errors.  MRN: [id number] DOB: 09-30-1996  Subjective:   Eileen Graham is a 28 y.o. female presenting for 2-day history of vaginal discharge, urinary urgency.  Denies fever, n/v, abdominal pain, pelvic pain, rashes, dysuria, urinary frequency, hematuria.  She took a Plan B this month.  Would like a pregnancy test.  Would also like a complete vaginal cytology test.  She is requesting empiric treatment for BV and yeast infection as she has had this infection before and feels that she knows this is recurrent for her today.    No current facility-administered medications for this encounter.  Current Outpatient Medications:    albuterol (PROVENTIL) (2.5 MG/3ML) 0.083% nebulizer solution, Take 3 mLs (2.5 mg total) by nebulization every 6 (six) hours as needed for wheezing or shortness of breath., Disp: 75 mL, Rfl: 0   albuterol (VENTOLIN HFA) 108 (90 Base) MCG/ACT inhaler, Inhale 1-2 puffs into the lungs every 6 (six) hours as needed., Disp: 1 each, Rfl: 0   Drospirenone (SLYND) 4 MG TABS, Take 1 tablet (4 mg total) by mouth daily at 6 (six) AM., Disp: 90 tablet, Rfl: 3   fluconazole (DIFLUCAN) 150 MG tablet, Take 1 tablet (150 mg total) by mouth every 3 (three) days as needed., Disp: 2 tablet, Rfl: 0   Heating Pads PADS, 1 Pad by Does not apply route as needed., Disp: , Rfl:    montelukast (SINGULAIR) 10 MG tablet, Take 1 tablet (10 mg total) by mouth daily as needed (allergies)., Disp: 30 tablet, Rfl: 3   oseltamivir (TAMIFLU) 75 MG capsule, Take 1 capsule (75 mg total) by mouth every 12 (twelve) hours., Disp: 10 capsule, Rfl: 0   promethazine-dextromethorphan (PROMETHAZINE-DM) 6.25-15 MG/5ML syrup, Take 5 mLs by mouth 4 (four) times daily as needed for cough., Disp: 118 mL, Rfl: 0   Allergies  Allergen Reactions   Latex Itching     Past Medical History:  Diagnosis Date   Alopecia    Asthma    Headache    Hyperemesis gravidarum 08/10/2019   Infection    UTI   Postpartum hypertension 11/21/2021   Shingles    Supervision of high-risk pregnancy 03/06/2021              Nursing Staff    Provider      Office Location     [facility]    Dating     LMP      Language     English    Anatomy US     WNL with EIF       Flu Vaccine     NA    Genetic/Carrier Screen     NIPS: low risk   AFP:     [place]: neg 4/4      TDaP Vaccine      08/07/21    Hgb A1C or   GTT    Early   Third trimester neg      COVID Vaccine    No         LAB RESULTS       Rhogam     NA    Blood T   Trichomonas infection      Past Surgical History:  Procedure Laterality Date   abortion     x2   DILATION AND EVACUATION N/A 08/01/2023   Procedure: DILATATION AND EVACUATION;  Surgeon: Vergie Living,  Dorla Gartner, MD;  Location: [facility] OR;  Service: Gynecology;  Laterality: N/A;   NO PAST SURGERIES      Family History  Problem Relation Age of Onset   Hypertension Mother    Healthy Father    Asthma Maternal Grandfather    Congestive Heart Failure Maternal Grandfather     Social History   Tobacco Use   Smoking status: Former    Types: Cigarettes   Smokeless tobacco: Never   Tobacco comments:    "years ago"  Vaping Use   Vaping status: Former   Devices: stopped with +UPT  Substance Use Topics   Alcohol use: Yes    Comment: occ   Drug use: Not Currently    ROS   Objective:   Vitals: BP 124/82 (BP Location: Left Arm)   Pulse 73   Temp 98.1 F (36.7 C) (Oral)   Resp 16   LMP 02/01/2024   SpO2 98%   Physical Exam Constitutional:      General: She is not in acute distress.    Appearance: Normal appearance. She is well-developed. She is not ill-appearing, toxic-appearing or diaphoretic.  HENT:     Head: Normocephalic and atraumatic.     Nose: Nose normal.     Mouth/Throat:     Mouth: Mucous membranes are moist.  Eyes:     General: No scleral  icterus.       Right eye: No discharge.        Left eye: No discharge.     Extraocular Movements: Extraocular movements intact.     Conjunctiva/sclera: Conjunctivae normal.  Cardiovascular:     Rate and Rhythm: Normal rate.  Pulmonary:     Effort: Pulmonary effort is normal.  Abdominal:     General: Bowel sounds are normal. There is no distension.     Palpations: Abdomen is soft. There is no mass.     Tenderness: There is no abdominal tenderness. There is no right CVA tenderness, left CVA tenderness, guarding or rebound.  Skin:    General: Skin is warm and dry.  Neurological:     General: No focal deficit present.     Mental Status: She is alert and oriented to person, place, and time.  Psychiatric:        Mood and Affect: Mood normal.        Behavior: Behavior normal.        Thought Content: Thought content normal.        Judgment: Judgment normal.     Results for orders placed or performed during the hospital encounter of 02/19/24 (from the past 24 hours)  POCT urine pregnancy     Status: Normal   Collection Time: 02/19/24 12:47 PM  Result Value Ref Range   Preg Test, Ur Negative   POCT urinalysis dipstick     Status: Abnormal   Collection Time: 02/19/24 12:47 PM  Result Value Ref Range   Color, UA light yellow (A)    Clarity, UA clear    Glucose, UA negative mg/dL   Bilirubin, UA negative    Ketones, POC UA negative mg/dL   Spec Grav, UA <=2.725 (A)    Blood, UA negative    pH, UA 5.0    Protein Ur, POC negative mg/dL   Urobilinogen, UA 0.2 E.U./dL   Nitrite, UA Negative    Leukocytes, UA Negative     Assessment and Plan :   PDMP not reviewed this encounter.  1. Bacterial vaginosis   2. Urinary  urgency    I was agreeable to treating patient empirically with Flagyl for BV and fluconazole for yeast infection.  Hydrate well, avoid urinary irritants.  Labs pending, will treat as appropriate otherwise.  No signs of an acute gynecologic emergency, PID.  Counseled  patient on potential for adverse effects with medications prescribed/recommended today, ER and return-to-clinic precautions discussed, patient verbalized understanding.     Adolph Hoop, PA-C 02/19/24 1254

## 2024-02-19 NOTE — ED Notes (Signed)
 Marland Kitchen

## 2024-02-19 NOTE — Discharge Instructions (Signed)
 Your test results will be available tomorrow for his vaginal swab.  At your request I am providing you with a prescription for suspected recurrent BV infection.  Make sure you hydrate very well with plain water and a quantity of 80 ounces of water a day.  Please limit drinks that are considered urinary irritants such as soda, sweet tea, coffee, energy drinks, alcohol.  These can worsen your urinary and genital symptoms but also be the source of them.  I will let you know about your urine culture results through MyChart to see if we need to prescribe or change your antibiotics based off of those results.

## 2024-02-19 NOTE — ED Triage Notes (Signed)
 Pt c/o vaginal d/c x 2 days-also c/o "slight urge to keep peeing"-NAD-steady gait

## 2024-02-20 LAB — CERVICOVAGINAL ANCILLARY ONLY
Bacterial Vaginitis (gardnerella): POSITIVE — AB
Candida Glabrata: NEGATIVE
Candida Vaginitis: NEGATIVE
Chlamydia: NEGATIVE
Comment: NEGATIVE
Comment: NEGATIVE
Comment: NEGATIVE
Comment: NEGATIVE
Comment: NEGATIVE
Comment: NORMAL
Neisseria Gonorrhea: NEGATIVE
Trichomonas: NEGATIVE

## 2024-02-21 LAB — URINE CULTURE: Culture: NO GROWTH

## 2024-03-31 ENCOUNTER — Ambulatory Visit
Admission: EM | Admit: 2024-03-31 | Discharge: 2024-03-31 | Disposition: A | Discharge status: Home / Self Care | Attending: Family Medicine | Admitting: Family Medicine

## 2024-03-31 DIAGNOSIS — N3001 Acute cystitis with hematuria: Secondary | ICD-10-CM | POA: Diagnosis not present

## 2024-03-31 DIAGNOSIS — R3915 Urgency of urination: Secondary | ICD-10-CM

## 2024-03-31 DIAGNOSIS — Z113 Encounter for screening for infections with a predominantly sexual mode of transmission: Secondary | ICD-10-CM | POA: Diagnosis not present

## 2024-03-31 LAB — POCT URINALYSIS DIP (MANUAL ENTRY)
Bilirubin, UA: NEGATIVE
Glucose, UA: NEGATIVE mg/dL
Ketones, POC UA: NEGATIVE mg/dL
Nitrite, UA: NEGATIVE
Protein Ur, POC: NEGATIVE mg/dL
Spec Grav, UA: 1.015 (ref 1.010–1.025)
Urobilinogen, UA: 0.2 U/dL
pH, UA: 7 (ref 5.0–8.0)

## 2024-03-31 LAB — POCT URINE PREGNANCY: Preg Test, Ur: NEGATIVE

## 2024-03-31 MED ORDER — FLUCONAZOLE 150 MG PO TABS: 150.0000 mg | ORAL_TABLET | ORAL | 0 refills | Status: DC

## 2024-03-31 MED ORDER — CIPROFLOXACIN HCL 500 MG PO TABS: 500.0000 mg | ORAL_TABLET | Freq: Two times a day (BID) | ORAL | 0 refills | Status: DC

## 2024-03-31 NOTE — Discharge Instructions (Addendum)
 Please start ciprofloxacin to address an urinary tract infection. Make sure you hydrate very well with plain water and a quantity of 80 ounces of water a day.  Please limit drinks that are considered urinary irritants such as soda, sweet tea, coffee, energy drinks, alcohol.  These can worsen your urinary and genital symptoms but also be the source of them.  I will let you know about your urine culture results through MyChart to see if we need to prescribe or change your antibiotics based off of those results.

## 2024-03-31 NOTE — ED Provider Notes (Signed)
 [facility]  Note:  This document was prepared using Conservation officer, historic buildings and may include unintentional dictation errors.  MRN: [id number] DOB: Mar 30, 1996  Subjective:   Shaylen Nephew is a 28 y.o. female presenting for 3-day history of urinary frequency, urinary urgency. Would also like sti testing. Has previously had bouts of BV and yeast infection. Denies fever, n/v, abdominal pain, pelvic pain, rashes, dysuria, hematuria, vaginal discharge.    No current facility-administered medications for this encounter.  Current Outpatient Medications:    albuterol  (PROVENTIL ) (2.5 MG/3ML) 0.083% nebulizer solution, Take 3 mLs (2.5 mg total) by nebulization every 6 (six) hours as needed for wheezing or shortness of breath., Disp: 75 mL, Rfl: 0   albuterol  (VENTOLIN  HFA) 108 (90 Base) MCG/ACT inhaler, Inhale 1-2 puffs into the lungs every 6 (six) hours as needed., Disp: 1 each, Rfl: 0   Drospirenone  (SLYND ) 4 MG TABS, Take 1 tablet (4 mg total) by mouth daily at 6 (six) AM., Disp: 90 tablet, Rfl: 3   fluconazole  (DIFLUCAN ) 150 MG tablet, Take 1 tablet (150 mg total) by mouth once a week., Disp: 2 tablet, Rfl: 0   Heating Pads PADS, 1 Pad by Does not apply route as needed., Disp: , Rfl:    metroNIDAZOLE  (FLAGYL ) 500 MG tablet, Take 1 tablet (500 mg total) by mouth 2 (two) times daily with a meal. DO NOT CONSUME ALCOHOL WHILE TAKING THIS MEDICATION., Disp: 14 tablet, Rfl: 0   montelukast  (SINGULAIR ) 10 MG tablet, Take 1 tablet (10 mg total) by mouth daily as needed (allergies)., Disp: 30 tablet, Rfl: 3   oseltamivir  (TAMIFLU ) 75 MG capsule, Take 1 capsule (75 mg total) by mouth every 12 (twelve) hours., Disp: 10 capsule, Rfl: 0   promethazine -dextromethorphan (PROMETHAZINE -DM) 6.25-15 MG/5ML syrup, Take 5 mLs by mouth 4 (four) times daily as needed for cough., Disp: 118 mL, Rfl: 0   Allergies  Allergen Reactions   Latex Itching    Past Medical History:   Diagnosis Date   Alopecia    Asthma    Headache    Hyperemesis gravidarum 08/10/2019   Infection    UTI   Postpartum hypertension 11/21/2021   Shingles    Supervision of high-risk pregnancy 03/06/2021              Nursing Staff    Provider      Office Location     [facility]    Dating     LMP      Language     English    Anatomy US      WNL with EIF       Flu Vaccine     NA    Genetic/Carrier Screen     NIPS: low risk   AFP:     Horizon: neg 4/4      TDaP Vaccine      08/07/21    Hgb A1C or   GTT    Early   Third trimester neg      COVID Vaccine    No         LAB RESULTS       Rhogam     NA    Blood T   Trichomonas infection      Past Surgical History:  Procedure Laterality Date   abortion     x2   DILATION AND EVACUATION N/A 08/01/2023   Procedure: DILATATION AND EVACUATION;  Surgeon: Raynell Caller, MD;  Location: [facility] OR;  Service: Gynecology;  Laterality: N/A;   NO PAST SURGERIES      Family History  Problem Relation Age of Onset   Hypertension Mother    Healthy Father    Asthma Maternal Grandfather    Congestive Heart Failure Maternal Grandfather     Social History   Tobacco Use   Smoking status: Former    Types: Cigarettes   Smokeless tobacco: Never   Tobacco comments:    "years ago"  Vaping Use   Vaping status: Former   Devices: stopped with +UPT  Substance Use Topics   Alcohol use: Yes    Comment: occ   Drug use: Not Currently    ROS   Objective:   Vitals: BP 118/83 (BP Location: Right Arm)   Pulse 92   Temp 97.7 F (36.5 C) (Oral)   Resp 16   LMP 03/27/2024   SpO2 98%   Physical Exam Constitutional:      General: She is not in acute distress.    Appearance: Normal appearance. She is well-developed. She is not ill-appearing, toxic-appearing or diaphoretic.  HENT:     Head: Normocephalic and atraumatic.     Nose: Nose normal.     Mouth/Throat:     Mouth: Mucous membranes are moist.     Pharynx: Oropharynx is clear.  Eyes:     General: No  scleral icterus.       Right eye: No discharge.        Left eye: No discharge.     Extraocular Movements: Extraocular movements intact.     Conjunctiva/sclera: Conjunctivae normal.  Cardiovascular:     Rate and Rhythm: Normal rate.  Pulmonary:     Effort: Pulmonary effort is normal.  Abdominal:     General: Bowel sounds are normal. There is no distension.     Palpations: Abdomen is soft. There is no mass.     Tenderness: There is no abdominal tenderness. There is no right CVA tenderness, left CVA tenderness, guarding or rebound.  Skin:    General: Skin is warm and dry.  Neurological:     General: No focal deficit present.     Mental Status: She is alert and oriented to person, place, and time.  Psychiatric:        Mood and Affect: Mood normal.        Behavior: Behavior normal.        Thought Content: Thought content normal.        Judgment: Judgment normal.     Results for orders placed or performed during the hospital encounter of 03/31/24 (from the past 24 hours)  POCT urinalysis dipstick     Status: Abnormal   Collection Time: 03/31/24  8:35 AM  Result Value Ref Range   Color, UA yellow yellow   Clarity, UA hazy (A) clear   Glucose, UA negative negative mg/dL   Bilirubin, UA negative negative   Ketones, POC UA negative negative mg/dL   Spec Grav, UA 4.098 1.191 - 1.025   Blood, UA moderate (A) negative   pH, UA 7.0 5.0 - 8.0   Protein Ur, POC negative negative mg/dL   Urobilinogen, UA 0.2 0.2 or 1.0 E.U./dL   Nitrite, UA Negative Negative   Leukocytes, UA Large (3+) (A) Negative  POCT urine pregnancy     Status: None   Collection Time: 03/31/24  8:35 AM  Result Value Ref Range   Preg Test, Ur Negative Negative    Assessment and Plan :  PDMP not reviewed this encounter.  1. Acute cystitis with hematuria   2. Urinary urgency   3. Screen for STD (sexually transmitted disease)    Start ciprofloxacin  to cover for acute cystitis, urine culture pending.   Recommended aggressive hydration, limiting urinary irritants.  Vaginal cytology pending.  Deferred BV testing given lack of vaginal discharge.  Will cover for antibiotic associated yeast infection with fluconazole .  Counseled patient on potential for adverse effects with medications prescribed/recommended today, ER and return-to-clinic precautions discussed, patient verbalized understanding.    Adolph Hoop, PA-C 03/31/24 1022

## 2024-03-31 NOTE — ED Triage Notes (Signed)
 Pt "urge to pee" x 2-3 days-also requesting STD testing and preg test-NAD-steady gait

## 2024-04-01 LAB — CERVICOVAGINAL ANCILLARY ONLY
Candida Glabrata: NEGATIVE
Candida Vaginitis: NEGATIVE
Chlamydia: NEGATIVE
Comment: NEGATIVE
Comment: NEGATIVE
Comment: NEGATIVE
Comment: NEGATIVE
Comment: NORMAL
Neisseria Gonorrhea: NEGATIVE
Trichomonas: NEGATIVE

## 2024-05-12 ENCOUNTER — Ambulatory Visit
Admission: EM | Admit: 2024-05-12 | Discharge: 2024-05-12 | Disposition: A | Discharge status: Home / Self Care | Attending: Family Medicine | Admitting: Family Medicine

## 2024-05-12 DIAGNOSIS — N760 Acute vaginitis: Secondary | ICD-10-CM | POA: Diagnosis not present

## 2024-05-12 DIAGNOSIS — B9689 Other specified bacterial agents as the cause of diseases classified elsewhere: Secondary | ICD-10-CM

## 2024-05-12 DIAGNOSIS — R102 Pelvic and perineal pain: Secondary | ICD-10-CM | POA: Diagnosis not present

## 2024-05-12 LAB — POCT URINALYSIS DIP (MANUAL ENTRY)
Bilirubin, UA: NEGATIVE
Blood, UA: NEGATIVE
Glucose, UA: NEGATIVE mg/dL
Ketones, POC UA: NEGATIVE mg/dL
Leukocytes, UA: NEGATIVE
Nitrite, UA: NEGATIVE
Protein Ur, POC: NEGATIVE mg/dL
Spec Grav, UA: 1.02 (ref 1.010–1.025)
Urobilinogen, UA: 0.2 U/dL
pH, UA: 8.5 — AB (ref 5.0–8.0)

## 2024-05-12 LAB — POCT URINE PREGNANCY: Preg Test, Ur: NEGATIVE

## 2024-05-12 MED ORDER — CLINDAMYCIN HCL 300 MG PO CAPS: 300.0000 mg | ORAL_CAPSULE | Freq: Two times a day (BID) | ORAL | 0 refills | Status: DC

## 2024-05-12 MED ORDER — FLUCONAZOLE 150 MG PO TABS: 150.0000 mg | ORAL_TABLET | ORAL | 0 refills | Status: DC

## 2024-05-12 NOTE — ED Triage Notes (Signed)
 Pt c/o abd cramps and vaginal d/c x 2 days-some relief with ibuprofen -taking amoxil x 1 week for dental issue/stopped taking yesteday-NAD-steady gait

## 2024-05-12 NOTE — ED Provider Notes (Signed)
 [facility]  Note:  This document was prepared using Conservation officer, historic buildings and may include unintentional dictation errors.  MRN: [id number] DOB: 10/19/1996  Subjective:   Eileen Graham is a 28 y.o. female presenting for 2-day history of recurrent vaginal discharge, lower abdominal/pelvic cramps.  Patient has taken some ibuprofen  with some relief.  Has been on amoxicillin for the past week, last dose that she took was yesterday.  Has a history of BV and yeast infections.  Would like STI testing.  Last episode of BV infection was in April 2025.  She took metronidazole .  This has been the typical medication that she takes orally.  Would like to consider a different medication as she has a difficult time tolerating it.  She has a gynecologist that she follows up with as well.   No current facility-administered medications for this encounter.  Current Outpatient Medications:    albuterol  (PROVENTIL ) (2.5 MG/3ML) 0.083% nebulizer solution, Take 3 mLs (2.5 mg total) by nebulization every 6 (six) hours as needed for wheezing or shortness of breath., Disp: 75 mL, Rfl: 0   albuterol  (VENTOLIN  HFA) 108 (90 Base) MCG/ACT inhaler, Inhale 1-2 puffs into the lungs every 6 (six) hours as needed., Disp: 1 each, Rfl: 0   ciprofloxacin  (CIPRO ) 500 MG tablet, Take 1 tablet (500 mg total) by mouth 2 (two) times daily., Disp: 10 tablet, Rfl: 0   Drospirenone  (SLYND ) 4 MG TABS, Take 1 tablet (4 mg total) by mouth daily at 6 (six) AM., Disp: 90 tablet, Rfl: 3   fluconazole  (DIFLUCAN ) 150 MG tablet, Take 1 tablet (150 mg total) by mouth once a week., Disp: 2 tablet, Rfl: 0   Heating Pads PADS, 1 Pad by Does not apply route as needed., Disp: , Rfl:    metroNIDAZOLE  (FLAGYL ) 500 MG tablet, Take 1 tablet (500 mg total) by mouth 2 (two) times daily with a meal. DO NOT CONSUME ALCOHOL WHILE TAKING THIS MEDICATION., Disp: 14 tablet, Rfl: 0   montelukast  (SINGULAIR ) 10 MG tablet,  Take 1 tablet (10 mg total) by mouth daily as needed (allergies)., Disp: 30 tablet, Rfl: 3   oseltamivir  (TAMIFLU ) 75 MG capsule, Take 1 capsule (75 mg total) by mouth every 12 (twelve) hours., Disp: 10 capsule, Rfl: 0   promethazine -dextromethorphan (PROMETHAZINE -DM) 6.25-15 MG/5ML syrup, Take 5 mLs by mouth 4 (four) times daily as needed for cough., Disp: 118 mL, Rfl: 0   Allergies  Allergen Reactions   Latex Itching    Past Medical History:  Diagnosis Date   Alopecia    Asthma    Headache    Hyperemesis gravidarum 08/10/2019   Infection    UTI   Postpartum hypertension 11/21/2021   Shingles    Supervision of high-risk pregnancy 03/06/2021              Nursing Staff    Provider      Office Location     [facility]    Dating     LMP      Language     English    Anatomy US      WNL with EIF       Flu Vaccine     NA    Genetic/Carrier Screen     NIPS: low risk   AFP:     Horizon: neg 4/4      TDaP Vaccine      08/07/21    Hgb A1C or   GTT  Early   Third trimester neg      COVID Vaccine    No         LAB RESULTS       Rhogam     NA    Blood T   Trichomonas infection      Past Surgical History:  Procedure Laterality Date   abortion     x2   DILATION AND EVACUATION N/A 08/01/2023   Procedure: DILATATION AND EVACUATION;  Surgeon: Izell Harari, MD;  Location: [facility] OR;  Service: Gynecology;  Laterality: N/A;   NO PAST SURGERIES      Family History  Problem Relation Age of Onset   Hypertension Mother    Healthy Father    Asthma Maternal Grandfather    Congestive Heart Failure Maternal Grandfather     Social History   Tobacco Use   Smoking status: Former    Types: Cigarettes   Smokeless tobacco: Never   Tobacco comments:    years ago  Vaping Use   Vaping status: Former   Devices: stopped with +UPT  Substance Use Topics   Alcohol use: Yes    Comment: occ   Drug use: Not Currently    ROS   Objective:   Vitals: BP (!) 143/91 (BP Location: Right Arm)   Pulse 83    Temp 98.8 F (37.1 C) (Oral)   Resp 16   LMP 04/24/2024   SpO2 96%   Physical Exam Constitutional:      General: She is not in acute distress.    Appearance: Normal appearance. She is well-developed. She is not ill-appearing, toxic-appearing or diaphoretic.  HENT:     Head: Normocephalic and atraumatic.     Nose: Nose normal.     Mouth/Throat:     Mouth: Mucous membranes are moist.     Pharynx: Oropharynx is clear.  Eyes:     General: No scleral icterus.       Right eye: No discharge.        Left eye: No discharge.     Extraocular Movements: Extraocular movements intact.     Conjunctiva/sclera: Conjunctivae normal.  Cardiovascular:     Rate and Rhythm: Normal rate.  Pulmonary:     Effort: Pulmonary effort is normal.  Abdominal:     General: Bowel sounds are normal. There is no distension.     Palpations: Abdomen is soft. There is no mass.     Tenderness: There is no abdominal tenderness. There is no right CVA tenderness, left CVA tenderness, guarding or rebound.  Skin:    General: Skin is warm and dry.  Neurological:     General: No focal deficit present.     Mental Status: She is alert and oriented to person, place, and time.  Psychiatric:        Mood and Affect: Mood normal.        Behavior: Behavior normal.        Thought Content: Thought content normal.        Judgment: Judgment normal.    Results for orders placed or performed during the hospital encounter of 05/12/24 (from the past 24 hours)  POCT urinalysis dipstick     Status: Abnormal   Collection Time: 05/12/24  4:53 PM  Result Value Ref Range   Color, UA yellow yellow   Clarity, UA clear clear   Glucose, UA negative negative mg/dL   Bilirubin, UA negative negative   Ketones, POC UA negative negative mg/dL   Spec  Grav, UA 1.020 1.010 - 1.025   Blood, UA negative negative   pH, UA 8.5 (A) 5.0 - 8.0   Protein Ur, POC negative negative mg/dL   Urobilinogen, UA 0.2 0.2 or 1.0 E.U./dL   Nitrite, UA  Negative Negative   Leukocytes, UA Negative Negative  POCT urine pregnancy     Status: None   Collection Time: 05/12/24  4:53 PM  Result Value Ref Range   Preg Test, Ur Negative Negative    Assessment and Plan :   PDMP not reviewed this encounter.  1. Bacterial vaginosis   2. Acute pelvic pain, female   3. Acute vaginitis    Patient requested empiric treatment for suspected recurrent BV.  I recommended empiric treatment for yeast infection given her recent antibiotic use.  Will cover for both.  Labs pending, follow-up with her gynecologist.  Counseled patient on potential for adverse effects with medications prescribed/recommended today, ER and return-to-clinic precautions discussed, patient verbalized understanding.    Christopher Savannah, [place] 05/12/24 1711

## 2024-05-13 ENCOUNTER — Ambulatory Visit (HOSPITAL_COMMUNITY): Payer: Self-pay

## 2024-05-13 LAB — CERVICOVAGINAL ANCILLARY ONLY
Bacterial Vaginitis (gardnerella): POSITIVE — AB
Candida Glabrata: NEGATIVE
Candida Vaginitis: NEGATIVE
Chlamydia: NEGATIVE
Comment: NEGATIVE
Comment: NEGATIVE
Comment: NEGATIVE
Comment: NEGATIVE
Comment: NEGATIVE
Comment: NORMAL
Neisseria Gonorrhea: NEGATIVE
Trichomonas: NEGATIVE

## 2024-05-13 LAB — URINE CULTURE: Culture: <10000 — AB

## 2024-07-08 ENCOUNTER — Ambulatory Visit
Admission: EM | Admit: 2024-07-08 | Discharge: 2024-07-08 | Disposition: A | Discharge status: Home / Self Care | Attending: Family Medicine | Admitting: Family Medicine

## 2024-07-08 DIAGNOSIS — N760 Acute vaginitis: Secondary | ICD-10-CM | POA: Diagnosis not present

## 2024-07-08 LAB — POCT URINE PREGNANCY: Preg Test, Ur: NEGATIVE

## 2024-07-08 NOTE — ED Provider Notes (Signed)
 [facility]  Note:  This document was prepared using Conservation officer, historic buildings and may include unintentional dictation errors.  MRN: [id number] DOB: Jun 30, 1996  Subjective:   Eileen Graham is a 28 y.o. female presenting for 4-day history of mild vaginal itching and white vaginal discharge.  Not feels like her urogenital area is normal and is concerned that she has a new sex partner.  Would like STI testing except for RPR and HIV testing.  Has a history of BV and yeast infection but prefers to avoid empiric treatment.  No current facility-administered medications for this encounter.  Current Outpatient Medications:    albuterol  (PROVENTIL ) (2.5 MG/3ML) 0.083% nebulizer solution, Take 3 mLs (2.5 mg total) by nebulization every 6 (six) hours as needed for wheezing or shortness of breath., Disp: 75 mL, Rfl: 0   albuterol  (VENTOLIN  HFA) 108 (90 Base) MCG/ACT inhaler, Inhale 1-2 puffs into the lungs every 6 (six) hours as needed., Disp: 1 each, Rfl: 0   ciprofloxacin  (CIPRO ) 500 MG tablet, Take 1 tablet (500 mg total) by mouth 2 (two) times daily., Disp: 10 tablet, Rfl: 0   clindamycin  (CLEOCIN ) 300 MG capsule, Take 1 capsule (300 mg total) by mouth 2 (two) times daily., Disp: 14 capsule, Rfl: 0   Drospirenone  (SLYND ) 4 MG TABS, Take 1 tablet (4 mg total) by mouth daily at 6 (six) AM., Disp: 90 tablet, Rfl: 3   fluconazole  (DIFLUCAN ) 150 MG tablet, Take 1 tablet (150 mg total) by mouth every 3 (three) days., Disp: 5 tablet, Rfl: 0   Heating Pads PADS, 1 Pad by Does not apply route as needed., Disp: , Rfl:    montelukast  (SINGULAIR ) 10 MG tablet, Take 1 tablet (10 mg total) by mouth daily as needed (allergies)., Disp: 30 tablet, Rfl: 3   oseltamivir  (TAMIFLU ) 75 MG capsule, Take 1 capsule (75 mg total) by mouth every 12 (twelve) hours., Disp: 10 capsule, Rfl: 0   promethazine -dextromethorphan (PROMETHAZINE -DM) 6.25-15 MG/5ML syrup, Take 5 mLs by mouth 4 (four)  times daily as needed for cough., Disp: 118 mL, Rfl: 0   Allergies  Allergen Reactions   Latex Itching    Past Medical History:  Diagnosis Date   Alopecia    Asthma    Headache    Hyperemesis gravidarum 08/10/2019   Infection    UTI   Postpartum hypertension 11/21/2021   Shingles    Supervision of high-risk pregnancy 03/06/2021              Nursing Staff    Provider      Office Location     [facility]    Dating     LMP      Language     English    Anatomy US      WNL with EIF       Flu Vaccine     NA    Genetic/Carrier Screen     NIPS: low risk   AFP:     Horizon: neg 4/4      TDaP Vaccine      08/07/21    Hgb A1C or   GTT    Early   Third trimester neg      COVID Vaccine    No         LAB RESULTS       Rhogam     NA    Blood T   Trichomonas infection      Past Surgical History:  Procedure Laterality Date   abortion     x2   DILATION AND EVACUATION N/A 08/01/2023   Procedure: DILATATION AND EVACUATION;  Surgeon: Izell Harari, MD;  Location: [facility] OR;  Service: Gynecology;  Laterality: N/A;   NO PAST SURGERIES      Family History  Problem Relation Age of Onset   Hypertension Mother    Healthy Father    Asthma Maternal Grandfather    Congestive Heart Failure Maternal Grandfather     Social History   Tobacco Use   Smoking status: Former    Types: Cigarettes   Smokeless tobacco: Never   Tobacco comments:    years ago  Vaping Use   Vaping status: Former   Devices: stopped with +UPT  Substance Use Topics   Alcohol use: Yes    Comment: occ   Drug use: Not Currently    ROS   Objective:   Vitals: BP 137/86 (BP Location: Right Arm)   Pulse 90   Temp 97.6 F (36.4 C) (Oral)   Resp 16   LMP 06/25/2024 (Exact Date)   SpO2 98%   Physical Exam Constitutional:      General: She is not in acute distress.    Appearance: Normal appearance. She is well-developed. She is not ill-appearing, toxic-appearing or diaphoretic.  HENT:     Head: Normocephalic and  atraumatic.     Nose: Nose normal.     Mouth/Throat:     Mouth: Mucous membranes are moist.  Eyes:     General: No scleral icterus.       Right eye: No discharge.        Left eye: No discharge.     Extraocular Movements: Extraocular movements intact.  Cardiovascular:     Rate and Rhythm: Normal rate.  Pulmonary:     Effort: Pulmonary effort is normal.  Skin:    General: Skin is warm and dry.  Neurological:     General: No focal deficit present.     Mental Status: She is alert and oriented to person, place, and time.  Psychiatric:        Mood and Affect: Mood normal.        Behavior: Behavior normal.     Results for orders placed or performed during the hospital encounter of 07/08/24 (from the past 24 hours)  POCT urine pregnancy     Status: None   Collection Time: 07/08/24 11:55 AM  Result Value Ref Range   Preg Test, Ur Negative Negative    Assessment and Plan :   PDMP not reviewed this encounter.  1. Acute vaginitis    Declined empiric treatment and I am in agreement.  Will notify of results and treat as appropriate based off of that.   Christopher Savannah, [place] 07/08/24 1219

## 2024-07-08 NOTE — Discharge Instructions (Addendum)
 Your test results should be available tomorrow.  Our results nurse will contact you by phone or through MyChart and get you prescriptions for any infections you test positive for.

## 2024-07-08 NOTE — ED Triage Notes (Signed)
 Pt requested STD's test as she has a new partner and she is feeling off x 4 days. Reports mild vaginal itching and white vaginal discharge x 4 days.

## 2024-07-09 ENCOUNTER — Ambulatory Visit (HOSPITAL_COMMUNITY): Payer: Self-pay

## 2024-07-09 LAB — CERVICOVAGINAL ANCILLARY ONLY
Bacterial Vaginitis (gardnerella): POSITIVE — AB
Candida Glabrata: NEGATIVE
Candida Vaginitis: NEGATIVE
Chlamydia: NEGATIVE
Comment: NEGATIVE
Comment: NEGATIVE
Comment: NEGATIVE
Comment: NEGATIVE
Comment: NEGATIVE
Comment: NORMAL
Neisseria Gonorrhea: NEGATIVE
Trichomonas: POSITIVE — AB

## 2024-07-09 MED ORDER — FLUCONAZOLE 150 MG PO TABS: 150.0000 mg | ORAL_TABLET | Freq: Once | ORAL | 0 refills | Status: AC

## 2024-07-09 MED ORDER — METRONIDAZOLE 500 MG PO TABS: 500.0000 mg | ORAL_TABLET | Freq: Two times a day (BID) | ORAL | 0 refills | Status: AC

## 2024-09-16 ENCOUNTER — Encounter: Payer: Self-pay | Admitting: Obstetrics and Gynecology

## 2024-10-07 ENCOUNTER — Other Ambulatory Visit: Payer: Self-pay

## 2024-10-07 ENCOUNTER — Ambulatory Visit
Admission: EM | Admit: 2024-10-07 | Discharge: 2024-10-07 | Disposition: A | Discharge status: Home / Self Care | Attending: Family Medicine | Admitting: Family Medicine

## 2024-10-07 DIAGNOSIS — N898 Other specified noninflammatory disorders of vagina: Secondary | ICD-10-CM | POA: Diagnosis present

## 2024-10-07 DIAGNOSIS — J029 Acute pharyngitis, unspecified: Secondary | ICD-10-CM | POA: Insufficient documentation

## 2024-10-07 DIAGNOSIS — J4521 Mild intermittent asthma with (acute) exacerbation: Secondary | ICD-10-CM | POA: Insufficient documentation

## 2024-10-07 DIAGNOSIS — R051 Acute cough: Secondary | ICD-10-CM | POA: Diagnosis not present

## 2024-10-07 DIAGNOSIS — J069 Acute upper respiratory infection, unspecified: Secondary | ICD-10-CM | POA: Diagnosis present

## 2024-10-07 DIAGNOSIS — Z113 Encounter for screening for infections with a predominantly sexual mode of transmission: Secondary | ICD-10-CM | POA: Insufficient documentation

## 2024-10-07 LAB — POCT URINE DIPSTICK
Glucose, UA: NEGATIVE mg/dL
Leukocytes, UA: NEGATIVE
Nitrite, UA: NEGATIVE
POC PROTEIN,UA: NEGATIVE
Spec Grav, UA: 1.02 (ref 1.010–1.025)
Urobilinogen, UA: 0.2 U/dL
pH, UA: 6.5 (ref 5.0–8.0)

## 2024-10-07 LAB — POC COVID19/FLU A&B COMBO
Covid Antigen, POC: NEGATIVE
Influenza A Antigen, POC: NEGATIVE
Influenza B Antigen, POC: NEGATIVE

## 2024-10-07 LAB — POCT URINE PREGNANCY: Preg Test, Ur: NEGATIVE

## 2024-10-07 LAB — POCT RAPID STREP A (OFFICE): Rapid Strep A Screen: NEGATIVE

## 2024-10-07 MED ORDER — ALBUTEROL SULFATE (2.5 MG/3ML) 0.083% IN NEBU: 2.5000 mg | INHALATION_SOLUTION | Freq: Four times a day (QID) | RESPIRATORY_TRACT | 1 refills | Status: DC | PRN

## 2024-10-07 MED ORDER — BENZONATATE 200 MG PO CAPS: 200.0000 mg | ORAL_CAPSULE | Freq: Three times a day (TID) | ORAL | 0 refills | Status: AC | PRN

## 2024-10-07 MED ORDER — IPRATROPIUM-ALBUTEROL 0.5-2.5 (3) MG/3ML IN SOLN
3.0000 mL | Freq: Once | RESPIRATORY_TRACT | Status: AC
  Administered 2024-10-07: 3 mL via RESPIRATORY_TRACT

## 2024-10-07 MED ORDER — ALBUTEROL SULFATE HFA 108 (90 BASE) MCG/ACT IN AERS: 1.0000 | INHALATION_SPRAY | Freq: Four times a day (QID) | RESPIRATORY_TRACT | 1 refills | Status: DC | PRN

## 2024-10-07 MED ORDER — PREDNISONE 20 MG PO TABS: 40.0000 mg | ORAL_TABLET | Freq: Every day | ORAL | 0 refills | Status: AC

## 2024-10-07 NOTE — Discharge Instructions (Addendum)
 You tested negative for COVID, flu, and strep throat.  I refilled your albuterol  inhaler and home nebulizer solution.  Start prednisone  daily for 5 days for your asthma symptoms.  Tessalon 3 times a day as needed for your cough.  The clinic will contact you with the results of the vaginal swab/STD testing done today if it is positive.  Lots of rest and fluids.  Please follow-up with your PCP in 2 to 3 days for recheck.  Please go to the ER for any worsening symptoms.  Hope you feel better soon!

## 2024-10-07 NOTE — ED Provider Notes (Addendum)
 [facility]    CSN: [id number] Arrival date & time: 10/07/24  0809      History   Chief Complaint Chief Complaint  Patient presents with   Cough   Headache   Shortness of Breath   Vaginal Discharge   Generalized Body Aches    HPI Eileen Graham is a 28 y.o. female  presents for evaluation of URI symptoms for 3 days. Patient reports associated symptoms of cough, congestion, sore throat, diarrhea, wheezing and shortness of breath. Denies fevers, vomiting, ear pain, body aches.  patient does have a hx of asthma.  Is out of her inhaler but has a home nebulizer which she used prior to arrival with temporary improvement in symptoms.  Patient is not an active smoker.   Reports  sick contacts via her children.  Pt has taken Mucinex OTC for symptoms.  In addition she reports 3 to 4 days of a nonpruritic non-malodorous vaginal discharge.  Denies any dysuria, hematuria, fevers, flank pain.  No known STD exposure but would like screening.  Has a history of BV but does not know if the symptoms are consistent with previous BV infections.  Pt has no other concerns at this time.    Cough Associated symptoms: shortness of breath, sore throat and wheezing   Headache Associated symptoms: congestion, cough, diarrhea and sore throat   Shortness of Breath Associated symptoms: cough, sore throat and wheezing   Vaginal Discharge   Past Medical History:  Diagnosis Date   Alopecia    Asthma    Headache    Hyperemesis gravidarum 08/10/2019   Infection    UTI   Postpartum hypertension 11/21/2021   Shingles    Supervision of high-risk pregnancy 03/06/2021              Nursing Staff    Provider      Office Location     [facility]    Dating     LMP      Language     English    Anatomy US      WNL with EIF       Flu Vaccine     NA    Genetic/Carrier Screen     NIPS: low risk   AFP:     Horizon: neg 4/4      TDaP Vaccine      08/07/21    Hgb A1C or   GTT    Early   Third trimester neg       COVID Vaccine    No         LAB RESULTS       Rhogam     NA    Blood T   Trichomonas infection     Patient Active Problem List   Diagnosis Date Noted   Transient hypertension 09/09/2023   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 Authorizing Provider  albuterol  (PROVENTIL ) (2.5 MG/3ML) 0.083% nebulizer solution Take 3 mLs (2.5 mg total) by nebulization every 6 (six) hours as needed for wheezing or shortness of breath. 10/07/24  Yes Makaila Windle, Jodi R, NP  albuterol  (VENTOLIN  HFA) 108 (90 Base) MCG/ACT inhaler Inhale 1-2 puffs into the lungs every 6 (six) hours as needed. 10/07/24  Yes Marna Weniger, Jodi R, NP  benzonatate (TESSALON) 200 MG capsule Take 1 capsule (200 mg total) by mouth 3 (three) times daily as needed. 10/07/24  Yes Sani Loiseau, Jodi R, NP  predniSONE  (DELTASONE ) 20 MG tablet Take 2 tablets (40 mg total) by mouth daily with breakfast for 5 days. 10/07/24 10/12/24 Yes Ryiah Bellissimo, Jodi R, NP  albuterol  (PROVENTIL ) (2.5 MG/3ML) 0.083% nebulizer solution Take 3 mLs (2.5 mg total) by nebulization every 6 (six) hours as needed for wheezing or shortness of breath. 12/03/23   Loreda Myla SAUNDERS, NP  Heating Pads PADS 1 Pad by Does not apply route as needed. 08/01/23   Izell Harari, MD  montelukast  (SINGULAIR ) 10 MG tablet Take 1 tablet (10 mg total) by mouth daily as needed (allergies). 09/09/23   Izell Harari, MD  oseltamivir   (TAMIFLU ) 75 MG capsule Take 1 capsule (75 mg total) by mouth every 12 (twelve) hours. 12/03/23   Zakira Ressel, Jodi R, NP  famotidine  (PEPCID ) 40 MG tablet Take 1 tablet (40 mg total) by mouth every evening. 08/09/19 06/12/20  Nugent, Nat BRAVO, NP  glycopyrrolate  (ROBINUL ) 2 MG tablet Take 1 tablet (2 mg total) by mouth 3 (three) times daily as needed. 08/07/19 06/12/20  Leftwich-Kirby, Olam LABOR, CNM  loratadine  (CLARITIN ) 10 MG tablet Take 1 tablet (10 mg total) by mouth daily. 02/19/19 06/12/20  Wieters, Hallie C, PA-C  metoCLOPramide  (REGLAN ) 10 MG tablet Take 1 tablet (10 mg total) by mouth 3 (three) times daily with meals. 08/09/19 06/12/20  Nugent, Nat BRAVO, NP  potassium chloride  (KLOR-CON ) 10 MEQ tablet Take 1 tablet (10 mEq total) by mouth daily. 08/09/19 06/12/20  Nugent, Nat BRAVO, NP    Family History Family History  Problem Relation Age of Onset   Hypertension Mother    Healthy Father    Asthma Maternal Grandfather    Congestive Heart Failure Maternal Grandfather     Social History Social History   Tobacco Use   Smoking status: Former    Types: Cigarettes   Smokeless tobacco: Never   Tobacco comments:    years ago  Vaping Use   Vaping status: Former   Devices: stopped with +UPT  Substance Use Topics   Alcohol use: Yes    Comment: occ   Drug use: Not Currently     Allergies   Latex   Review of Systems Review of Systems  HENT:  Positive for congestion and sore throat.   Respiratory:  Positive for cough, shortness of breath and wheezing.   Gastrointestinal:  Positive for diarrhea.  Genitourinary:  Positive for vaginal discharge.     Physical Exam Triage Vital Signs ED Triage Vitals  Encounter Vitals Group     BP 10/07/24 0822 122/78     Girls Systolic BP Percentile --      Girls Diastolic BP Percentile --      Boys Systolic  BP Percentile --      Boys Diastolic BP Percentile --      Pulse Rate 10/07/24 0822 99     Resp 10/07/24 0822 18     Temp 10/07/24 0822 98.8 F  (37.1 C)     Temp Source 10/07/24 0822 Oral     SpO2 10/07/24 0822 95 %     Weight --      Height --      Head Circumference --      Peak Flow --      Pain Score 10/07/24 0819 5     Pain Loc --      Pain Education --      Exclude from Growth Chart --    No data found.  Updated Vital Signs BP 122/78   Pulse 99   Temp 98.8 F (37.1 C) (Oral)   Resp 18   LMP 09/21/2024   SpO2 95%   Visual Acuity Right Eye Distance:   Left Eye Distance:   Bilateral Distance:    Right Eye Near:   Left Eye Near:    Bilateral Near:     Physical Exam Vitals and nursing note reviewed.  Constitutional:      General: She is not in acute distress.    Appearance: She is well-developed. She is not ill-appearing.  HENT:     Head: Normocephalic and atraumatic.     Right Ear: Tympanic membrane and ear canal normal.     Left Ear: Tympanic membrane and ear canal normal.     Nose: Congestion present.     Mouth/Throat:     Mouth: Mucous membranes are moist.     Pharynx: Oropharynx is clear. Uvula midline. Posterior oropharyngeal erythema present.     Tonsils: No tonsillar exudate or tonsillar abscesses.  Eyes:     Conjunctiva/sclera: Conjunctivae normal.     Pupils: Pupils are equal, round, and reactive to light.  Cardiovascular:     Rate and Rhythm: Normal rate and regular rhythm.     Heart sounds: Normal heart sounds.  Pulmonary:     Effort: Pulmonary effort is normal.     Breath sounds: Wheezing present. No rhonchi or rales.     Comments: Mild expiratory wheezing right lower base Musculoskeletal:     Cervical back: Normal range of motion and neck supple.  Lymphadenopathy:     Cervical: No cervical adenopathy.  Skin:    General: Skin is warm and dry.  Neurological:     General: No focal deficit present.     Mental Status: She is alert and oriented to person, place, and time.  Psychiatric:        Mood and Affect: Mood normal.        Behavior: Behavior normal.      UC Treatments /  Results  Labs (all labs ordered are listed, but only abnormal results are displayed) Labs Reviewed  POCT URINE DIPSTICK - Abnormal; Notable for the following components:      Result Value   Clarity, UA hazy (*)    Bilirubin, UA small (*)    Ketones, POC UA small (15) (*)    Blood, UA trace-lysed (*)    All other components within normal limits  POCT RAPID STREP A (OFFICE)  POC COVID19/FLU A&B COMBO  POCT URINE PREGNANCY  CERVICOVAGINAL ANCILLARY ONLY    EKG   Radiology No results found.  Procedures Procedures (including critical care time)  Medications Ordered in UC Medications  ipratropium-albuterol  (DUONEB)  0.5-2.5 (3) MG/3ML nebulizer solution 3 mL (has no administration in time range)    Initial Impression / Assessment and Plan / UC Course  I have reviewed the triage vital signs and the nursing notes.  Pertinent labs & imaging results that were available during my care of the patient were reviewed by me and considered in my medical decision making (see chart for details).     Reviewed exam and symptoms with patient.  Discussed viral illness and asthma exacerbation.  She was given DuoNeb in clinic with improvement in symptoms.  Did refill her albuterol  inhaler and home nebulizer solution which she has been out of.  Will do prednisone  daily for 5 days.  Tessalon as needed for cough.  UA and urine hCG negative. Vaginal swab/STD testing is ordered and will contact for any positive results.  Will await these results prior to initiating any treatment.  Current stress fluids and PCP follow-up if symptoms do not improve.  ER precautions reviewed Final Clinical Impressions(s) / UC Diagnoses   Final diagnoses:  Acute cough  Sore throat  Vaginal discharge  Screening examination for STD (sexually transmitted disease)  Mild intermittent asthma with acute exacerbation  Viral upper respiratory illness     Discharge Instructions      You tested negative for COVID, flu, and  strep throat.  I refilled your albuterol  inhaler and home nebulizer solution.  Start prednisone  daily for 5 days for your asthma symptoms.  Tessalon 3 times a day as needed for your cough.  The clinic will contact you with the results of the vaginal swab/STD testing done today if it is positive.  Lots of rest and fluids.  Please follow-up with your PCP in 2 to 3 days for recheck.  Please go to the ER for any worsening symptoms.  Hope you feel better soon!     ED Prescriptions     Medication Sig Dispense Auth. Provider   benzonatate (TESSALON) 200 MG capsule Take 1 capsule (200 mg total) by mouth 3 (three) times daily as needed. 20 capsule Conception Doebler, Jodi R, NP   predniSONE  (DELTASONE ) 20 MG tablet Take 2 tablets (40 mg total) by mouth daily with breakfast for 5 days. 10 tablet Monika Chestang, Jodi R, NP   albuterol  (VENTOLIN  HFA) 108 (90 Base) MCG/ACT inhaler Inhale 1-2 puffs into the lungs every 6 (six) hours as needed. 1 each Taniya Dasher, Jodi R, NP   albuterol  (PROVENTIL ) (2.5 MG/3ML) 0.083% nebulizer solution Take 3 mLs (2.5 mg total) by nebulization every 6 (six) hours as needed for wheezing or shortness of breath. 75 mL Porter Nakama, Jodi R, NP      PDMP not reviewed this encounter.   Loreda Myla SAUNDERS, NP 10/07/24 0908    Loreda Myla SAUNDERS, NP 10/07/24 [phone]

## 2024-10-07 NOTE — ED Triage Notes (Signed)
 Pt c/o pelvic pressure, white vaginal dischargex4d. Pt c/o productive cough w/yellow mucous, HA, bodyaches, SOBx3d.

## 2024-10-08 LAB — CERVICOVAGINAL ANCILLARY ONLY
Bacterial Vaginitis (gardnerella): NEGATIVE
Candida Glabrata: NEGATIVE
Candida Vaginitis: NEGATIVE
Chlamydia: NEGATIVE
Comment: NEGATIVE
Comment: NEGATIVE
Comment: NEGATIVE
Comment: NEGATIVE
Comment: NEGATIVE
Comment: NORMAL
Neisseria Gonorrhea: NEGATIVE
Trichomonas: NEGATIVE

## 2024-11-05 ENCOUNTER — Ambulatory Visit
Admission: EM | Admit: 2024-11-05 | Discharge: 2024-11-05 | Disposition: A | Discharge status: Home / Self Care | Attending: Family Medicine | Admitting: Family Medicine

## 2024-11-05 DIAGNOSIS — J4531 Mild persistent asthma with (acute) exacerbation: Secondary | ICD-10-CM | POA: Diagnosis not present

## 2024-11-05 DIAGNOSIS — J4521 Mild intermittent asthma with (acute) exacerbation: Secondary | ICD-10-CM | POA: Diagnosis not present

## 2024-11-05 DIAGNOSIS — R0602 Shortness of breath: Secondary | ICD-10-CM

## 2024-11-05 DIAGNOSIS — J101 Influenza due to other identified influenza virus with other respiratory manifestations: Secondary | ICD-10-CM | POA: Diagnosis not present

## 2024-11-05 LAB — POC COVID19/FLU A&B COMBO
Covid Antigen, POC: NEGATIVE
Influenza A Antigen, POC: POSITIVE — AB
Influenza B Antigen, POC: NEGATIVE

## 2024-11-05 MED ORDER — ALBUTEROL SULFATE (2.5 MG/3ML) 0.083% IN NEBU: 2.5000 mg | INHALATION_SOLUTION | Freq: Four times a day (QID) | RESPIRATORY_TRACT | 0 refills | Status: AC | PRN

## 2024-11-05 MED ORDER — PREDNISONE 20 MG PO TABS: ORAL_TABLET | ORAL | 0 refills | Status: AC

## 2024-11-05 MED ORDER — OSELTAMIVIR PHOSPHATE 75 MG PO CAPS: 75.0000 mg | ORAL_CAPSULE | Freq: Two times a day (BID) | ORAL | 0 refills | Status: AC

## 2024-11-05 MED ORDER — ALBUTEROL SULFATE HFA 108 (90 BASE) MCG/ACT IN AERS: 1.0000 | INHALATION_SPRAY | Freq: Four times a day (QID) | RESPIRATORY_TRACT | 0 refills | Status: AC | PRN

## 2024-11-05 MED ORDER — PROMETHAZINE-DM 6.25-15 MG/5ML PO SYRP: 5.0000 mL | ORAL_SOLUTION | Freq: Three times a day (TID) | ORAL | 0 refills | Status: AC | PRN

## 2024-11-05 NOTE — ED Provider Notes (Signed)
 " [facility]  Note:  This document was prepared using Conservation officer, historic buildings and may include unintentional dictation errors.  MRN: [id number] DOB: Aug 13, 1996  Subjective:   Eileen Graham is a 29 y.o. female presenting for 2-day history of body pains, shortness of breath, fevers, malaise and fatigue, productive cough.  Has a history of asthma and has needed to use her albuterol  inhaler.  Needs refills of her nebulizer treatment.  She did take leftover prednisone  with some relief.  Has had multiple sick contacts at home including her 2 children who present today to be seen as well with the same symptoms.  No smoking of any kind including cigarettes, cigars, vaping, marijuana use.    Current Outpatient Medications  Medication Instructions   albuterol  (PROVENTIL ) 2.5 mg, Nebulization, Every 6 hours PRN   albuterol  (PROVENTIL ) 2.5 mg, Nebulization, Every 6 hours PRN   albuterol  (VENTOLIN  HFA) 108 (90 Base) MCG/ACT inhaler 1-2 puffs, Inhalation, Every 6 hours PRN   benzonatate  (TESSALON ) 200 mg, Oral, 3 times daily PRN   Heating Pads PADS 1 Pad, Does not apply, As needed   montelukast  (SINGULAIR ) 10 mg, Oral, Daily PRN   oseltamivir  (TAMIFLU ) 75 mg, Oral, Every 12 hours    Allergies[1]  Past Medical History:  Diagnosis Date   Alopecia    Asthma    Headache    Hyperemesis gravidarum 08/10/2019   Infection    UTI   Postpartum hypertension 11/21/2021   Shingles    Supervision of high-risk pregnancy 03/06/2021              Nursing Staff    Provider      Office Location     [facility]    Dating     LMP      Language     English    Anatomy US      WNL with EIF       Flu Vaccine     NA    Genetic/Carrier Screen     NIPS: low risk   AFP:     Horizon: neg 4/4      TDaP Vaccine      08/07/21    Hgb A1C or   GTT    Early   Third trimester neg      COVID Vaccine    No         LAB RESULTS       Rhogam     NA    Blood T   Trichomonas infection      Past  Surgical History:  Procedure Laterality Date   abortion     x2   DILATION AND EVACUATION N/A 08/01/2023   Procedure: DILATATION AND EVACUATION;  Surgeon: Izell Harari, MD;  Location: [facility] OR;  Service: Gynecology;  Laterality: N/A;   NO PAST SURGERIES      Family History  Problem Relation Age of Onset   Hypertension Mother    Healthy Father    Asthma Maternal Grandfather    Congestive Heart Failure Maternal Grandfather     Social History   Occupational History   Not on file  Tobacco Use   Smoking status: Former    Types: Cigarettes   Smokeless tobacco: Never   Tobacco comments:    years ago  Vaping Use   Vaping status: Former   Devices: stopped with +UPT  Substance and Sexual Activity   Alcohol use: Yes    Comment: occ   Drug  use: Not Currently   Sexual activity: Yes    Birth control/protection: None     ROS   Objective:   Vitals: BP 120/86 (BP Location: Left Arm)   Pulse (S) (!) 127 Comment: states she used her breathing treatment before coming in  Temp 99.3 F (37.4 C) (Oral)   Resp 18   LMP 10/15/2024 (Approximate)   SpO2 95%   Physical Exam Constitutional:      General: She is not in acute distress.    Appearance: Normal appearance. She is well-developed and normal weight. She is not ill-appearing, toxic-appearing or diaphoretic.  HENT:     Head: Normocephalic and atraumatic.     Right Ear: Tympanic membrane, ear canal and external ear normal. No drainage or tenderness. No middle ear effusion. There is no impacted cerumen. Tympanic membrane is not injected, perforated, erythematous or bulging.     Left Ear: Tympanic membrane, ear canal and external ear normal. No drainage or tenderness.  No middle ear effusion. There is no impacted cerumen. Tympanic membrane is not injected, perforated, erythematous or bulging.     Nose: Nose normal. No congestion or rhinorrhea.     Mouth/Throat:     Mouth: Mucous membranes are moist. No oral lesions.     Pharynx:  No pharyngeal swelling, oropharyngeal exudate, posterior oropharyngeal erythema or uvula swelling.     Tonsils: No tonsillar exudate or tonsillar abscesses. 0 on the right. 0 on the left.  Eyes:     General: No scleral icterus.       Right eye: No discharge.        Left eye: No discharge.     Extraocular Movements: Extraocular movements intact.     Right eye: Normal extraocular motion.     Left eye: Normal extraocular motion.     Conjunctiva/sclera: Conjunctivae normal.  Cardiovascular:     Rate and Rhythm: Regular rhythm. Tachycardia present.     Heart sounds: Normal heart sounds. No murmur heard.    No friction rub. No gallop.  Pulmonary:     Effort: Pulmonary effort is normal. No respiratory distress.     Breath sounds: No stridor. Wheezing (trace) present. No decreased breath sounds, rhonchi or rales.  Chest:     Chest wall: No tenderness.  Musculoskeletal:     Cervical back: Normal range of motion and neck supple.  Lymphadenopathy:     Cervical: No cervical adenopathy.  Skin:    General: Skin is warm and dry.  Neurological:     General: No focal deficit present.     Mental Status: She is alert and oriented to person, place, and time.  Psychiatric:        Mood and Affect: Mood normal.        Behavior: Behavior normal.     Results for orders placed or performed during the hospital encounter of 11/05/24 (from the past 24 hours)  POC Covid19/Flu A&B Antigen     Status: Abnormal   Collection Time: 11/05/24 10:52 AM  Result Value Ref Range   Influenza A Antigen, POC Positive (A) Negative   Influenza B Antigen, POC Negative Negative   Covid Antigen, POC Negative Negative    Assessment and Plan :   PDMP not reviewed this encounter.  1. Influenza A   2. Shortness of breath   3. Mild persistent allergic asthma with acute exacerbation   4. Mild intermittent asthma with acute exacerbation      Patient had tachycardia likely related to using  her albuterol  right before  coming into the clinic.  This may be exacerbated by her influenza A illness and the recent use of leftover prednisone .  Recommend treating for influenza A with Tamiflu .  I refilled her nebulized albuterol , albuterol  inhaler and recommend an oral prednisone  course.  Will defer imaging given clear pulmonary exam.  Counseled patient on potential for adverse effects with medications prescribed/recommended today, ER and return-to-clinic precautions discussed, patient verbalized understanding.     [1]  Allergies Allergen Reactions   Latex Itching     Christopher Savannah, PA-C 11/05/24 1359  "

## 2024-11-05 NOTE — Discharge Instructions (Addendum)
 Start Tamiflu  for Flu A. We will manage this as a viral syndrome. For sore throat or cough try using a honey-based tea. Use 3 teaspoons of honey with juice squeezed from half lemon. Place shaved pieces of ginger into 1/2-1 cup of water and warm over stove top. Then mix the ingredients and repeat every 4 hours as needed. Please take Tylenol  500mg -650mg  once every 6 hours for fevers, aches and pains. Hydrate very well with at least 2 liters (64 ounces) of water. Eat light meals such as soups (chicken and noodles, chicken wild rice, vegetable).  Do not eat any foods that you are allergic to.  Start an antihistamine like Zyrtec (10mg  daily) for postnasal drainage, sinus congestion.  You can take this together with prednisone  and your albuterol  treatments. Use cough medications as needed.

## 2024-11-05 NOTE — ED Triage Notes (Signed)
 Patient presents to UC for cough, chest pain when coughing or taking a deep breath, SOB, body aches x weds. Reports back pain since Tuesday. Treating with her montelukast , neb treatment, and 2 doses of prednisone  from left Rx.
